# Patient Record
Sex: Female | Born: 1975 | Race: White | Hispanic: No | Marital: Married | Smoking: Current every day smoker
Health system: Southern US, Academic
[De-identification: ages and names within clinical notes are randomized; demographics above are authoritative.]

## PROBLEM LIST (undated history)

## (undated) DIAGNOSIS — K602 Anal fissure, unspecified: Secondary | ICD-10-CM

## (undated) DIAGNOSIS — R001 Bradycardia, unspecified: Secondary | ICD-10-CM

## (undated) DIAGNOSIS — Z973 Presence of spectacles and contact lenses: Secondary | ICD-10-CM

## (undated) DIAGNOSIS — F32A Depression, unspecified: Secondary | ICD-10-CM

## (undated) DIAGNOSIS — E039 Hypothyroidism, unspecified: Secondary | ICD-10-CM

## (undated) DIAGNOSIS — Z853 Personal history of malignant neoplasm of breast: Secondary | ICD-10-CM

## (undated) DIAGNOSIS — J45909 Unspecified asthma, uncomplicated: Secondary | ICD-10-CM

## (undated) DIAGNOSIS — R000 Tachycardia, unspecified: Secondary | ICD-10-CM

## (undated) DIAGNOSIS — J309 Allergic rhinitis, unspecified: Secondary | ICD-10-CM

## (undated) DIAGNOSIS — R0602 Shortness of breath: Secondary | ICD-10-CM

## (undated) DIAGNOSIS — G4730 Sleep apnea, unspecified: Secondary | ICD-10-CM

## (undated) DIAGNOSIS — I499 Cardiac arrhythmia, unspecified: Secondary | ICD-10-CM

## (undated) DIAGNOSIS — E079 Disorder of thyroid, unspecified: Secondary | ICD-10-CM

## (undated) DIAGNOSIS — K219 Gastro-esophageal reflux disease without esophagitis: Secondary | ICD-10-CM

## (undated) DIAGNOSIS — T148XXA Other injury of unspecified body region, initial encounter: Secondary | ICD-10-CM

## (undated) DIAGNOSIS — Z87898 Personal history of other specified conditions: Secondary | ICD-10-CM

## (undated) DIAGNOSIS — G4710 Hypersomnia, unspecified: Secondary | ICD-10-CM

## (undated) DIAGNOSIS — I10 Essential (primary) hypertension: Secondary | ICD-10-CM

## (undated) DIAGNOSIS — G4700 Insomnia, unspecified: Secondary | ICD-10-CM

## (undated) DIAGNOSIS — E669 Obesity, unspecified: Secondary | ICD-10-CM

## (undated) DIAGNOSIS — Z9989 Dependence on other enabling machines and devices: Secondary | ICD-10-CM

## (undated) HISTORY — DX: Gastro-esophageal reflux disease without esophagitis: K21.9

## (undated) HISTORY — PX: HX TONSILLECTOMY: SHX27

## (undated) HISTORY — DX: Personal history of malignant neoplasm of breast: Z85.3

## (undated) HISTORY — DX: Other injury of unspecified body region, initial encounter: T14.8XXA

## (undated) HISTORY — DX: Presence of spectacles and contact lenses: Z97.3

## (undated) HISTORY — DX: Hypersomnia, unspecified: G47.10

## (undated) HISTORY — PX: HX LITHOTRIPSY: SHX66

## (undated) HISTORY — DX: Sleep apnea, unspecified: G47.30

## (undated) HISTORY — DX: Unspecified asthma, uncomplicated: J45.909

## (undated) HISTORY — PX: PORTACATH PLACEMENT: SHX2246

## (undated) HISTORY — DX: Dependence on other enabling machines and devices: Z99.89

## (undated) HISTORY — DX: Personal history of other specified conditions: Z87.898

## (undated) HISTORY — PX: HX TUBAL LIGATION: SHX77

## (undated) HISTORY — DX: Depression, unspecified: F32.A

## (undated) HISTORY — DX: Obesity, unspecified: E66.9

## (undated) HISTORY — DX: Anal fissure, unspecified: K60.2

## (undated) HISTORY — DX: Bradycardia, unspecified: R00.1

## (undated) HISTORY — DX: Shortness of breath: R06.02

## (undated) HISTORY — DX: Insomnia, unspecified: G47.00

## (undated) HISTORY — PX: HX HYSTERECTOMY: SHX81

## (undated) HISTORY — DX: Tachycardia, unspecified: R00.0

## (undated) HISTORY — DX: Essential (primary) hypertension: I10

## (undated) HISTORY — DX: Allergic rhinitis, unspecified: J30.9

## (undated) HISTORY — DX: Cardiac arrhythmia, unspecified: I49.9

## (undated) HISTORY — DX: Hypothyroidism, unspecified: E03.9

## (undated) HISTORY — PX: HX BREAST BIOPSY: SHX20

## (undated) HISTORY — DX: Disorder of thyroid, unspecified: E07.9

## (undated) HISTORY — PX: HX HAND SURGERY: 2100001299

## (undated) HISTORY — PX: HX BACK SURGERY: SHX140

## (undated) NOTE — Anesthesia Postprocedure Evaluation (Signed)
Formatting of this note is different from the original.  Anesthesia Post Evaluation    Patient location during evaluation: PACU  Patient participation: complete - patient participated  Level of consciousness: awake  Pain management: adequate  Airway patency: patent    Anesthetic complications: no  Cardiovascular status: hemodynamically stable  Respiratory status: acceptable and nasal cannula (2L NC)  Hydration status: euvolemic  Post op PONV: No PONV    Vitals Value Taken Time   BP 123/91 08/09/20 1341   Temp 97.8 F (36.6 C) 08/09/20 1230   Pulse     Resp 13 08/09/20 1341   SpO2 100 % 08/09/20 1341   Heart Rate 87 bpm 08/09/20 1341   Vitals shown include unvalidated device data.      Electronically signed by Sindy Messing, MD at 08/09/2020  1:43 PM EDT

## (undated) NOTE — Anesthesia Preprocedure Evaluation (Signed)
Formatting of this note is different from the original.  Anesthesia Pre-procedure Evaluation  RIGHT L4 TRANFORAMINAL LUMBAR INTERBODY FUSION WITH EXTENSION OF PROCEDURE AS INDICATED BY OPERATIVE FINDINGS (Right Back)  X (Right Back)  X (Right )  X (Right Back)  X (Right )  X (Right Back)  X (Right Back)    NPO Criteria Met: yes    ROS/MED HX    Hx of  Anesthesia Complications - negative      Family HX of Anesthesia  Complications - negative    Infectious Disease - Negative     Airway / ENT - Negative     Social History    + tobacco use      Cigarettes: current    OTHER ROS/MED HISTORY: Denies cp or sob-pt does not exercise    Cardiovascular       Exercise Tolerance: >4 METS   + hypertension - well controlled    Pulmonary:    + asthma -controlled    Endocrine   + hypothyroidism    Neuro/Psych: - negative    Musculoskeletal    + back pain   + obesity -class III obesity    GI/GU/Hepatic/Renal   + GERD - well controlled   + renal disease - stone disease    Hematology / Oncology  - negative     OB/GYN    + hysterectomy          Physical Exam     Mental Status / LOC: alert & oriented    Airway:  Mallampati: II  TM distance: >3 FB  Neck ROM: full  Mouth Opening: adequate    Dental Exam: no notable dental hx    Cardiovascular:   Rhythm: regular  Rate: normal    Pulmonary:  Breath Sounds: clear to auscultation    Anesthesia Plan      ASA: 3    Plan    General    Airway Management  Endotracheal tube    Other Available Equipment  - GlideScope    Informed Consent   - Anesthetic plan and risks discussed with:  Patient  - Use of blood products discussed with: patient     - Agrees to anesthetic plan and all questions answered        - Discussed plan with : CRNA    - Patient did not smoke on day of surgery    Other comments:    Electronically signed by Tania Ade, CRNA at 08/09/2020  6:30 AM EDT

---
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
## 2015-03-15 ENCOUNTER — Ambulatory Visit (HOSPITAL_COMMUNITY)
Admission: RE | Admit: 2015-03-15 | Discharge: 2015-03-15 | Disposition: A | Discharge status: Home / Self Care | Payer: Self-pay | Source: Ambulatory Visit

## 2019-09-22 IMAGING — MR MRI LUMBAR SPINE WITHOUT CONTRAST
6 series · 43 of 48 positions shown · IV contrast (gadolinium)
Comparison: None available.

EXAM:  MRI LUMBAR SPINE WITHOUT CONTRAST
INDICATION: Lower back pain with bilateral lower extremity radiculopathy.
TECHNIQUE: Multiplanar multisequential MRI of the lumbar spine was performed without gadolinium contrast.

[Series 11: T2 · sagittal · 4.0mm · 0.94mm/px · 5 of 13 slices shown (1 of 3)]
[im 1/13]
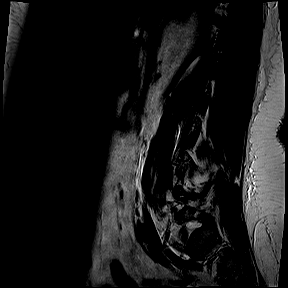
[im 4/13]
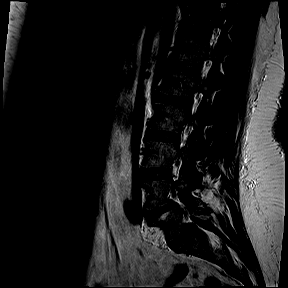
[im 7/13]
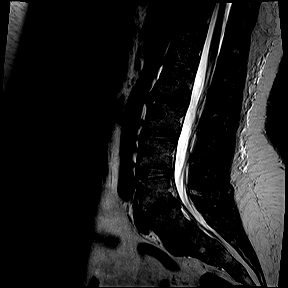
[im 10/13]
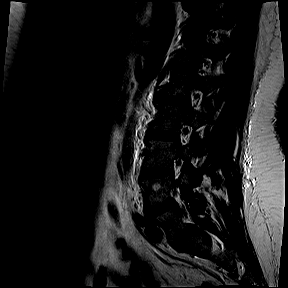
[im 13/13]
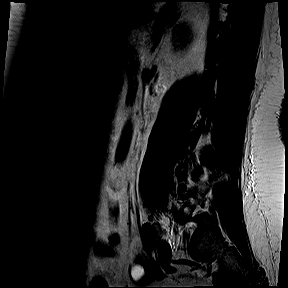

[Series 13: T1 · sagittal · 4.0mm · 0.94mm/px · 6 of 13 slices shown (1 of 2)]
[im 1/13]
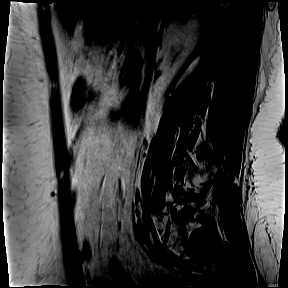
[im 3/13]
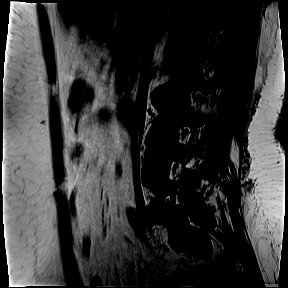
[im 5/13]
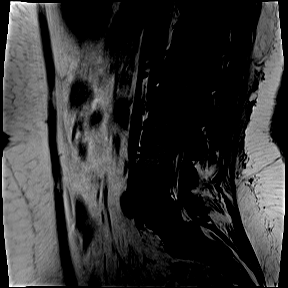
[im 8/13]
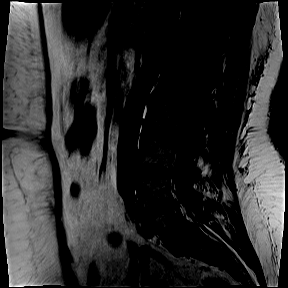
[im 10/13]
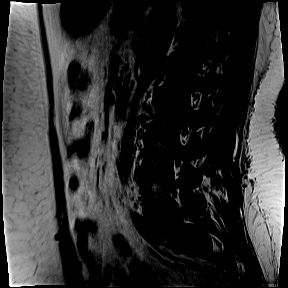
[im 13/13]
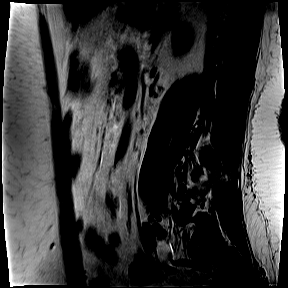

[Series 14: T2 · oblique · 4.0mm · 0.47mm/px · 11 of 23 slices shown (2 of 3)]
[im 1/23]
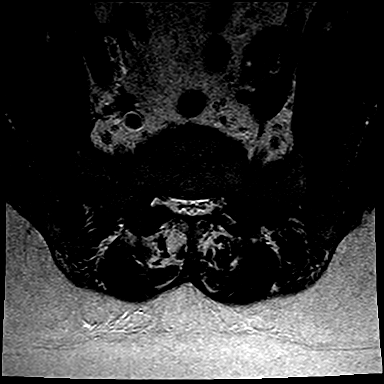
[im 3/23]
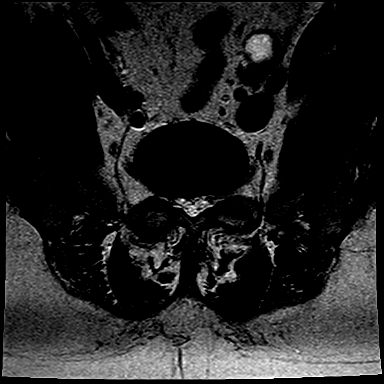
[im 5/23]
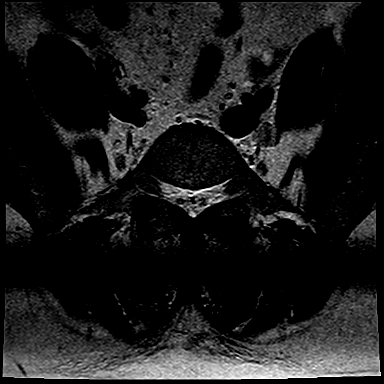
[im 7/23]
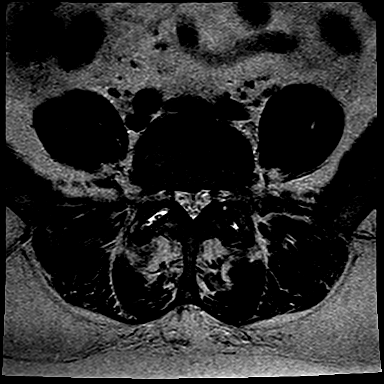
[im 9/23]
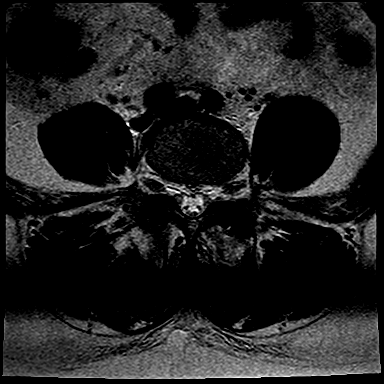
[im 12/23]
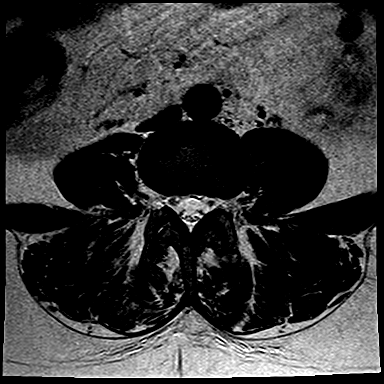
[im 14/23]
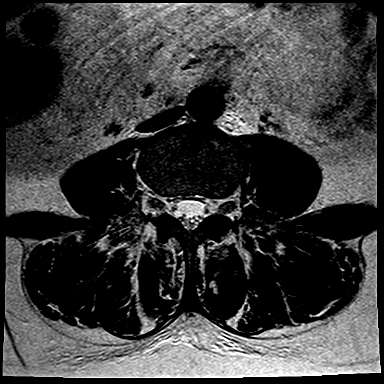
[im 16/23]
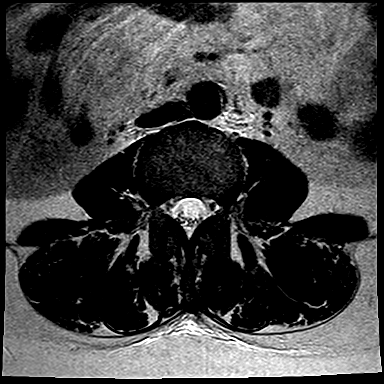
[im 18/23]
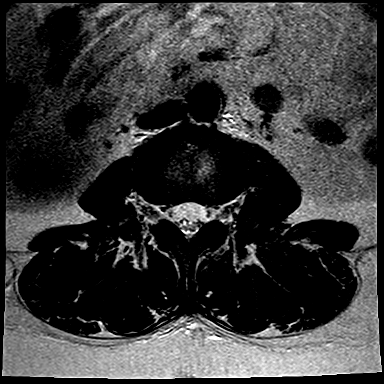
[im 20/23]
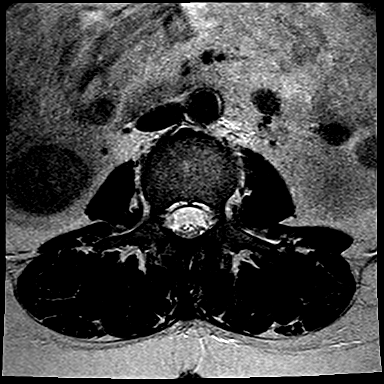
[im 23/23]
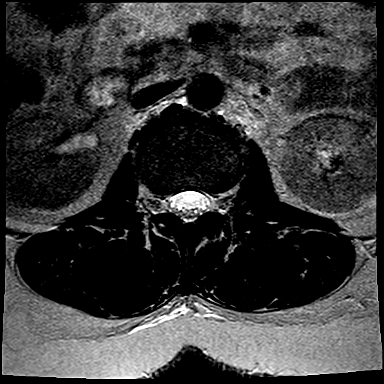

[Series 15: T1 · oblique · 4.0mm · 0.47mm/px · 8 of 23 slices shown (2 of 2)]
[im 1/23]
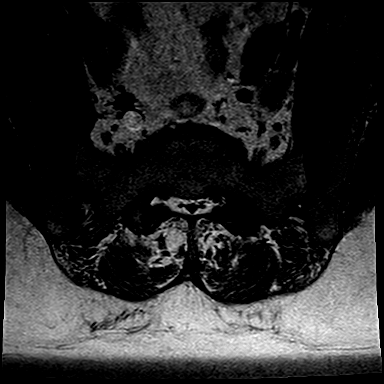
[im 5/23]
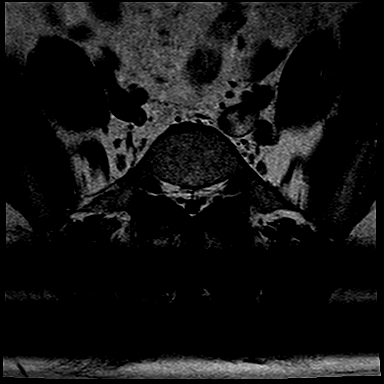
[im 7/23]
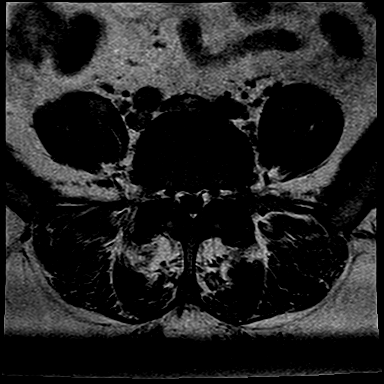
[im 9/23]
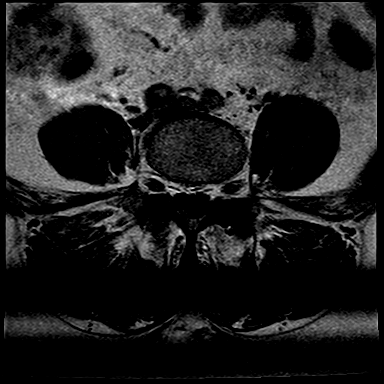
[im 14/23]
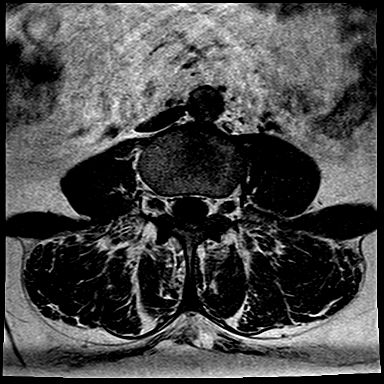
[im 16/23]
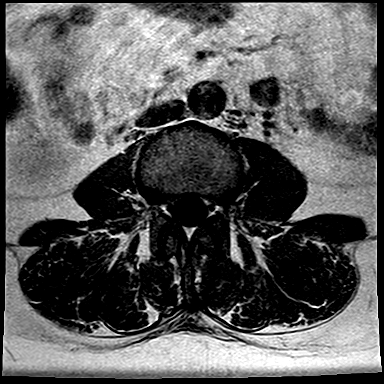
[im 18/23]
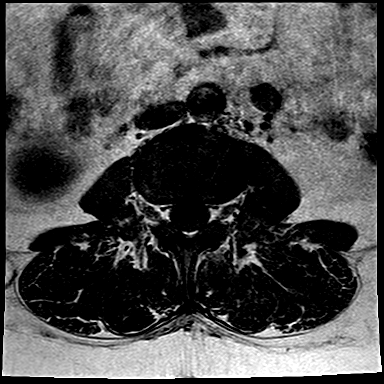
[im 23/23]
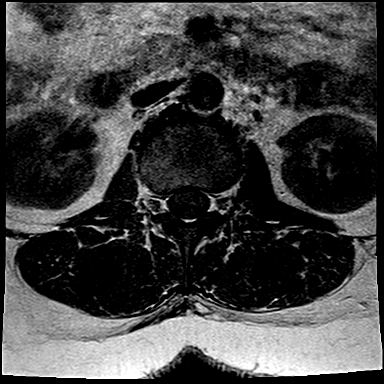

[Series 16: STIR · sagittal · 4.0mm · 1.05mm/px · 4 of 13 slices shown]
[im 1/13]
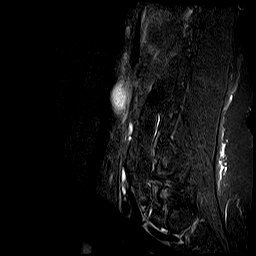
[im 3/13]
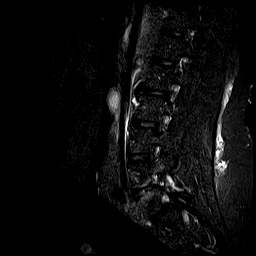
[im 5/13]
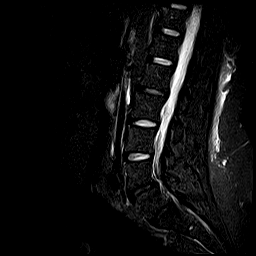
[im 8/13]
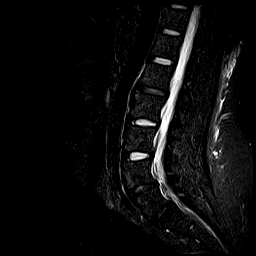

[Series 17: T2 · coronal · 4.0mm · 1.15mm/px · 9 of 20 slices shown (3 of 3)]
[im 1/20]
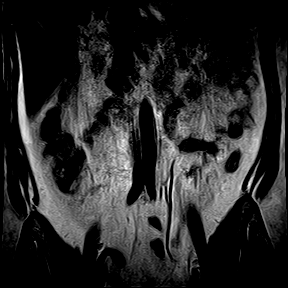
[im 3/20]
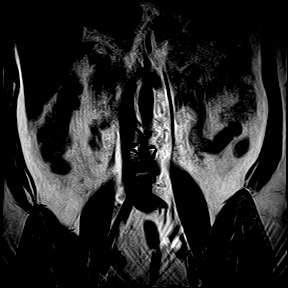
[im 5/20]
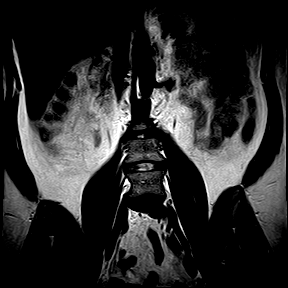
[im 8/20]
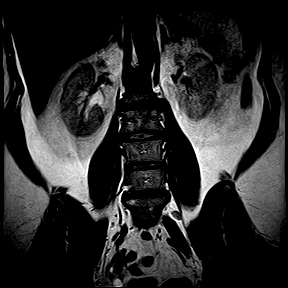
[im 10/20]
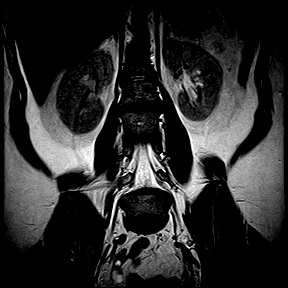
[im 12/20]
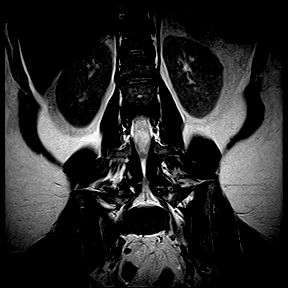
[im 15/20]
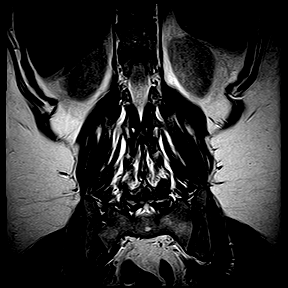
[im 17/20]
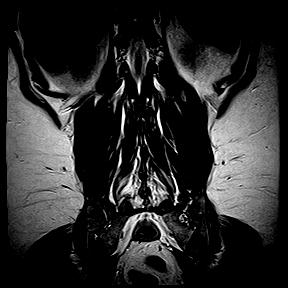
[im 20/20]
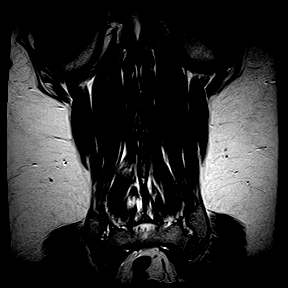

[43 of 48 positions shown; findings below may reference images not displayed]

FINDINGS: Bone marrow signal intensity is normal. There is no acute fracture or subluxation. Distal spinal cord is normal in signal intensity and terminates normally at T12 vertebral body level. Spinal canal is congenitally narrow.

L1-2, L2-3 and L3-4 levels are unremarkable.

At L4-5 level, there is minimal anterolisthesis of L4 on L5 vertebral body. There is a minimal bulging annulus without mass effect on the thecal sac. There is mild bilateral neural foraminal stenosis from facet arthropathy and bulging annulus without nerve root impingement.

At L5-S1 level, there is a minimal bulging annulus without mass effect on the thecal sac. There is moderate to severe spinal stenosis from epidural lipomatosis. There is also moderate to severe left and mild right neural foraminal stenosis from facet arthropathy.

Paraspinal soft tissues are unremarkable.
IMPRESSION: Minimal anterolisthesis of L4 on L5 vertebral body. 

No significant disc herniation at any level. 

Moderate to severe spinal stenosis at L5-S1 level from epidural lipomatosis. 

Multilevel neural foraminal stenosis as detailed above.

## 2020-08-11 DIAGNOSIS — J4540 Moderate persistent asthma, uncomplicated: Secondary | ICD-10-CM | POA: Insufficient documentation

## 2020-08-11 DIAGNOSIS — R000 Tachycardia, unspecified: Secondary | ICD-10-CM | POA: Insufficient documentation

## 2020-09-19 DIAGNOSIS — E039 Hypothyroidism, unspecified: Secondary | ICD-10-CM | POA: Insufficient documentation

## 2020-09-19 DIAGNOSIS — R001 Bradycardia, unspecified: Secondary | ICD-10-CM | POA: Insufficient documentation

## 2021-03-12 IMAGING — MR MRI LUMBAR SPINE WITHOUT CONTRAST
6 series · 48 of 48 positions shown · non-contrast
Comparison: MRI lumbosacral spine dated 09/12/2019.

﻿EXAM:  [id number]   MRI LUMBAR SPINE WITHOUT CONTRAST
INDICATION: Persistent back pain.  History of back surgery 7 months ago.
TECHNIQUE: Axial, coronal and sagittal images were obtained in T1, T2 and STIR sequences.  Contrast series was not ordered.

[Series 7: T2 · sagittal · 5.0mm · 1.00mm/px · 5 of 13 slices shown (1 of 3)]
[im 1/13]
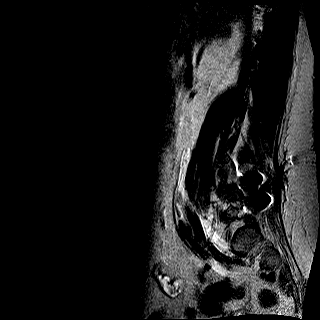
[im 4/13]
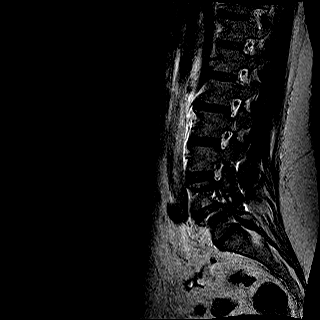
[im 7/13]
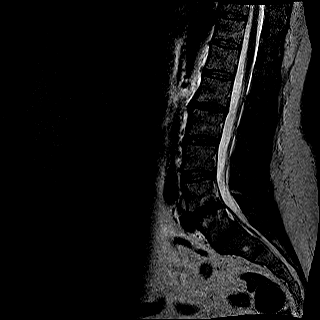
[im 10/13]
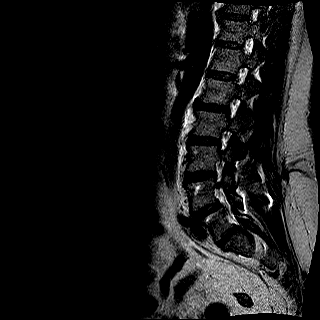
[im 13/13]
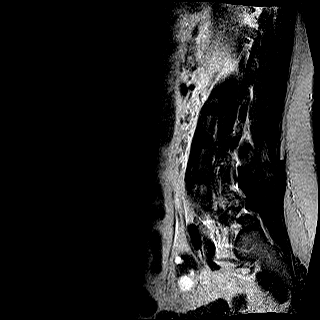

[Series 8: T1 · sagittal · 5.0mm · 1.00mm/px · 6 of 13 slices shown (1 of 2)]
[im 1/13]
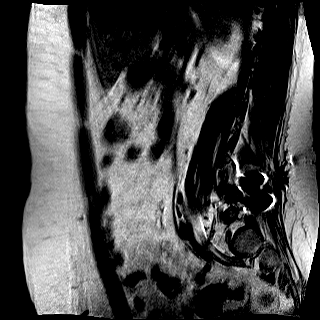
[im 3/13]
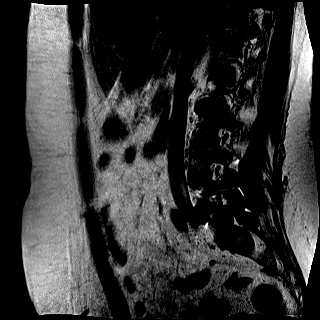
[im 5/13]
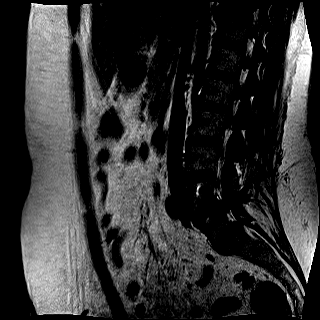
[im 8/13]
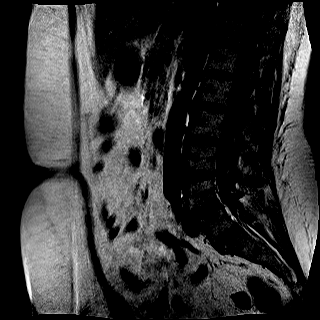
[im 10/13]
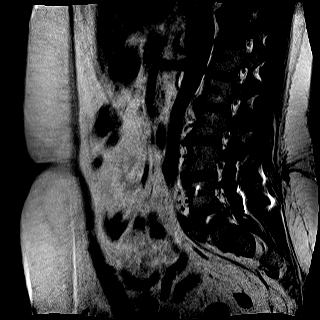
[im 13/13]
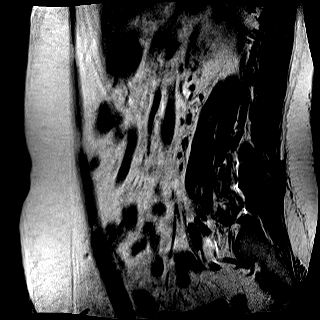

[Series 9: STIR · sagittal · 5.0mm · 1.25mm/px · 6 of 13 slices shown]
[im 1/13]
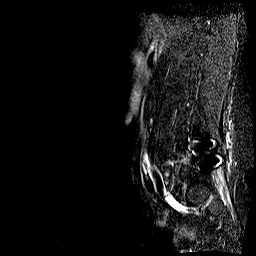
[im 3/13]
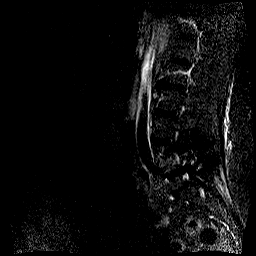
[im 5/13]
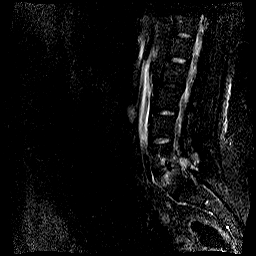
[im 8/13]
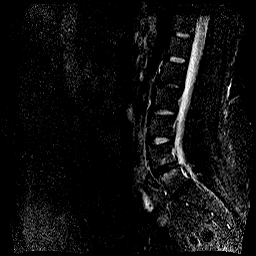
[im 10/13]
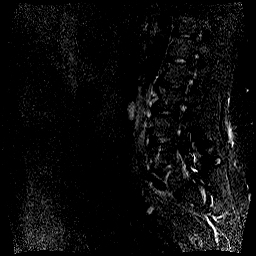
[im 13/13]
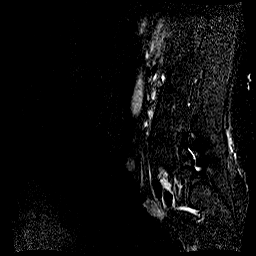

[Series 10: T2 · oblique · 5.0mm · 0.89mm/px · 11 of 25 slices shown (2 of 3)]
[im 1/25]
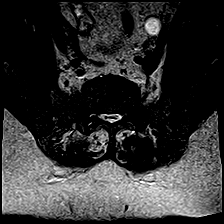
[im 3/25]
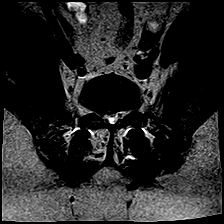
[im 5/25]
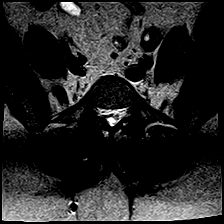
[im 8/25]
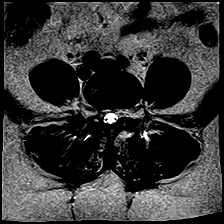
[im 10/25]
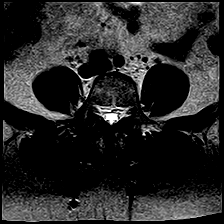
[im 13/25]
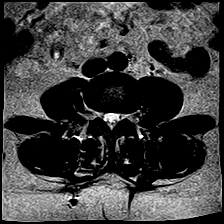
[im 15/25]
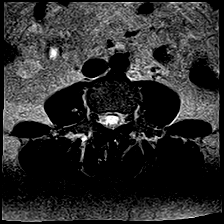
[im 17/25]
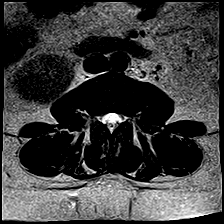
[im 20/25]
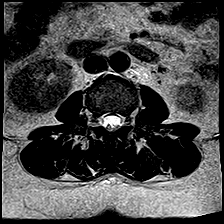
[im 22/25]
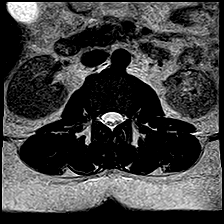
[im 25/25]
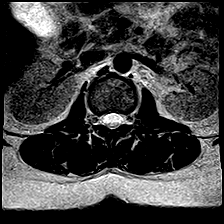

[Series 11: T1 · oblique · 5.0mm · 0.89mm/px · 11 of 25 slices shown (2 of 2)]
[im 1/25]
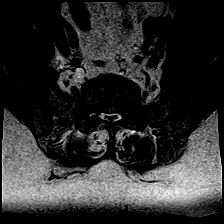
[im 3/25]
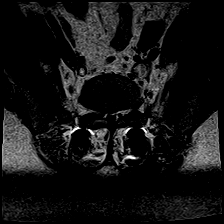
[im 5/25]
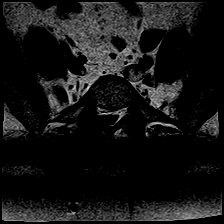
[im 8/25]
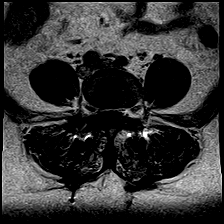
[im 10/25]
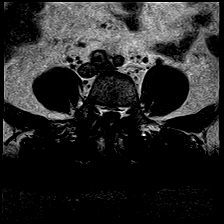
[im 13/25]
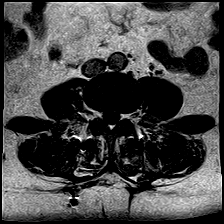
[im 15/25]
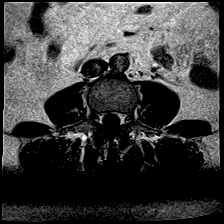
[im 17/25]
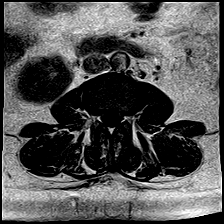
[im 20/25]
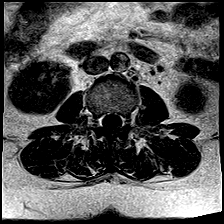
[im 22/25]
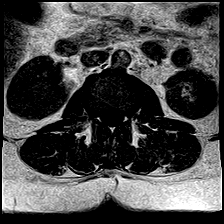
[im 25/25]
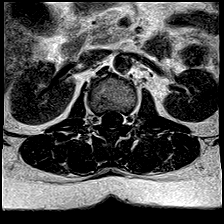

[Series 12: T2 · coronal · 4.0mm · 1.34mm/px · 9 of 20 slices shown (3 of 3)]
[im 1/20]
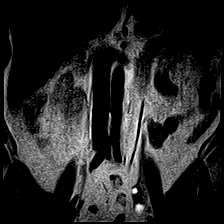
[im 3/20]
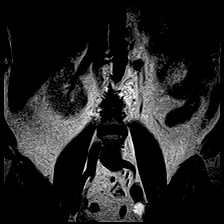
[im 5/20]
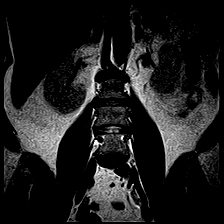
[im 8/20]
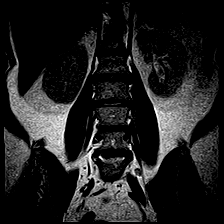
[im 10/20]
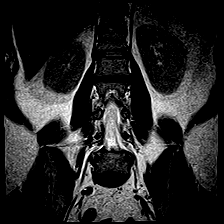
[im 12/20]
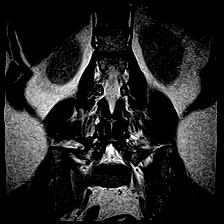
[im 15/20]
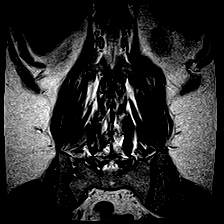
[im 17/20]
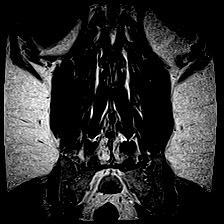
[im 20/20]
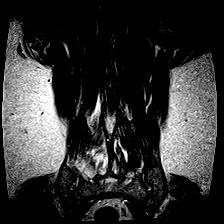

[48 of 48 positions shown; findings below may reference images not displayed]

FINDINGS: No acute bone changes of lumbar vertebrae are seen.  Lower spinal cord and cauda equina are normal.  

At L1-L2 level, no focal disc pathology.  

At L2-L3 and L3-L4 levels, no focal disc lesions are seen.  Mild compromise of neural foramina due to bulging annulus at L3-L4 level. 

At L4-L5 level, interval postsurgical changes compared with prior MRI of 09/22/2019 with posterior fixation device.  No evidence of epidural abscess is noted.  No evidence of central spinal stenosis is noted.  To the extent visualized, no foraminal stenosis is seen. 

Degenerative changes at L5-S1 level as before.  Paravertebral soft tissues are unremarkable.
IMPRESSION: 1. No acute bone changes. 

2. Interval postsurgical changes at L4-L5 level with fixation device.  Normal alignment.  No abnormal epidural collections, spinal stenosis or foraminal stenosis is visible.  

3. Paravertebral soft tissues are unremarkable.

## 2021-05-01 ENCOUNTER — Encounter (RURAL_HEALTH_CENTER): Payer: Self-pay | Admitting: Family

## 2021-05-01 DIAGNOSIS — G4710 Hypersomnia, unspecified: Secondary | ICD-10-CM | POA: Insufficient documentation

## 2021-05-01 DIAGNOSIS — I10 Essential (primary) hypertension: Secondary | ICD-10-CM | POA: Insufficient documentation

## 2021-05-01 DIAGNOSIS — R5383 Other fatigue: Secondary | ICD-10-CM | POA: Insufficient documentation

## 2021-05-01 DIAGNOSIS — K219 Gastro-esophageal reflux disease without esophagitis: Secondary | ICD-10-CM | POA: Insufficient documentation

## 2021-05-01 DIAGNOSIS — E669 Obesity, unspecified: Secondary | ICD-10-CM

## 2021-06-06 ENCOUNTER — Other Ambulatory Visit: Payer: Self-pay

## 2021-06-06 ENCOUNTER — Other Ambulatory Visit: Payer: Self-pay | Attending: Family | Admitting: Family

## 2021-06-06 ENCOUNTER — Ambulatory Visit (RURAL_HEALTH_CENTER): Payer: Commercial Managed Care - PPO | Attending: Family | Admitting: Family

## 2021-06-06 ENCOUNTER — Other Ambulatory Visit (RURAL_HEALTH_CENTER): Payer: Self-pay

## 2021-06-06 ENCOUNTER — Encounter (RURAL_HEALTH_CENTER): Payer: Self-pay | Admitting: Family

## 2021-06-06 VITALS — BP 116/77 | HR 92 | Resp 18 | Ht 65.0 in | Wt 255.5 lb

## 2021-06-06 DIAGNOSIS — E039 Hypothyroidism, unspecified: Secondary | ICD-10-CM

## 2021-06-06 DIAGNOSIS — K649 Unspecified hemorrhoids: Secondary | ICD-10-CM | POA: Insufficient documentation

## 2021-06-06 DIAGNOSIS — M5441 Lumbago with sciatica, right side: Secondary | ICD-10-CM | POA: Insufficient documentation

## 2021-06-06 DIAGNOSIS — R001 Bradycardia, unspecified: Secondary | ICD-10-CM

## 2021-06-06 DIAGNOSIS — E669 Obesity, unspecified: Secondary | ICD-10-CM | POA: Insufficient documentation

## 2021-06-06 DIAGNOSIS — F32A Depression, unspecified: Secondary | ICD-10-CM | POA: Insufficient documentation

## 2021-06-06 DIAGNOSIS — J4540 Moderate persistent asthma, uncomplicated: Secondary | ICD-10-CM

## 2021-06-06 DIAGNOSIS — Z136 Encounter for screening for cardiovascular disorders: Secondary | ICD-10-CM

## 2021-06-06 DIAGNOSIS — Z87442 Personal history of urinary calculi: Secondary | ICD-10-CM | POA: Insufficient documentation

## 2021-06-06 DIAGNOSIS — N200 Calculus of kidney: Secondary | ICD-10-CM | POA: Insufficient documentation

## 2021-06-06 DIAGNOSIS — Z87891 Personal history of nicotine dependence: Secondary | ICD-10-CM | POA: Insufficient documentation

## 2021-06-06 DIAGNOSIS — Z6841 Body Mass Index (BMI) 40.0 and over, adult: Secondary | ICD-10-CM | POA: Insufficient documentation

## 2021-06-06 DIAGNOSIS — I10 Essential (primary) hypertension: Secondary | ICD-10-CM

## 2021-06-06 DIAGNOSIS — R000 Tachycardia, unspecified: Secondary | ICD-10-CM | POA: Insufficient documentation

## 2021-06-06 DIAGNOSIS — F419 Anxiety disorder, unspecified: Secondary | ICD-10-CM | POA: Insufficient documentation

## 2021-06-06 DIAGNOSIS — K219 Gastro-esophageal reflux disease without esophagitis: Secondary | ICD-10-CM | POA: Insufficient documentation

## 2021-06-06 DIAGNOSIS — J45909 Unspecified asthma, uncomplicated: Secondary | ICD-10-CM | POA: Insufficient documentation

## 2021-06-06 DIAGNOSIS — G8929 Other chronic pain: Secondary | ICD-10-CM

## 2021-06-06 DIAGNOSIS — G4700 Insomnia, unspecified: Secondary | ICD-10-CM | POA: Insufficient documentation

## 2021-06-06 DIAGNOSIS — G4733 Obstructive sleep apnea (adult) (pediatric): Secondary | ICD-10-CM | POA: Insufficient documentation

## 2021-06-06 DIAGNOSIS — M545 Low back pain, unspecified: Secondary | ICD-10-CM | POA: Insufficient documentation

## 2021-06-06 DIAGNOSIS — Z1231 Encounter for screening mammogram for malignant neoplasm of breast: Secondary | ICD-10-CM

## 2021-06-06 DIAGNOSIS — K602 Anal fissure, unspecified: Secondary | ICD-10-CM | POA: Insufficient documentation

## 2021-06-06 HISTORY — DX: Calculus of kidney: N20.0

## 2021-06-06 MED ORDER — AZITHROMYCIN 250 MG TABLET
ORAL_TABLET | ORAL | 0 refills | Status: DC
Start: 2021-06-06 — End: 2021-09-10

## 2021-06-06 MED ORDER — LORATADINE 10 MG TABLET
10.0000 mg | ORAL_TABLET | Freq: Every day | ORAL | 1 refills | Status: DC
Start: 2021-06-06 — End: 2021-09-10

## 2021-06-06 MED ORDER — HYDROCHLOROTHIAZIDE 25 MG TABLET
25.0000 mg | ORAL_TABLET | Freq: Every day | ORAL | 1 refills | Status: DC
Start: 2021-06-06 — End: 2021-09-10

## 2021-06-06 MED ORDER — OMEPRAZOLE 20 MG CAPSULE,DELAYED RELEASE
20.0000 mg | DELAYED_RELEASE_CAPSULE | Freq: Every day | ORAL | 1 refills | Status: DC
Start: 2021-06-06 — End: 2021-09-10

## 2021-06-06 MED ORDER — PREDNISONE 10 MG TABLET
10.0000 mg | ORAL_TABLET | Freq: Two times a day (BID) | ORAL | 0 refills | Status: DC
Start: 2021-06-06 — End: 2021-09-10

## 2021-06-06 MED ORDER — LOSARTAN 50 MG TABLET
50.0000 mg | ORAL_TABLET | Freq: Every day | ORAL | 1 refills | Status: DC
Start: 2021-06-06 — End: 2021-09-10

## 2021-06-06 MED ORDER — MONTELUKAST 10 MG TABLET
10.0000 mg | ORAL_TABLET | Freq: Every day | ORAL | 1 refills | Status: DC
Start: 2021-06-06 — End: 2021-09-10

## 2021-06-06 MED ORDER — LEVOTHYROXINE 125 MCG TABLET
125.0000 ug | ORAL_TABLET | Freq: Every day | ORAL | 1 refills | Status: DC
Start: 2021-06-06 — End: 2021-09-10

## 2021-06-06 MED ORDER — ALBUTEROL SULFATE HFA 90 MCG/ACTUATION AEROSOL INHALER
1.0000 | INHALATION_SPRAY | Freq: Four times a day (QID) | RESPIRATORY_TRACT | 4 refills | Status: DC | PRN
Start: 2021-06-06 — End: 2021-09-10

## 2021-06-06 MED ORDER — DILTIAZEM CD 120 MG CAPSULE,EXTENDED RELEASE 24 HR
120.0000 mg | ORAL_CAPSULE | Freq: Every day | ORAL | 1 refills | Status: DC
Start: 2021-06-06 — End: 2021-09-10

## 2021-06-06 NOTE — Progress Notes (Signed)
[facility]  [address]  [place], [place]  [address]  Phone: [phone] Fax: [phone]    Name: DERRIANA OSER                       Date of Birth: [birth year]   MRN:  [id number]                         Date of visit: 06/06/2021     Problem List Items Addressed This Visit    None      Chief Complaint: Follow Up 3 Months    Past Medical History  Current Outpatient Medications   Medication Sig   . albuterol sulfate (PROVENTIL OR VENTOLIN OR PROAIR) 90 mcg/actuation Inhalation oral inhaler Take 1-2 Puffs by inhalation Every 6 hours as needed   . dilTIAZem (CARDIZEM CD) 120 mg Oral Capsule, Sust. Release 24 hr Take 1 Capsule (120 mg total) by mouth Once a day   . gabapentin (NEURONTIN) 300 mg Oral Capsule Take 1 Capsule (300 mg total) by mouth Three times a day   . hydroCHLOROthiazide (HYDRODIURIL) 25 mg Oral Tablet Take 1 Tablet (25 mg total) by mouth Once a day   . levothyroxine (SYNTHROID) 125 mcg Oral Tablet Take 1 Tablet (125 mcg total) by mouth Once a day   . loratadine (CLARITIN) 10 mg Oral Tablet Take 1 Tablet (10 mg total) by mouth Once a day   . losartan (COZAAR) 50 mg Oral Tablet Take 1 Tablet (50 mg total) by mouth Once a day   . montelukast (SINGULAIR) 10 mg Oral Tablet Take 1 Tablet (10 mg total) by mouth Once a day   . nystatin (MYCOSTATIN) 100,000 unit/mL Oral Suspension Swish and Swallow 5 mL Four times a day   . omeprazole (PRILOSEC) 20 mg Oral Capsule, Delayed Release(E.C.) Take 1 Capsule (20 mg total) by mouth Once a day   . sertraline (ZOLOFT) 100 mg Oral Tablet Take 1 Tablet (100 mg total) by mouth Once a day (Patient not taking: Reported on 06/06/2021)     Allergies   Allergen Reactions   . Tamsulosin Nausea/ Vomiting     Past Medical History:   Diagnosis Date   . Allergic rhinitis    . Anal fissure    . Asthma    . Bradycardia    . Depression    . Esophageal reflux    . Hypersomnia    . Hypertension    . Hypothyroidism    . Insomnia, unspecified    . Obesity, unspecified    . Sleep  apnea    . Tachycardia, unspecified          Past Surgical History:   Procedure Laterality Date   . HX BACK SURGERY     . HX HYSTERECTOMY     . HX TONSILLECTOMY     . HX TUBAL LIGATION Bilateral          Family Medical History:     Problem Relation (Age of Onset)    Asthma Brother, Maternal Grandmother    Diabetes type II Father, Paternal Grandmother    Elevated Lipids Father    Heart Disease Father    Hypertension (High Blood Pressure) Mother, Father          Social History     Socioeconomic History   . Marital status: Married   Tobacco Use   . Smoking status: Former     Types: Cigarettes  Start date: 04/08/1986     Quit date: 04/08/2018     Years since quitting: 3.1     Passive exposure: Never   . Smokeless tobacco: Never   Vaping Use   . Vaping Use: Never used   Substance and Sexual Activity   . Alcohol use: Yes     Comment: occasionally   . Drug use: Never          Patient Active Problem List    Diagnosis Date Noted   . Essential hypertension, malignant 05/01/2021   . Hypersomnia, unspecified 05/01/2021   . Fatigue 05/01/2021   . Obesity, unspecified 05/01/2021   . Gastroesophageal reflux disease 05/01/2021   . Hypothyroidism 09/19/2020   . Bradycardia 09/19/2020   . Tachycardia 08/11/2020   . Moderate persistent asthma without complication 27/51/7001         Subjective:   Rachel Mendoza is a 46 y.o.  female that presents today for routine f/u and CDM.      ROS:  10 systems reviewed and were negative except as noted.     HPI:    Pain right SI joint, lower back, and right sciatica: Patient reports longstanding history of chronic pain in the right SI joint.  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  Presented initially with increased pain over the last several months with numbness and burning sensation down the right leg.  Positive straight leg raise.  Positive for tenderness at L4-L5 and extreme tenderness in the right SI joint.  07/06/2019: X-ray right hip and pelvis: Increased  sclerosis symphysis pubis otherwise unremarkable pelvis and right hip x-ray.  X-ray LS spine:6 non rib-bearing lumbar vertebra the lower most is labeled L5 in this report. Mild retrolisthesis of L5 relative to L4 with narrowing in the L4-S1 discs and facet arthropathy. No definite acute bone abnormality.    Physical therapy initiated with some improvement.  Unable to walk or stand for sustained periods of time.  Amitriptyline initiated for nerve pain, depression, and insomnia.  Ultimately this was discontinued d/t increased agitation.    09/22/19: MRI LS spine: Minimal anteriolisthesis L4 and L5 vertebrae.  Negative for disc herniation at any level.  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  Multilevel neural foraminal stenosis.  Patient is now being followed by neurosurgery in [place].   Epidural steroid injections attempted without success.    Admission to [facility] from 08/09/2020 through 08/15/20.  08/09/2020: Right lumbar 4 transforaminal interbody fusion, pedicle screw instrumentation completed by Dr. Gomez Cleverly at [facility].   She did have episode of tachycardia during hospitalization with rate up to 160.  Started on Cardizem.    08/29/2020:  Presents today postoperatively.  Reports she has already had staples removed and incision line is healing well.   Currently wearing brace support.     01/09/2021:  Patient called in August requesting order for PT due to increased pain in lower back and pain in right leg.   Dr. Jacklynn Ganong has retired and recommended that all of his patients find providers in the [place] area but he did not give recommendations or referrals.  She reports she is supposed to f/u at 6 months, which will be in November for repeat imaging and neurosurgical f/u.  Needs referral.   Referral to [facility] submitted and completed.       03/07/2021:  Referral submitted to [facility].  They requested repeat  MRI prior to referral  which has been requested and remains pending.    03/12/2021:  [facility]  Interval postsurgical changes at L4-5 level with fixation device.  Normal alignment  No abnormal epidural collections, spinal stenosis or foraminal stenosis is visible.  Paravertebral soft tissues unremarkable.      06/06/2021:  Reports she is following at [facility].  No surgical concerns at this time.    Currently prescribed gabapentin.      Tachycardia/Bradycardia:   Patient previously reported continued concerns regarding bradycardia.  Reported that she was having episodes daily that would last from 30 seconds to 2 minutes.  Heart rate would drop to the 30s accompanied by chest pain, shortness of breath, and excessive yawning.  O2 saturations remained in the upper 90s during these episodes.  Spontaneous resolution.  She is unable to correlate these episodes with any other activities, medications, or activity.  EKGs in office have demonstrated normal sinus rhythm.  Attributed to alterations in TSH.  These bradycardic episodes tend to improve and then recur - no recent concerns.   More recently, she has also reported episodes of tachycardia, palpitations.   48 hour holter monitor ordered but patient did not complete.  During recent hospitalization, heart rate was noted to go up to 160s to 170s range, documented as sinus tachycardia.   Diltiazem was initiated with ultimate improvement.  Dose was titrated and she is currently on diltiazem 120 daily.   Heart rate today in the 60-70 range.   Currently following with Dr. Marcille Blanco regularly.      Obstructive sleep apnea:   Chronic concerns regarding fatigue and insomnia.  Prior to back surgery, routinely slept on her abdomen and of course is unable to do so now.  While sleeping on her back, she is noted increased snoring, increased insomnia, and increased daytime sleepiness.  Stop bang screening questionnaire completed: Positive responses to snoring, tired,  observation of apnea, and hypertension.  Also BMI greater than 35 and neck size of 17 inches in a female.  Epworth screening also completed demonstrating moderate risk of excessive sleepiness.  10/31/2020: Home sleep study completed demonstrating severe OSA.  APAP prescribed.  She is currently using her APAP but has had some difficulty with adjustment and toleration.  03/07/2021:  Reports overall tolerating well although she has not used for the last couple of weeks due to dental issue/back molar falling out.     COVID-19: Reports recent illness with COVID-19 in January 2022.  States that she had difficulty for several weeks and [facility]  Ultimately, symptoms resolved.  No lingering concerns.  Back surgery had to be postponed due to COVID-19 diagnosis.    Kidney stones:  No current symptoms or concerns.  11/25/2019:  Patient presented to ED on 11/19/19 with left flank pain.  She has a history of kidney stones and states she has passed multiple stones in the past that were larger than the one she currently has identified by CT scan.  She is unable to take tamsulosin.  She has been trying to force fluids at home.  Reports pain has moved to the lower pelvic region and is no longer located in the flank area.  She has had intermittent periods of nausea since initiation of flank pain.  She has had lithotripsy in the past.  She is not currently followed by urology and does not have an appointment.  11/19/19:  CT abdomen/pelvis:  Left obstructive uropathy  with left hydronephrosis secondary to a 5 mm urethral calculus at the L2-3 level.  KUB ordered but patient did not complete.    12/28/2019:   Patient presents today with complaint of urinary urgency, pelvic pain, frequency, and bladder spasms.  Reports that she finally was able to pass her kidney stone but over the last week or so has developed these symptoms.  Reports that she was taking frequent tub baths for relief of pain related to the kidney  stone and believes that she has developed a urinary tract infection.  Denies fever, flank pain, CVA tenderness, body aches or chills.  She has been taking Azo over-the-counter.   She did not have KUB completed after her last visit and reports that she feels the spasms are in the lower pelvis/bladder area and not related to stones.   Requests prescription for Diflucan if antibiotics are prescribed due to past history of yeast infections with antibiotic usage.  CLIA UA completed although patient has been taking AZO and results may not be accurate.  Urine was golden, trace protein, small amount of blood , positive nitrites.  We will submit for microscopic UA for confirmation.  Macrobid prescribed.   Microscopic UA: Negative.  Reflex culture not performed due to negative UA.    01/03/2020: Patient called with continued urinary symptoms. Antibiotic had been completed at that time. Advised to repeat UA and culture and have KUB - these recommendations were not completed.    01/27/2020:  Continues to have episodes of increased flank pain with dysuria and nausea.  The spasms spontaneously resolve and then recur.  She has not passed this stone.  KUB has not been completed.  Denies any dysuria, frequency or concerns today.   Appointment is scheduled with urology next week.     03/22/2020: Kidney stone passed the night before lithotripsy on 02/02/2020. Tolerated procedure well. No urinary symptoms at this time.    Hypothyroidism: Has a history of hypothyroidism since age 76.  Reports variability in TSH.  07/06/19:  TSH 16.03 Synthroid increased to 150 mcg daily from 125 mcg daily.  08/31/2019: TSH 1.05  09/29/2019: TSH 0.19, T4 5.6.  Dose decreased to 137 mcg daily.  11/25/2019:  TSH 1.63  01/27/2020:  TSH 0.26  Dose decreased to 125 mcg daily.   03/22/2020: TSH 0.73.  07/12/2020: Reports she accidentally was taking Synthroid 137 mcg daily.  Corrected this approximately 1 month ago and is back on 125 mcg daily.  07/12/2020: TSH  0.94  01/09/2021: TSH 1.13    Hypertension: Well-controlled on current regimen.  Patient on losartan daily and hydrochlorothiazide.  Chlorthalidone previously initiated due to continued hypertension.  Patient reported it made her drowsy and she was not taking regularly, this was changed to hydrochlorothiazide.     Asthma: Longstanding history of asthma.  Has albuterol inhaler that she uses as needed as well as nebulizer.  Reports that she has had much less asthma flares since Covid pandemic due to environmental controls in addition to quitting smoking.   Currently resumed smoking and is smoking 1/2 ppd.   She had issues with increased utilization of albuterol after her lithotripsy in October.  She has incentive spirometry at home.  These concerns have resolved.   She intermittently uses Monteleukast and budesonide.    Today, reports she has not been taking.     Allergic rhinitis: Currently on Claritin over-the-counter and Flonase once daily.     GERD: Currently on omeprazole 20 over-the-counter.  Reports this controls  symptoms but if she misses a dose that she has recurrent heartburn.  Denies any difficulty swallowing.    Obesity:  07/06/2019: Weight 251, BMI 41.8  12/28/2019: Weight 257, BMI 42.8  01/27/2020: Weight 253, BMI 42.1.  Congratulated on recent weight loss.  She is currently using itrack bites to track her oral intake.   03/22/2020:  Weight 252, BMI 42  07/12/2020: Weight 253, BMI 42  08/29/2020:  Weight 259, BMI 43.1  01/09/2021: Weight 256, BMI 42.5  03/07/2021: Weight 256, BMI 42.7  06/06/2021:  Weight 255.8, BMI 42.6    Anal fissure/hemorrhoids: No current concerns.   Recent development of anal fissure.  Surgical referral submitted and patient saw Dr. Eunice Blase in January.   Reports past medical history of hemorrhoids.   Currently using Colace 1-2 times daily and Dulcolax without stimulant as needed for constipation.  Tried fiber supplement which worsen constipation.  Trying to ensure adequate fluid intake.   Takes sitz bath's as needed for significant pain and irritation.  Lidocaine cream prescribed but patient has not found much benefit with this until recently as anal fissure has begun to heal.  States that anal fissure and hemorrhoidal inflammation continue to improve.    Depression and anxiety:  She has struggled with symptoms of depression and anxiety over the last couple of years with multiple health issues.  Reports strong family history of bipolar disorder which she has suspected that she has as well.  Reports previous manic episodes but these have resolved as she has gotten older.  Now suffers with depression symptoms.  Denies SI or HI.  Reports lack of enjoyment in daily life.  Reports preferring to stay in the bed instead of participating in activities with her family.  States she has been tried on different medications in the past and what has worked the best has been Engineer, civil (consulting).  Effexor made symptoms worse.  Reports using various other agents which also were unsuccessful but she is unable to remember the names.  Discussed counseling services but she wishes to defer for now.  01/09/2021: Zoloft initiated at 50 mg with increase to 75 mg.  03/07/2021:  Reports improvement but incomplete resolution.  She is interested in further increasing dosage. Zoloft increased to 100 mg.    06/06/2021:  Zoloft weaned and she has been placed on gabapentin.  She reports this has helped with her depression as well as her pain.  Denies SI or HI.      Insomnia:  Reports recurrent, daily issues with insomnia.  She uses melatonin Gummies with some improvement.  Not currently interested in any additional medication management.    Influenza vaccination:  Reports she took the vaccine 4 years in a row previously and that each of the 4 years she became sick with flu-like symptoms following the vaccine and was very ill.  She no longer wishes to take this.       Physical Exam:    General: cooperative, healthy appearing and no acute  distress  Orientation/Consciousness: patient oriented x3  HENMT  Ears: hearing grossly normal bilaterally  Mouth: oral mucosae normal  Eyes  General: appearance normal, both eyes and all related structures  Neck  Neck: normal visual inspection and no lymphadenopathy  Resp  Effort & Inspection: normal respiratory effort  Auscultation: clear to auscultation bilaterally, no crackles and no wheezes  Cardio  Jugular venous pressure: no JVD  Palpation: normal PMI  Rate: regular rate  Rhythm: regular rhythm  Heart Sounds: S1 normal,  S2 normal, no click, no murmurs and no rubs  Pulses: normal peripheral pulses  GI  Inspection: Yes normal to inspection  Palpation: soft, no hepatosplenomegaly, no guarding, no masses and nontender  Auscultation: normal bowel sounds  Neuro  General: patient oriented x3  Psych  Mental Status: mental status grossly normal  Mood: congruent mood  Affect: normal affect  Insight: insight good  Judgment: judgment good    Data reviewed:    Assessment/Plan:    Right sacroiliac pain/lower back pain and right sciatica: MRI reviewed.   F/u with [facility] as scheduled.    Tachycardia/previous episodes of bradycardia: Continue diltiazem.  Report to the emergency room for any palpitations, chest pain, shortness of breath, dizziness or other concerns.  Continue to f/u with cardiology.     Obstructive sleep apnea: Continue APAP nightly as prescribed.     Hypothyroidism: Continue levothyroxine 125 mcg.  TSH today.     Hypertension: Continue losartan and hydrochlorothiazide.  Discussed blood pressure goal of 130-140/80-90.  Notify provider of sustained elevations above this target goal.  Verbalized understanding.    Kidney stones: Notify provider of any return of symptoms including dysuria or pain. Monitor.    Asthma: Continue albuterol as needed for wheezing or shortness of breath.      Allergic rhinitis: Continue Claritin.      GERD: Continue omeprazole over-the-counter.      Obesity: Reviewed  current BMI.  Monitor.    Anal fissure/Hemorrhoidal inflammation: Continue current regimen.    Depression/anxiety:  Continue Zoloft.  Increase dose to 100 mg daily.  Previously offered referral for psychiatric eval and counseling - wishes to defer.  Notify provider of worsening or failure to improve.  Report to ED for SI or HI.     Insomnia: Continue melatonin Gummies.  Discussed sleep hygiene measures.    Selena Lesser, FNP-BC

## 2021-06-06 NOTE — Nursing Note (Signed)
Patient here for follow up with medication refills.  Patient states that she thinks she has a sinus infection that has drained into her chest.  She states that it has been going on for about 2 weeks and is better except for the chest congestion.  Patient states that she is occassionally coughing up yellow sputum. Chip Boer, MA  06/06/2021, 13:26

## 2021-06-07 ENCOUNTER — Other Ambulatory Visit: Payer: 59 | Attending: Family | Admitting: Family

## 2021-06-07 LAB — COMPREHENSIVE METABOLIC PNL, FASTING
ALBUMIN/GLOBULIN RATIO: 1.4 (ref 0.8–1.4)
ALBUMIN: 4.1 g/dL (ref 3.5–5.7)
ALKALINE PHOSPHATASE: 87 U/L (ref 34–104)
ALT (SGPT): 19 U/L (ref 7–52)
ANION GAP: 5 mmol/L — ABNORMAL LOW (ref 10–20)
AST (SGOT): 19 U/L (ref 13–39)
BILIRUBIN TOTAL: 0.4 mg/dL (ref 0.3–1.2)
BUN/CREA RATIO: 21 (ref 6–22)
BUN: 17 mg/dL (ref 7–25)
CALCIUM, CORRECTED: 8.9 mg/dL (ref 8.9–10.8)
CALCIUM: 9 mg/dL (ref 8.6–10.3)
CHLORIDE: 104 mmol/L (ref 98–107)
CO2 TOTAL: 28 mmol/L (ref 21–31)
CREATININE: 0.82 mg/dL (ref 0.60–1.30)
ESTIMATED GFR: 90 mL/min/{1.73_m2} (ref >59)
GLOBULIN: 3 (ref 2.9–5.4)
GLUCOSE: 95 mg/dL (ref 74–109)
OSMOLALITY, CALCULATED: 275 mOsm/kg (ref 270–290)
POTASSIUM: 3.8 mmol/L (ref 3.5–5.1)
PROTEIN TOTAL: 7.1 g/dL (ref 6.4–8.9)
SODIUM: 137 mmol/L (ref 136–145)

## 2021-06-07 LAB — CBC
HCT: 38.6 % (ref 37.0–47.0)
HGB: 13.3 g/dL (ref 12.5–16.0)
MCH: 29.4 pg (ref 27.0–32.0)
MCHC: 34.4 g/dL (ref 32.0–36.0)
MCV: 85.6 fL (ref 78.0–99.0)
MPV: 8.8 fL (ref 7.4–10.4)
PLATELETS: 329 10*3/uL (ref 140–440)
RBC: 4.51 10*6/uL (ref 4.20–5.40)
RDW: 13.8 % (ref 11.6–14.8)
WBC: 11.4 10*3/uL — ABNORMAL HIGH (ref 4.0–10.5)
WBCS UNCORRECTED: 11.4 10*3/uL

## 2021-06-07 LAB — GOLD TOP TUBE

## 2021-06-07 LAB — LIPID PANEL
CHOL/HDL RATIO: 4.2
CHOLESTEROL: 164 mg/dL (ref 136–290)
HDL CHOL: 39 mg/dL (ref 23–92)
LDL CALC: 99 mg/dL (ref 0–100)
TRIGLYCERIDES: 129 mg/dL (ref <=150)
VLDL CALC: 26 mg/dL (ref 0–50)

## 2021-06-07 LAB — THYROID STIMULATING HORMONE (SENSITIVE TSH): TSH: 2.734 u[IU]/mL (ref 0.450–5.330)

## 2021-06-08 LAB — LDL CHOLESTEROL, DIRECT: LDL DIRECT: 108 mg/dL — ABNORMAL HIGH (ref <100)

## 2021-09-10 ENCOUNTER — Ambulatory Visit: Payer: Commercial Managed Care - PPO | Attending: Family | Admitting: Family

## 2021-09-10 ENCOUNTER — Other Ambulatory Visit: Payer: Self-pay

## 2021-09-10 ENCOUNTER — Ambulatory Visit (RURAL_HEALTH_CENTER): Payer: Commercial Managed Care - PPO | Attending: Family | Admitting: Family

## 2021-09-10 ENCOUNTER — Encounter (RURAL_HEALTH_CENTER): Payer: Self-pay | Admitting: Family

## 2021-09-10 DIAGNOSIS — E669 Obesity, unspecified: Secondary | ICD-10-CM | POA: Insufficient documentation

## 2021-09-10 DIAGNOSIS — F17210 Nicotine dependence, cigarettes, uncomplicated: Secondary | ICD-10-CM | POA: Insufficient documentation

## 2021-09-10 DIAGNOSIS — R001 Bradycardia, unspecified: Secondary | ICD-10-CM | POA: Insufficient documentation

## 2021-09-10 DIAGNOSIS — R000 Tachycardia, unspecified: Secondary | ICD-10-CM | POA: Insufficient documentation

## 2021-09-10 DIAGNOSIS — Z8659 Personal history of other mental and behavioral disorders: Secondary | ICD-10-CM | POA: Insufficient documentation

## 2021-09-10 DIAGNOSIS — G4733 Obstructive sleep apnea (adult) (pediatric): Secondary | ICD-10-CM | POA: Insufficient documentation

## 2021-09-10 DIAGNOSIS — J45909 Unspecified asthma, uncomplicated: Secondary | ICD-10-CM

## 2021-09-10 DIAGNOSIS — K602 Anal fissure, unspecified: Secondary | ICD-10-CM | POA: Insufficient documentation

## 2021-09-10 DIAGNOSIS — M545 Low back pain, unspecified: Secondary | ICD-10-CM

## 2021-09-10 DIAGNOSIS — Z6841 Body Mass Index (BMI) 40.0 and over, adult: Secondary | ICD-10-CM | POA: Insufficient documentation

## 2021-09-10 DIAGNOSIS — Z1331 Encounter for screening for depression: Secondary | ICD-10-CM | POA: Insufficient documentation

## 2021-09-10 DIAGNOSIS — F419 Anxiety disorder, unspecified: Secondary | ICD-10-CM | POA: Insufficient documentation

## 2021-09-10 DIAGNOSIS — E039 Hypothyroidism, unspecified: Secondary | ICD-10-CM | POA: Insufficient documentation

## 2021-09-10 DIAGNOSIS — F32A Depression, unspecified: Secondary | ICD-10-CM | POA: Insufficient documentation

## 2021-09-10 DIAGNOSIS — N132 Hydronephrosis with renal and ureteral calculous obstruction: Secondary | ICD-10-CM | POA: Insufficient documentation

## 2021-09-10 DIAGNOSIS — G4710 Hypersomnia, unspecified: Secondary | ICD-10-CM | POA: Insufficient documentation

## 2021-09-10 DIAGNOSIS — I10 Essential (primary) hypertension: Secondary | ICD-10-CM | POA: Insufficient documentation

## 2021-09-10 DIAGNOSIS — J4540 Moderate persistent asthma, uncomplicated: Secondary | ICD-10-CM | POA: Insufficient documentation

## 2021-09-10 DIAGNOSIS — M533 Sacrococcygeal disorders, not elsewhere classified: Secondary | ICD-10-CM | POA: Insufficient documentation

## 2021-09-10 DIAGNOSIS — M5441 Lumbago with sciatica, right side: Secondary | ICD-10-CM | POA: Insufficient documentation

## 2021-09-10 DIAGNOSIS — K649 Unspecified hemorrhoids: Secondary | ICD-10-CM | POA: Insufficient documentation

## 2021-09-10 DIAGNOSIS — G8929 Other chronic pain: Secondary | ICD-10-CM

## 2021-09-10 DIAGNOSIS — Z79890 Hormone replacement therapy (postmenopausal): Secondary | ICD-10-CM | POA: Insufficient documentation

## 2021-09-10 DIAGNOSIS — N200 Calculus of kidney: Secondary | ICD-10-CM

## 2021-09-10 DIAGNOSIS — R5383 Other fatigue: Secondary | ICD-10-CM

## 2021-09-10 DIAGNOSIS — B370 Candidal stomatitis: Secondary | ICD-10-CM | POA: Insufficient documentation

## 2021-09-10 DIAGNOSIS — M79645 Pain in left finger(s): Secondary | ICD-10-CM | POA: Insufficient documentation

## 2021-09-10 DIAGNOSIS — K219 Gastro-esophageal reflux disease without esophagitis: Secondary | ICD-10-CM | POA: Insufficient documentation

## 2021-09-10 LAB — CBC WITH DIFF
BASOPHIL #: 0.1 10*3/uL (ref 0.00–0.30)
BASOPHIL %: 1 % (ref 0–3)
EOSINOPHIL #: 0.3 10*3/uL (ref 0.00–0.80)
EOSINOPHIL %: 3 % (ref 0–7)
HCT: 41.9 % (ref 37.0–47.0)
HGB: 14.1 g/dL (ref 12.5–16.0)
LYMPHOCYTE #: 3.3 10*3/uL (ref 1.10–5.00)
LYMPHOCYTE %: 29 % (ref 25–45)
MCH: 28.4 pg (ref 27.0–32.0)
MCHC: 33.6 g/dL (ref 32.0–36.0)
MCV: 84.7 fL (ref 78.0–99.0)
MONOCYTE #: 0.6 10*3/uL (ref 0.00–1.30)
MONOCYTE %: 5 % (ref 0–12)
MPV: 8.3 fL (ref 7.4–10.4)
NEUTROPHIL #: 7.2 10*3/uL (ref 1.80–8.40)
NEUTROPHIL %: 63 % (ref 40–76)
PLATELETS: 369 10*3/uL (ref 140–440)
RBC: 4.95 10*6/uL (ref 4.20–5.40)
RDW: 13.7 % (ref 11.6–14.8)
WBC: 11.6 10*3/uL — ABNORMAL HIGH (ref 4.0–10.5)
WBCS UNCORRECTED: 11.6 10*3/uL

## 2021-09-10 LAB — COMPREHENSIVE METABOLIC PNL, FASTING
ALBUMIN/GLOBULIN RATIO: 1.4 (ref 0.8–1.4)
ALBUMIN: 4.2 g/dL (ref 3.5–5.7)
ALKALINE PHOSPHATASE: 84 U/L (ref 34–104)
ALT (SGPT): 26 U/L (ref 7–52)
ANION GAP: 5 mmol/L — ABNORMAL LOW (ref 10–20)
AST (SGOT): 22 U/L (ref 13–39)
BILIRUBIN TOTAL: 0.5 mg/dL (ref 0.3–1.2)
BUN/CREA RATIO: 17 (ref 6–22)
BUN: 16 mg/dL (ref 7–25)
CALCIUM, CORRECTED: 9.4 mg/dL (ref 8.9–10.8)
CALCIUM: 9.6 mg/dL (ref 8.6–10.3)
CHLORIDE: 102 mmol/L (ref 98–107)
CO2 TOTAL: 31 mmol/L (ref 21–31)
CREATININE: 0.94 mg/dL (ref 0.60–1.30)
ESTIMATED GFR: 76 mL/min/{1.73_m2} (ref >59)
GLOBULIN: 3 (ref 2.9–5.4)
GLUCOSE: 98 mg/dL (ref 74–109)
OSMOLALITY, CALCULATED: 277 mOsm/kg (ref 270–290)
POTASSIUM: 3.7 mmol/L (ref 3.5–5.1)
PROTEIN TOTAL: 7.2 g/dL (ref 6.4–8.9)
SODIUM: 138 mmol/L (ref 136–145)

## 2021-09-10 LAB — THYROID STIMULATING HORMONE (SENSITIVE TSH): TSH: 0.626 u[IU]/mL (ref 0.450–5.330)

## 2021-09-10 MED ORDER — HYDROCHLOROTHIAZIDE 25 MG TABLET
25.0000 mg | ORAL_TABLET | Freq: Every day | ORAL | 1 refills | Status: DC
Start: 2021-09-10 — End: 2022-05-28

## 2021-09-10 MED ORDER — CLOTRIMAZOLE 10 MG TROCHE
10.0000 mg | Freq: Every day | 0 refills | Status: DC
Start: 2021-09-10 — End: 2021-10-03

## 2021-09-10 MED ORDER — DILTIAZEM CD 120 MG CAPSULE,EXTENDED RELEASE 24 HR
120.0000 mg | ORAL_CAPSULE | Freq: Every day | ORAL | 1 refills | Status: DC
Start: 2021-09-10 — End: 2022-05-28

## 2021-09-10 MED ORDER — MONTELUKAST 10 MG TABLET
10.0000 mg | ORAL_TABLET | Freq: Every day | ORAL | 1 refills | Status: DC
Start: 2021-09-10 — End: 2022-05-28

## 2021-09-10 MED ORDER — ALBUTEROL SULFATE HFA 90 MCG/ACTUATION AEROSOL INHALER
1.0000 | INHALATION_SPRAY | Freq: Four times a day (QID) | RESPIRATORY_TRACT | 4 refills | Status: AC | PRN
Start: 2021-09-10 — End: 2021-12-09

## 2021-09-10 MED ORDER — LOSARTAN 50 MG TABLET
50.0000 mg | ORAL_TABLET | Freq: Every day | ORAL | 1 refills | Status: DC
Start: 2021-09-10 — End: 2022-05-28

## 2021-09-10 MED ORDER — LORATADINE 10 MG TABLET
10.0000 mg | ORAL_TABLET | Freq: Every day | ORAL | 1 refills | Status: DC
Start: 2021-09-10 — End: 2022-05-28

## 2021-09-10 MED ORDER — LEVOTHYROXINE 125 MCG TABLET
125.0000 ug | ORAL_TABLET | Freq: Every day | ORAL | 1 refills | Status: DC
Start: 2021-09-10 — End: 2022-05-28

## 2021-09-10 MED ORDER — OMEPRAZOLE 20 MG CAPSULE,DELAYED RELEASE
20.0000 mg | DELAYED_RELEASE_CAPSULE | Freq: Every day | ORAL | 1 refills | Status: DC
Start: 2021-09-10 — End: 2022-05-28

## 2021-09-10 NOTE — Progress Notes (Signed)
[facility]  [address]  [place], [place]  [address]  Phone: [phone] Fax: [phone]    Name: Rachel Mendoza                       Date of Birth: 03-20-1976   MRN:  [id number]                         Date of visit: 09/10/2021     Problem List Items Addressed This Visit        Cardiovascular System    Bradycardia    Tachycardia    Primary hypertension    Relevant Orders    CBC/DIFF (Completed)    COMPREHENSIVE METABOLIC PNL, FASTING (Completed)       Respiratory    Moderate persistent asthma without complication    Obstructive sleep apnea       Neurologic    Hypersomnia, unspecified       Nephrology    Kidney stones       Digestive    Gastroesophageal reflux disease    Oral candida       Endocrine    Hypothyroidism    Relevant Orders    THYROID STIMULATING HORMONE (SENSITIVE TSH) (Completed)       Musculoskeletal    Chronic lower back pain    Thumb pain, left    Relevant Orders    Referral to External Provider       Other    Fatigue    Obesity, unspecified    History of depression    Relevant Orders    Referral to [place]   Other Visit Diagnoses     Asthma, unspecified asthma severity, unspecified whether complicated, unspecified whether persistent        Relevant Medications    albuterol sulfate (PROVENTIL OR VENTOLIN OR PROAIR) 90 mcg/actuation Inhalation oral inhaler          Chief Complaint: Follow Up 3 Months (3 month follow up with medication refills. )    Past Medical History  Current Outpatient Medications   Medication Sig   . albuterol sulfate (PROVENTIL OR VENTOLIN OR PROAIR) 90 mcg/actuation Inhalation oral inhaler Take 1-2 Puffs by inhalation Every 6 hours as needed for up to 90 days   . clotrimazole (MYCELEX) 10 mg Mucous Membrane Troche Take 1 Troche (10 mg total) by mouth Five times a day Slowly dissolve in mouth; do not chew or swallow whole   . dilTIAZem (CARDIZEM CD) 120 mg Oral Capsule, Sust. Release 24 hr Take 1 Capsule (120 mg total) by mouth Once a day    . gabapentin (NEURONTIN) 300 mg Oral Capsule Take 1 Capsule (300 mg total) by mouth Three times a day   . hydroCHLOROthiazide (HYDRODIURIL) 25 mg Oral Tablet Take 1 Tablet (25 mg total) by mouth Once a day   . levothyroxine (SYNTHROID) 125 mcg Oral Tablet Take 1 Tablet (125 mcg total) by mouth Once a day   . loratadine (CLARITIN) 10 mg Oral Tablet Take 1 Tablet (10 mg total) by mouth Once a day   . losartan (COZAAR) 50 mg Oral Tablet Take 1 Tablet (50 mg total) by mouth Once a day   . montelukast (SINGULAIR) 10 mg Oral Tablet Take 1 Tablet (10 mg total) by mouth Once a day   . omeprazole (PRILOSEC) 20 mg Oral Capsule, Delayed Release(E.C.) Take 1 Capsule (20 mg total) by mouth Once a day  Allergies   Allergen Reactions   . Tamsulosin Nausea/ Vomiting     Past Medical History:   Diagnosis Date   . Allergic rhinitis    . Anal fissure    . Asthma    . Bradycardia    . Depression    . Esophageal reflux    . Hypersomnia    . Hypertension    . Hypothyroidism    . Insomnia, unspecified    . Kidney stones 06/06/2021   . Obesity, unspecified    . Sleep apnea    . Tachycardia, unspecified          Past Surgical History:   Procedure Laterality Date   . HX BACK SURGERY     . HX HYSTERECTOMY     . HX TONSILLECTOMY     . HX TUBAL LIGATION Bilateral          Family Medical History:     Problem Relation (Age of Onset)    Asthma Brother, Maternal Grandmother    Diabetes type II Father, Paternal Grandmother    Elevated Lipids Father    Heart Disease Father    Hypertension (High Blood Pressure) Mother, Father          Social History     Socioeconomic History   . Marital status: Married   Tobacco Use   . Smoking status: Every Day     Packs/day: 1.00     Types: Cigarettes     Start date: 04/08/1986     Last attempt to quit: 04/08/2018     Years since quitting: 3.4     Passive exposure: Never   . Smokeless tobacco: Never   Vaping Use   . Vaping Use: Never used   Substance and Sexual Activity   . Alcohol use: Yes     Comment: occasionally    . Drug use: Never          Patient Active Problem List    Diagnosis Date Noted   . Primary hypertension 09/10/2021   . Thumb pain, left 09/10/2021   . Oral candida 09/10/2021   . History of depression 09/10/2021   . Chronic lower back pain 06/06/2021   . Obstructive sleep apnea 06/06/2021   . Kidney stones 06/06/2021   . Hypersomnia, unspecified 05/01/2021   . Fatigue 05/01/2021   . Obesity, unspecified 05/01/2021   . Gastroesophageal reflux disease 05/01/2021   . Hypothyroidism 09/19/2020   . Bradycardia 09/19/2020   . Tachycardia 08/11/2020   . Moderate persistent asthma without complication 54/27/0623         Subjective:   Rachel Mendoza is a 46 y.o.  female that presents today for routine f/u and CDM.      ROS:  10 systems reviewed and were negative except as noted.     HPI:    Left thumb pain:  Reports development of pain in the left thumb since last visit.   [facility] and was [facility] and advised regarding a brace to apply.  She completed the steroids without resolution.  She has been using a brace to the left hand/wrist without resolution.    Pain has improved since initial injury but continues to have pain in the distal joint.   At times the distal joint will lock or become displaced and she has to manually pop back into place resulting in severe pain.    No range of motion in distal thumb joint.    Pain  right SI joint, lower back, and right sciatica: Patient reports longstanding history of chronic pain in the right SI joint.  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  Presented initially with increased pain over the last several months with numbness and burning sensation down the right leg.  Positive straight leg raise.  Positive for tenderness at L4-L5 and extreme tenderness in the right SI joint.  07/06/2019: X-ray right hip and pelvis: Increased sclerosis symphysis pubis otherwise unremarkable pelvis and right hip x-ray.  X-ray LS spine:6 non  rib-bearing lumbar vertebra the lower most is labeled L5 in this report. Mild retrolisthesis of L5 relative to L4 with narrowing in the L4-S1 discs and facet arthropathy. No definite acute bone abnormality.    Physical therapy initiated with some improvement.  Unable to walk or stand for sustained periods of time.  Amitriptyline initiated for nerve pain, depression, and insomnia.  Ultimately this was discontinued d/t increased agitation.    09/22/19: MRI LS spine: Minimal anteriolisthesis L4 and L5 vertebrae.  Negative for disc herniation at any level.  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  Multilevel neural foraminal stenosis.  Patient is now being followed by neurosurgery in [place].   Epidural steroid injections attempted without success.    Admission to [facility] from 08/09/2020 through 08/15/20.  08/09/2020: Right lumbar 4 transforaminal interbody fusion, pedicle screw instrumentation completed by Dr. Gomez Cleverly at [facility].   She did have episode of tachycardia during hospitalization with rate up to 160.  Started on Cardizem.    08/29/2020:  Presents today postoperatively.  Reports she has already had staples removed and incision line is healing well.   Currently wearing brace support.     01/09/2021:  Patient called in August requesting order for PT due to increased pain in lower back and pain in right leg.   Dr. Jacklynn Ganong has retired and recommended that all of his patients find providers in the [place] area but he did not give recommendations or referrals.  She reports she is supposed to f/u at 6 months, which will be in November for repeat imaging and neurosurgical f/u.  Needs referral.   Referral to [facility] submitted and completed.       03/07/2021:  Referral submitted to [facility].  They requested repeat MRI prior to referral which has been requested and remains pending.    03/12/2021:  [facility]   Interval postsurgical changes at L4-5 level with fixation device.  Normal alignment  No abnormal epidural collections, spinal stenosis or foraminal stenosis is visible.  Paravertebral soft tissues unremarkable.      06/06/2021:  Reports she is following at [facility].  No surgical concerns at this time.    Currently prescribed gabapentin.      Tachycardia/Bradycardia:   Patient previously reported continued concerns regarding bradycardia.  Reported that she was having episodes daily that would last from 30 seconds to 2 minutes.  Heart rate would drop to the 30s accompanied by chest pain, shortness of breath, and excessive yawning.  O2 saturations remained in the upper 90s during these episodes.  Spontaneous resolution.  She is unable to correlate these episodes with any other activities, medications, or activity.  EKGs in office have demonstrated normal sinus rhythm.  Attributed to alterations in TSH.  These bradycardic episodes tend to improve and then recur - no recent concerns.   More recently, she  has also reported episodes of tachycardia, palpitations.   48 hour holter monitor ordered but patient did not complete.  During recent hospitalization, heart rate was noted to go up to 160s to 170s range, documented as sinus tachycardia.   Diltiazem was initiated with ultimate improvement.  Dose was titrated and she is currently on diltiazem 120 daily.   Heart rate today in the 60-70 range.   Currently following with Dr. Marcille Blanco regularly.      Obstructive sleep apnea:   Chronic concerns regarding fatigue and insomnia.  Prior to back surgery, routinely slept on her abdomen and of course is unable to do so now.  While sleeping on her back, she is noted increased snoring, increased insomnia, and increased daytime sleepiness.  Stop bang screening questionnaire completed: Positive responses to snoring, tired, observation of apnea, and hypertension.  Also BMI greater than 35 and neck size of 17 inches in a  female.  Epworth screening also completed demonstrating moderate risk of excessive sleepiness.  10/31/2020: Home sleep study completed demonstrating severe OSA.  APAP prescribed.  She is currently using her APAP but has had some difficulty with adjustment and toleration.  Overall, she is tolerating her APAP well.      COVID-19: Reports recent illness with COVID-19 in January 2022.  States that she had difficulty for several weeks and [facility].  Ultimately, symptoms resolved.  No lingering concerns.  Back surgery had to be postponed due to COVID-19 diagnosis.    Kidney stones:  No current symptoms or concerns.  11/25/2019:  Patient presented to ED on 11/19/19 with left flank pain.  She has a history of kidney stones and states she has passed multiple stones in the past that were larger than the one she currently has identified by CT scan.  She is unable to take tamsulosin.  She has been trying to force fluids at home.  Reports pain has moved to the lower pelvic region and is no longer located in the flank area.  She has had intermittent periods of nausea since initiation of flank pain.  She has had lithotripsy in the past.  She is not currently followed by urology and does not have an appointment.  11/19/19:  CT abdomen/pelvis:  Left obstructive uropathy with left hydronephrosis secondary to a 5 mm urethral calculus at the L2-3 level.  KUB ordered but patient did not complete.    12/28/2019:   Patient presents today with complaint of urinary urgency, pelvic pain, frequency, and bladder spasms.  Reports that she finally was able to pass her kidney stone but over the last week or so has developed these symptoms.  Reports that she was taking frequent tub baths for relief of pain related to the kidney stone and believes that she has developed a urinary tract infection.  Denies fever, flank pain, CVA tenderness, body aches or chills.  She has been taking Azo over-the-counter.   She did not have KUB  completed after her last visit and reports that she feels the spasms are in the lower pelvis/bladder area and not related to stones.   Requests prescription for Diflucan if antibiotics are prescribed due to past history of yeast infections with antibiotic usage.  CLIA UA completed although patient has been taking AZO and results may not be accurate.  Urine was golden, trace protein, small amount of blood , positive nitrites.  We will submit for microscopic UA for confirmation.  Macrobid prescribed.   Microscopic UA: Negative.  Reflex culture not performed due to negative UA.    01/03/2020: Patient called with continued urinary symptoms. Antibiotic had been completed at that time. Advised to repeat UA and culture and have KUB - these recommendations were not completed.    01/27/2020:  Continues to have episodes of increased flank pain with dysuria and nausea.  The spasms spontaneously resolve and then recur.  She has not passed this stone.  KUB has not been completed.  Denies any dysuria, frequency or concerns today.   Appointment is scheduled with urology next week.     03/22/2020: Kidney stone passed the night before lithotripsy on 02/02/2020. Tolerated procedure well. No urinary symptoms at this time.    Hypothyroidism: Has a history of hypothyroidism since age 48.  Reports variability in TSH.  07/06/19:  TSH 16.03 Synthroid increased to 150 mcg daily from 125 mcg daily.  08/31/2019: TSH 1.05  09/29/2019: TSH 0.19, T4 5.6.  Dose decreased to 137 mcg daily.  11/25/2019:  TSH 1.63  01/27/2020:  TSH 0.26  Dose decreased to 125 mcg daily.   03/22/2020: TSH 0.73.  07/12/2020: Reports she accidentally was taking Synthroid 137 mcg daily.  Corrected this approximately 1 month ago and is back on 125 mcg daily.  07/12/2020: TSH 0.94  01/09/2021: TSH 1.13  06/06/2021: TSH 2.734    Hypertension: Well-controlled on current regimen.  Patient on losartan daily and hydrochlorothiazide.  Chlorthalidone previously initiated due to continued  hypertension.  Patient reported it made her drowsy and she was not taking regularly, this was changed to hydrochlorothiazide.     06/06/2021:  Cholesterol 164, triglycerides 129, direct LDL 108    Asthma: Longstanding history of asthma.  Has albuterol inhaler that she uses as needed as well as nebulizer.  Reports that she has had much less asthma flares since Covid pandemic due to environmental controls in addition to quitting smoking.   Currently resumed smoking and is smoking 1/2 ppd.   She had issues with increased utilization of albuterol after her lithotripsy in October.  She has incentive spirometry at home.  These concerns have resolved.   She intermittently uses Monteleukast and budesonide.    Today, reports she has not been taking.     Allergic rhinitis: Currently on Claritin over-the-counter and Flonase once daily.     GERD: Currently on omeprazole 20 over-the-counter.  Reports this controls symptoms but if she misses a dose that she has recurrent heartburn.  Denies any difficulty swallowing.    Obesity:  07/06/2019: Weight 251, BMI 41.8  12/28/2019: Weight 257, BMI 42.8  01/27/2020: Weight 253, BMI 42.1.  Congratulated on recent weight loss.  She is currently using itrack bites to track her oral intake.   03/22/2020:  Weight 252, BMI 42  07/12/2020: Weight 253, BMI 42  08/29/2020:  Weight 259, BMI 43.1  01/09/2021: Weight 256, BMI 42.5  03/07/2021: Weight 256, BMI 42.7  06/06/2021:  Weight 255.8, BMI 42.6  09/10/2021:  Weight 258, BMI 42.98    Anal fissure/hemorrhoids: No current concerns.   Recent development of anal fissure.  Surgical referral submitted and patient saw Dr. Eunice Blase in January.   Reports past medical history of hemorrhoids.   Currently using Colace 1-2 times daily and Dulcolax without stimulant as needed for constipation.  Tried fiber supplement which worsen constipation.  Trying to ensure adequate fluid intake.  Takes sitz bath's as needed for significant pain and irritation.  Lidocaine cream  prescribed but patient has not found much benefit with  this until recently as anal fissure has begun to heal.  States that anal fissure and hemorrhoidal inflammation continue to improve.    Depression and anxiety:  She has struggled with symptoms of depression and anxiety over the last couple of years with multiple health issues.  Reports strong family history of bipolar disorder which she has suspected that she has as well.  Reports previous manic episodes but these have resolved as she has gotten older.    Now suffers with depression symptoms.  Denies SI or HI.  Reports lack of enjoyment in daily life.  Reports preferring to stay in the bed instead of participating in activities with her family.  States she has been tried on different medications in the past and what has worked the best has been Engineer, civil (consulting).  Effexor made symptoms worse.  Reports using various other agents which also were unsuccessful but she is unable to remember the names.  Discussed counseling services but she wishes to defer.  01/09/2021: Zoloft initiated at 50 mg with increase to 75 mg.  03/07/2021:  Reports improvement but incomplete resolution.  She is interested in further increasing dosage. Zoloft increased to 100 mg.    06/06/2021:  Reported she weaned her Zoloft and she has been placed on gabapentin.  She reports this has helped with her depression as well as her pain.  Denies SI or HI.    09/10/2021:  Reports recurrence of symptoms.  Tearful at times during visit.  PHQ 9 score 24 without SI or HI.  She has tried and failed several first line treatments.  Discussed referral to a specialist for med management.  She is agreeable today.  Encouraged to follow through.      Oral candida:  She has used nystatin swish and swallow with improvement but incomplete resolution.      Insomnia:  Reports recurrent, daily issues with insomnia.  She uses melatonin Gummies with some improvement.  Not currently interested in any additional medication  management.    Influenza vaccination:  Reports she took the vaccine 4 years in a row previously and that each of the 4 years she became sick with flu-like symptoms following the vaccine and was very ill.  She no longer wishes to take this.       Physical Exam:    General: cooperative, healthy appearing and no acute distress  Orientation/Consciousness: patient oriented x3  HENMT  Ears: hearing grossly normal bilaterally  Mouth: oral mucosae normal  Eyes  General: appearance normal, both eyes and all related structures  Neck  Neck: normal visual inspection and no lymphadenopathy  Resp  Effort & Inspection: normal respiratory effort  Auscultation: clear to auscultation bilaterally, no crackles and no wheezes  Cardio  Jugular venous pressure: no JVD  Palpation: normal PMI  Rate: regular rate  Rhythm: regular rhythm  Heart Sounds: S1 normal, S2 normal, no click, no murmurs and no rubs  Pulses: normal peripheral pulses  GI  Inspection: Yes normal to inspection  Palpation: soft, no hepatosplenomegaly, no guarding, no masses and nontender  Auscultation: normal bowel sounds  Neuro  General: patient oriented x3  Psych  Mental Status: mental status grossly normal  Mood: congruent mood  Affect: normal affect  Insight: insight good  Judgment: judgment good      Assessment/Plan:    Left thumb pain: Continue brace to left thumb.  Referral to Ortho for additional evaluation and treatment.      Right sacroiliac pain/lower back pain and right sciatica: MRI  reviewed.   F/u with [facility] as scheduled.    Tachycardia/previous episodes of bradycardia: Continue diltiazem.  Report to the emergency room for any palpitations, chest pain, shortness of breath, dizziness or other concerns.  Continue to f/u with cardiology.     Obstructive sleep apnea: Continue APAP nightly as prescribed.     Hypothyroidism: Continue levothyroxine 125 mcg.  TSH today.     Hypertension: Continue losartan and hydrochlorothiazide.  Discussed blood  pressure goal of 130-140/80-90.  Notify provider of sustained elevations above this target goal.  Verbalized understanding.    Kidney stones: Notify provider of any return of symptoms including dysuria or pain. Monitor.    Asthma: Continue albuterol as needed for wheezing or shortness of breath.      Allergic rhinitis: Continue Claritin.      GERD: Continue omeprazole over-the-counter.      Obesity: Reviewed current BMI.  Monitor.    Anal fissure/Hemorrhoidal inflammation: Continue current regimen.    Depression/anxiety:  Report to ED for SI or HI.  Referral to be [facility] for additional evaluation and treatment.  She is agreeable.    Oral candida:  Clotrimazole trouche 5 times daily.  Notify worsening or failure to improve.    Insomnia: Continue melatonin Gummies.  Discussed sleep hygiene measures.    Selena Lesser, FNP-BC

## 2021-09-10 NOTE — Nursing Note (Signed)
Patient here for follow up with medication refills.  Patient said that she went to [place] for her left thumb and has a tendon problem and asking for referral to ortho. She said that she has a brace that she wears on it all the time.

## 2021-09-11 ENCOUNTER — Telehealth (RURAL_HEALTH_CENTER): Payer: Self-pay | Admitting: Family

## 2021-09-11 NOTE — Telephone Encounter (Signed)
Patient was notified.

## 2021-09-11 NOTE — Telephone Encounter (Signed)
-----   Message from Selena Lesser, FNP-BC sent at 09/10/2021 10:40 PM EDT -----  Let her know that her labs including her thyroid are all good

## 2021-09-24 ENCOUNTER — Other Ambulatory Visit: Payer: Self-pay

## 2021-09-24 ENCOUNTER — Ambulatory Visit: Payer: Commercial Managed Care - PPO | Attending: PHYSICIAN ASSISTANT | Admitting: PHYSICIAN ASSISTANT

## 2021-09-24 ENCOUNTER — Encounter (HOSPITAL_PSYCHIATRIC): Payer: Self-pay | Admitting: PHYSICIAN ASSISTANT

## 2021-09-24 DIAGNOSIS — Z8659 Personal history of other mental and behavioral disorders: Secondary | ICD-10-CM | POA: Insufficient documentation

## 2021-09-24 DIAGNOSIS — F17210 Nicotine dependence, cigarettes, uncomplicated: Secondary | ICD-10-CM

## 2021-09-24 DIAGNOSIS — F4312 Post-traumatic stress disorder, chronic: Secondary | ICD-10-CM

## 2021-09-24 DIAGNOSIS — F314 Bipolar disorder, current episode depressed, severe, without psychotic features: Secondary | ICD-10-CM

## 2021-09-24 MED ORDER — MIRTAZAPINE 7.5 MG TABLET
7.5000 mg | ORAL_TABLET | Freq: Every evening | ORAL | 0 refills | Status: DC | PRN
Start: 2021-09-24 — End: 2021-10-03

## 2021-09-24 MED ORDER — OXCARBAZEPINE 150 MG TABLET
150.0000 mg | ORAL_TABLET | Freq: Two times a day (BID) | ORAL | 0 refills | Status: DC
Start: 2021-09-24 — End: 2021-10-19

## 2021-09-24 MED ORDER — PRAZOSIN 1 MG CAPSULE
1.0000 mg | ORAL_CAPSULE | Freq: Every evening | ORAL | 0 refills | Status: DC | PRN
Start: 2021-09-24 — End: 2021-10-19
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
## 2021-09-24 NOTE — Patient Instructions (Signed)
Take medications as prescribed, avoid drugs and alcohol, call office if symptoms worsen or problems arise.  [phone].     Start Trileptal tonight. Tomorrow, you can add remeron. Wednesday night you can add Prazosin.

## 2021-10-01 ENCOUNTER — Telehealth (HOSPITAL_PSYCHIATRIC): Payer: Self-pay | Admitting: PHYSICIAN ASSISTANT

## 2021-10-02 ENCOUNTER — Telehealth (RURAL_HEALTH_CENTER): Payer: Self-pay | Admitting: Family

## 2021-10-02 NOTE — Telephone Encounter (Signed)
Rachel Mendoza, with [employer] said that Dr. Reuben Likes is needing a call back from you on patient's disability claim.  There were some papers that I gave you.  The number to call back is [phone].

## 2021-10-03 ENCOUNTER — Encounter (HOSPITAL_PSYCHIATRIC): Payer: Self-pay | Admitting: PHYSICIAN ASSISTANT

## 2021-10-03 ENCOUNTER — Other Ambulatory Visit: Payer: Self-pay

## 2021-10-03 ENCOUNTER — Ambulatory Visit: Payer: Commercial Managed Care - PPO | Attending: PHYSICIAN ASSISTANT | Admitting: PHYSICIAN ASSISTANT

## 2021-10-03 VITALS — BP 148/96 | HR 87 | Resp 18 | Ht 65.0 in | Wt 258.0 lb

## 2021-10-03 DIAGNOSIS — F314 Bipolar disorder, current episode depressed, severe, without psychotic features: Secondary | ICD-10-CM | POA: Insufficient documentation

## 2021-10-03 DIAGNOSIS — F4310 Post-traumatic stress disorder, unspecified: Secondary | ICD-10-CM | POA: Insufficient documentation

## 2021-10-05 NOTE — Telephone Encounter (Signed)
Called x 3.  Message left for return call.   She is being followed by [facility] neurosurgery and they are managing her chronic lower back issues.  The neurosurgeon will be the one to determine work restrictions.

## 2021-10-09 NOTE — Telephone Encounter (Signed)
Did not hear back from messages.  Faxed paperwork back to them with notation that she is being followed by neurosurgery.

## 2021-10-19 ENCOUNTER — Encounter (HOSPITAL_PSYCHIATRIC): Payer: Self-pay | Admitting: PHYSICIAN ASSISTANT

## 2021-10-19 ENCOUNTER — Other Ambulatory Visit: Payer: Self-pay

## 2021-10-19 ENCOUNTER — Ambulatory Visit: Payer: Commercial Managed Care - PPO | Attending: PHYSICIAN ASSISTANT | Admitting: PHYSICIAN ASSISTANT

## 2021-10-19 VITALS — BP 124/74 | HR 77 | Resp 16 | Ht 65.0 in | Wt 255.0 lb

## 2021-10-19 DIAGNOSIS — F319 Bipolar disorder, unspecified: Secondary | ICD-10-CM | POA: Insufficient documentation

## 2021-10-19 DIAGNOSIS — F4310 Post-traumatic stress disorder, unspecified: Secondary | ICD-10-CM | POA: Insufficient documentation

## 2021-10-19 MED ORDER — SERTRALINE 100 MG TABLET
50.0000 mg | ORAL_TABLET | Freq: Every day | ORAL | 2 refills | Status: DC
Start: 2021-10-19 — End: 2022-06-23

## 2021-10-19 MED ORDER — PRAZOSIN 1 MG CAPSULE
1.0000 mg | ORAL_CAPSULE | Freq: Every evening | ORAL | 2 refills | Status: DC | PRN
Start: 2021-10-19 — End: 2022-04-26

## 2021-10-19 NOTE — Patient Instructions (Signed)
Take medications as prescribed, avoid drugs and alcohol, call office if symptoms worsen or problems arise.  [phone].
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
## 2021-10-22 ENCOUNTER — Ambulatory Visit (HOSPITAL_PSYCHIATRIC): Payer: Self-pay | Admitting: PHYSICIAN ASSISTANT

## 2021-12-12 ENCOUNTER — Ambulatory Visit (RURAL_HEALTH_CENTER): Payer: Self-pay | Admitting: Family

## 2021-12-12 NOTE — Progress Notes (Deleted)
[name]  [address]  [place], [place]  [address]  Phone: [phone] Fax: [phone]    Name: Rachel Mendoza                       Date of Birth: May 20, 1975   MRN:  [id number]                         Date of visit: 12/12/2021     Problem List Items Addressed This Visit    None        Chief Complaint: No chief complaint on file.    Past Medical History  Current Outpatient Medications   Medication Sig    dilTIAZem (CARDIZEM CD) 120 mg Oral Capsule, Sust. Release 24 hr Take 1 Capsule (120 mg total) by mouth Once a day    gabapentin (NEURONTIN) 300 mg Oral Capsule Take 1 Capsule (300 mg total) by mouth Three times a day    hydroCHLOROthiazide (HYDRODIURIL) 25 mg Oral Tablet Take 1 Tablet (25 mg total) by mouth Once a day    levothyroxine (SYNTHROID) 125 mcg Oral Tablet Take 1 Tablet (125 mcg total) by mouth Once a day    loratadine (CLARITIN) 10 mg Oral Tablet Take 1 Tablet (10 mg total) by mouth Once a day    losartan (COZAAR) 50 mg Oral Tablet Take 1 Tablet (50 mg total) by mouth Once a day    melatonin 5 mg Oral Tablet, Chewable Chew 2 Tablets (10 mg total) Every night    montelukast (SINGULAIR) 10 mg Oral Tablet Take 1 Tablet (10 mg total) by mouth Once a day    omeprazole (PRILOSEC) 20 mg Oral Capsule, Delayed Release(E.C.) Take 1 Capsule (20 mg total) by mouth Once a day    prazosin (MINIPRESS) 1 mg Oral Capsule Take 1 Capsule (1 mg total) by mouth Every night as needed    sertraline (ZOLOFT) 100 mg Oral Tablet Take 0.5 Tablets (50 mg total) by mouth Once a day     Allergies   Allergen Reactions    Tamsulosin Nausea/ Vomiting     Past Medical History:   Diagnosis Date    Allergic rhinitis     Anal fissure     Asthma     Bradycardia     Depression     Esophageal reflux     Hypersomnia     Hypertension     Hypothyroidism     Insomnia, unspecified     Kidney stones 06/06/2021    Obesity, unspecified     Sleep apnea     Tachycardia, unspecified          Past Surgical History:   Procedure Laterality  Date    HX BACK SURGERY      HX HYSTERECTOMY      HX TONSILLECTOMY      HX TUBAL LIGATION Bilateral          Family Medical History:       Problem Relation (Age of Onset)    Asthma Brother, Maternal Grandmother    Diabetes type II Father, Paternal Grandmother    Elevated Lipids Father    Heart Disease Father    Hypertension (High Blood Pressure) Mother, Father            Social History     Socioeconomic History    Marital status: Married   Tobacco Use    Smoking status: Every Day  Packs/day: 1.00     Types: Cigarettes     Start date: 04/08/1986     Last attempt to quit: 04/08/2018     Years since quitting: 3.6     Passive exposure: Never    Smokeless tobacco: Never   Vaping Use    Vaping Use: Never used   Substance and Sexual Activity    Alcohol use: Yes     Comment: occasionally    Drug use: Yes     Comment: CBD Gummies          Patient Active Problem List    Diagnosis Date Noted    Primary hypertension 09/10/2021    Thumb pain, left 09/10/2021    Oral candida 09/10/2021    History of depression 09/10/2021    Chronic lower back pain 06/06/2021    Obstructive sleep apnea 06/06/2021    Kidney stones 06/06/2021    Hypersomnia, unspecified 05/01/2021    Fatigue 05/01/2021    Obesity, unspecified 05/01/2021    Gastroesophageal reflux disease 05/01/2021    Hypothyroidism 09/19/2020    Bradycardia 09/19/2020    Tachycardia 08/11/2020    Moderate persistent asthma without complication 10/93/2355         Subjective:   Rachel Mendoza is a 46 y.o.  female that presents today for routine f/u and CDM.      ROS:  10 systems reviewed and were negative except as noted.     HPI:    Left thumb pain:  Reports development of pain in the left thumb since last visit.   [facility] and was [facility] and advised regarding a brace to apply.  She completed the steroids without resolution.  She has been using a brace to the left hand/wrist without resolution.    Pain has improved since initial injury but continues to  have pain in the distal joint.   At times the distal joint will lock or become displaced and she has to manually pop back into place resulting in severe pain.    No range of motion in distal thumb joint.    Pain right SI joint, lower back, and right sciatica: Patient reports longstanding history of chronic pain in the right SI joint.  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  Presented initially with increased pain over the last several months with numbness and burning sensation down the right leg.  Positive straight leg raise.  Positive for tenderness at L4-L5 and extreme tenderness in the right SI joint.  07/06/2019: X-ray right hip and pelvis: Increased sclerosis symphysis pubis otherwise unremarkable pelvis and right hip x-ray.  X-ray LS spine:6 non rib-bearing lumbar vertebra the lower most is labeled L5 in this report. Mild retrolisthesis of L5 relative to L4 with narrowing in the L4-S1 discs and facet arthropathy. No definite acute bone abnormality.    Physical therapy initiated with some improvement.  Unable to walk or stand for sustained periods of time.  Amitriptyline initiated for nerve pain, depression, and insomnia.  Ultimately this was discontinued d/t increased agitation.    09/22/19: MRI LS spine: Minimal anteriolisthesis L4 and L5 vertebrae.  Negative for disc herniation at any level.  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  Multilevel neural foraminal stenosis.  Patient is now being followed by neurosurgery in [place].   Epidural steroid injections attempted without success.    Admission to [facility] from 08/09/2020  through 08/15/20.  08/09/2020: Right lumbar 4 transforaminal interbody fusion, pedicle screw instrumentation completed by Dr. Gomez Cleverly at [facility].   She did have episode of tachycardia during hospitalization with rate up to 160.  Started on Cardizem.    08/29/2020:  Presents today  postoperatively.  Reports she has already had staples removed and incision line is healing well.   Currently wearing brace support.     01/09/2021:  Patient called in August requesting order for PT due to increased pain in lower back and pain in right leg.   Dr. Jacklynn Ganong has retired and recommended that all of his patients find providers in the [place] area but he did not give recommendations or referrals.  She reports she is supposed to f/u at 6 months, which will be in November for repeat imaging and neurosurgical f/u.  Needs referral.   Referral to [facility] submitted and completed.       03/07/2021:  Referral submitted to [facility].  They requested repeat MRI prior to referral which has been requested and remains pending.    03/12/2021:  [facility]  Interval postsurgical changes at L4-5 level with fixation device.  Normal alignment  No abnormal epidural collections, spinal stenosis or foraminal stenosis is visible.  Paravertebral soft tissues unremarkable.      06/06/2021:  Reports she is following at [facility].  No surgical concerns at this time.    Currently prescribed gabapentin.      Tachycardia/Bradycardia:   Patient previously reported continued concerns regarding bradycardia.  Reported that she was having episodes daily that would last from 30 seconds to 2 minutes.  Heart rate would drop to the 30s accompanied by chest pain, shortness of breath, and excessive yawning.  O2 saturations remained in the upper 90s during these episodes.  Spontaneous resolution.  She is unable to correlate these episodes with any other activities, medications, or activity.  EKGs in office have demonstrated normal sinus rhythm.  Attributed to alterations in TSH.  These bradycardic episodes tend to improve and then recur - no recent concerns.   More recently, she has also reported episodes of tachycardia, palpitations.   48 hour holter monitor ordered but patient did not  complete.  During recent hospitalization, heart rate was noted to go up to 160s to 170s range, documented as sinus tachycardia.   Diltiazem was initiated with ultimate improvement.  Dose was titrated and she is currently on diltiazem 120 daily.   Heart rate today in the 60-70 range.   Currently following with Dr. Marcille Blanco regularly.      Obstructive sleep apnea:   Chronic concerns regarding fatigue and insomnia.  Prior to back surgery, routinely slept on her abdomen and of course is unable to do so now.  While sleeping on her back, she is noted increased snoring, increased insomnia, and increased daytime sleepiness.  Stop bang screening questionnaire completed: Positive responses to snoring, tired, observation of apnea, and hypertension.  Also BMI greater than 35 and neck size of 17 inches in a female.  Epworth screening also completed demonstrating moderate risk of excessive sleepiness.  10/31/2020: Home sleep study completed demonstrating severe OSA.    APAP prescribed and overall, she is tolerating her APAP well.      COVID-19: Reports recent illness with COVID-19 in January 2022.  States that she had difficulty for several weeks and [facility].  Ultimately, symptoms resolved.  No lingering concerns.  Back surgery had  to be postponed due to COVID-19 diagnosis.    Kidney stones:  No current symptoms or concerns.  11/25/2019:  Patient presented to ED on 11/19/19 with left flank pain.  She has a history of kidney stones and states she has passed multiple stones in the past that were larger than the one she currently has identified by CT scan.  She is unable to take tamsulosin.  She has been trying to force fluids at home.  Reports pain has moved to the lower pelvic region and is no longer located in the flank area.  She has had intermittent periods of nausea since initiation of flank pain.  She has had lithotripsy in the past.  She is not currently followed by urology and does not have an  appointment.  11/19/19:  CT abdomen/pelvis:  Left obstructive uropathy with left hydronephrosis secondary to a 5 mm urethral calculus at the L2-3 level.  KUB ordered but patient did not complete.    12/28/2019:   Patient presents today with complaint of urinary urgency, pelvic pain, frequency, and bladder spasms.  Reports that she finally was able to pass her kidney stone but over the last week or so has developed these symptoms.  Reports that she was taking frequent tub baths for relief of pain related to the kidney stone and believes that she has developed a urinary tract infection.  Denies fever, flank pain, CVA tenderness, body aches or chills.  She has been taking Azo over-the-counter.   She did not have KUB completed after her last visit and reports that she feels the spasms are in the lower pelvis/bladder area and not related to stones.   Requests prescription for Diflucan if antibiotics are prescribed due to past history of yeast infections with antibiotic usage.  CLIA UA completed although patient has been taking AZO and results may not be accurate.  Urine was golden, trace protein, small amount of blood , positive nitrites.  We will submit for microscopic UA for confirmation.  Macrobid prescribed.   Microscopic UA: Negative.  Reflex culture not performed due to negative UA.    01/03/2020: Patient called with continued urinary symptoms. Antibiotic had been completed at that time. Advised to repeat UA and culture and have KUB - these recommendations were not completed.    01/27/2020:  Continues to have episodes of increased flank pain with dysuria and nausea.  The spasms spontaneously resolve and then recur.  She has not passed this stone.  KUB has not been completed.  Denies any dysuria, frequency or concerns today.   Appointment is scheduled with urology next week.     03/22/2020: Kidney stone passed the night before lithotripsy on 02/02/2020. Tolerated procedure well. No urinary symptoms at this  time.    Hypothyroidism: Has a history of hypothyroidism since age 2.  Reports variability in TSH.  07/06/19:  TSH 16.03 Synthroid increased to 150 mcg daily from 125 mcg daily.  08/31/2019: TSH 1.05  09/29/2019: TSH 0.19, T4 5.6.  Dose decreased to 137 mcg daily.  11/25/2019:  TSH 1.63  01/27/2020:  TSH 0.26  Dose decreased to 125 mcg daily.   03/22/2020: TSH 0.73.  07/12/2020: Reports she accidentally was taking Synthroid 137 mcg daily.  Corrected this approximately 1 month ago and is back on 125 mcg daily.  07/12/2020: TSH 0.94  01/09/2021: TSH 1.13  06/06/2021: TSH 2.734    Hypertension: Well-controlled on current regimen.  Patient on losartan daily and hydrochlorothiazide.  Chlorthalidone previously initiated due to continued  hypertension.  Patient reported it made her drowsy and she was not taking regularly, this was changed to hydrochlorothiazide.     06/06/2021:  Cholesterol 164, triglycerides 129, direct LDL 108    Asthma: Longstanding history of asthma.  Has albuterol inhaler that she uses as needed as well as nebulizer.  Reports that she has had much less asthma flares since Covid pandemic due to environmental controls in addition to quitting smoking.   Currently resumed smoking and is smoking 1/2 ppd.   She had issues with increased utilization of albuterol after her lithotripsy in October.  She has incentive spirometry at home.  These concerns have resolved.   She intermittently uses Monteleukast and budesonide.    Today, reports she has not been taking.     Allergic rhinitis: Currently on Claritin over-the-counter and Flonase once daily.     GERD: Currently on omeprazole 20 over-the-counter.  Reports this controls symptoms but if she misses a dose that she has recurrent heartburn.  Denies any difficulty swallowing.    Obesity:  07/06/2019: Weight 251, BMI 41.8  12/28/2019: Weight 257, BMI 42.8  01/27/2020: Weight 253, BMI 42.1.  Congratulated on recent weight loss.  She is currently using itrack bites to track  her oral intake.   03/22/2020:  Weight 252, BMI 42  07/12/2020: Weight 253, BMI 42  08/29/2020:  Weight 259, BMI 43.1  01/09/2021: Weight 256, BMI 42.5  03/07/2021: Weight 256, BMI 42.7  06/06/2021:  Weight 255.8, BMI 42.6  09/10/2021:  Weight 258, BMI 42.98    Anal fissure/hemorrhoids: No current concerns.   Recent development of anal fissure.  Surgical referral submitted and patient saw Dr. Eunice Blase in January.   Reports past medical history of hemorrhoids.   Currently using Colace 1-2 times daily and Dulcolax without stimulant as needed for constipation.  Tried fiber supplement which worsen constipation.  Trying to ensure adequate fluid intake.  Takes sitz bath's as needed for significant pain and irritation.  Lidocaine cream prescribed but patient has not found much benefit with this until recently as anal fissure has begun to heal.  States that anal fissure and hemorrhoidal inflammation continue to improve.    Depression and anxiety:  She has struggled with symptoms of depression and anxiety over the last couple of years with multiple health issues.  Reports strong family history of bipolar disorder which she has suspected that she has as well.  Reports previous manic episodes but these have resolved as she has gotten older.    Now suffers with depression symptoms.  Denies SI or HI.  Reports lack of enjoyment in daily life.  Reports preferring to stay in the bed instead of participating in activities with her family.  States she has been tried on different medications in the past and what has worked the best has been Engineer, civil (consulting).  Effexor made symptoms worse.  Reports using various other agents which also were unsuccessful but she is unable to remember the names.  Discussed counseling services but she wishes to defer.  01/09/2021: Zoloft initiated at 50 mg with increase to 75 mg.  03/07/2021:  Reports improvement but incomplete resolution.  She is interested in further increasing dosage. Zoloft increased to 100 mg.     06/06/2021:  Reported she weaned her Zoloft and she has been placed on gabapentin.  She reports this has helped with her depression as well as her pain.  Denies SI or HI.    09/10/2021:  Reports recurrence of symptoms.  Tearful  at times during visit.  PHQ 9 score 24 without SI or HI.  She has tried and failed several first line treatments.  Discussed referral to a specialist for med management.  She is agreeable today.  Encouraged to follow through.      Oral candida:  She has used nystatin swish and swallow with improvement but incomplete resolution.      Insomnia:  Reports recurrent, daily issues with insomnia.  She uses melatonin Gummies with some improvement.  Not currently interested in any additional medication management.    Influenza vaccination:  Reports she took the vaccine 4 years in a row previously and that each of the 4 years she became sick with flu-like symptoms following the vaccine and was very ill.  She no longer wishes to take this.       Physical Exam:    General: cooperative, healthy appearing and no acute distress  Orientation/Consciousness: patient oriented x3  HENMT  Ears: hearing grossly normal bilaterally  Mouth: oral mucosae normal  Eyes  General: appearance normal, both eyes and all related structures  Neck  Neck: normal visual inspection and no lymphadenopathy  Resp  Effort & Inspection: normal respiratory effort  Auscultation: clear to auscultation bilaterally, no crackles and no wheezes  Cardio  Jugular venous pressure: no JVD  Palpation: normal PMI  Rate: regular rate  Rhythm: regular rhythm  Heart Sounds: S1 normal, S2 normal, no click, no murmurs and no rubs  Pulses: normal peripheral pulses  GI  Inspection: Yes normal to inspection  Palpation: soft, no hepatosplenomegaly, no guarding, no masses and nontender  Auscultation: normal bowel sounds  Neuro  General: patient oriented x3  Psych  Mental Status: mental status grossly normal  Mood: congruent mood  Affect: normal  affect  Insight: insight good  Judgment: judgment good      Assessment/Plan:    Left thumb pain: Continue brace to left thumb.  Referral to Ortho for additional evaluation and treatment.      Right sacroiliac pain/lower back pain and right sciatica: MRI reviewed.   F/u with [facility] as scheduled.    Tachycardia/previous episodes of bradycardia: Continue diltiazem.  Report to the emergency room for any palpitations, chest pain, shortness of breath, dizziness or other concerns.  Continue to f/u with cardiology.     Obstructive sleep apnea: Continue APAP nightly as prescribed.     Hypothyroidism: Continue levothyroxine 125 mcg.  TSH today.     Hypertension: Continue losartan and hydrochlorothiazide.  Discussed blood pressure goal of 130-140/80-90.  Notify provider of sustained elevations above this target goal.  Verbalized understanding.    Kidney stones: Notify provider of any return of symptoms including dysuria or pain. Monitor.    Asthma: Continue albuterol as needed for wheezing or shortness of breath.      Allergic rhinitis: Continue Claritin.      GERD: Continue omeprazole over-the-counter.      Obesity: Reviewed current BMI.  Monitor.    Anal fissure/Hemorrhoidal inflammation: Continue current regimen.    Depression/anxiety:  Report to ED for SI or HI.  Referral to be [facility] for additional evaluation and treatment.  She is agreeable.    Oral candida:  Clotrimazole trouche 5 times daily.  Notify worsening or failure to improve.    Insomnia: Continue melatonin Gummies.  Discussed sleep hygiene measures.    Selena Lesser, FNP-BC

## 2022-01-18 ENCOUNTER — Ambulatory Visit (HOSPITAL_PSYCHIATRIC): Payer: Commercial Managed Care - PPO | Admitting: PHYSICIAN ASSISTANT

## 2022-01-20 ENCOUNTER — Other Ambulatory Visit: Payer: Self-pay

## 2022-02-11 ENCOUNTER — Telehealth (RURAL_HEALTH_CENTER): Payer: Self-pay | Admitting: Family

## 2022-02-11 MED ORDER — BENZONATATE 100 MG CAPSULE
100.0000 mg | ORAL_CAPSULE | Freq: Three times a day (TID) | ORAL | 0 refills | Status: DC
Start: 2022-02-11 — End: 2022-03-07

## 2022-02-11 MED ORDER — ALBUTEROL SULFATE HFA 90 MCG/ACTUATION AEROSOL INHALER
1.0000 | INHALATION_SPRAY | Freq: Four times a day (QID) | RESPIRATORY_TRACT | 2 refills | Status: DC | PRN
Start: 2022-02-11 — End: 2022-05-28

## 2022-02-11 MED ORDER — DEXAMETHASONE 6 MG TABLET
6.0000 mg | ORAL_TABLET | Freq: Every day | ORAL | 0 refills | Status: DC
Start: 2022-02-11 — End: 2022-03-07

## 2022-02-11 NOTE — Telephone Encounter (Signed)
Patient wants to know if you can refill her nebulizer medication.  She said that she tested positive for COVID about 2 weeks ago.  She said that she is going on her 3rd week and believes she has pneumonia.  She asked for antibiotics and steroids as well and I told her she would have to come in and be seen just incase it was pneumonia.

## 2022-02-11 NOTE — Telephone Encounter (Signed)
Patient was notified.

## 2022-02-11 NOTE — Telephone Encounter (Signed)
If symptoms have persisted for 2-3 weeks, she needs to be seen.  I did go ahead and send in some Decadron and Tessalon Perles.    I sent an albuterol inhaler but nebulizers are not recommended in COVID patients because it aerosolized as the virus, increasing exposure of others around in the home.    If she is wheezing or short of breath she needs to go and be evaluated sooner rather than later.

## 2022-03-06 ENCOUNTER — Telehealth (RURAL_HEALTH_CENTER): Payer: Self-pay | Admitting: Family

## 2022-03-07 ENCOUNTER — Other Ambulatory Visit: Payer: Self-pay

## 2022-03-07 ENCOUNTER — Ambulatory Visit (RURAL_HEALTH_CENTER): Payer: Commercial Managed Care - PPO | Attending: Family | Admitting: Family

## 2022-03-07 ENCOUNTER — Ambulatory Visit: Payer: Commercial Managed Care - PPO | Attending: Family | Admitting: Family

## 2022-03-07 ENCOUNTER — Encounter (RURAL_HEALTH_CENTER): Payer: Self-pay | Admitting: Family

## 2022-03-07 DIAGNOSIS — E039 Hypothyroidism, unspecified: Secondary | ICD-10-CM | POA: Insufficient documentation

## 2022-03-07 DIAGNOSIS — I10 Essential (primary) hypertension: Secondary | ICD-10-CM | POA: Insufficient documentation

## 2022-03-07 DIAGNOSIS — F17210 Nicotine dependence, cigarettes, uncomplicated: Secondary | ICD-10-CM | POA: Insufficient documentation

## 2022-03-07 DIAGNOSIS — N63 Unspecified lump in unspecified breast: Secondary | ICD-10-CM | POA: Insufficient documentation

## 2022-03-07 NOTE — Progress Notes (Signed)
[facility]  [address]  [place], [place]  [address]  Phone: [phone] Fax: [phone]    Name: Rachel Mendoza                       Date of Birth: Aug 11, 1975   MRN:  [id number]                         Date of visit: 03/07/2022     Problem List Items Addressed This Visit          Cardiovascular System    Primary hypertension    Relevant Orders    CBC/DIFF    COMPREHENSIVE METABOLIC PNL, FASTING       Endocrine    Hypothyroidism    Relevant Orders    THYROID STIMULATING HORMONE (SENSITIVE TSH)       Obstetrics/Gynecology    Breast mass    Relevant Orders    MAMMO BILATERAL DIAGNOSTIC-ADDL VIEWS/BREAST US AS REQ BY RAD    Referral to [place], [place], [place]         Chief Complaint: Breast Lump (Right breast lump x 2 days)    Past Medical History  Current Outpatient Medications   Medication Sig    albuterol sulfate (PROVENTIL OR VENTOLIN OR PROAIR) 90 mcg/actuation Inhalation oral inhaler Take 1-2 Puffs by inhalation Every 6 hours as needed    dilTIAZem (CARDIZEM CD) 120 mg Oral Capsule, Sust. Release 24 hr Take 1 Capsule (120 mg total) by mouth Once a day    gabapentin (NEURONTIN) 300 mg Oral Capsule Take 1 Capsule (300 mg total) by mouth Three times a day    hydroCHLOROthiazide (HYDRODIURIL) 25 mg Oral Tablet Take 1 Tablet (25 mg total) by mouth Once a day    levothyroxine (SYNTHROID) 125 mcg Oral Tablet Take 1 Tablet (125 mcg total) by mouth Once a day    loratadine (CLARITIN) 10 mg Oral Tablet Take 1 Tablet (10 mg total) by mouth Once a day    losartan (COZAAR) 50 mg Oral Tablet Take 1 Tablet (50 mg total) by mouth Once a day    melatonin 5 mg Oral Tablet, Chewable Chew 2 Tablets (10 mg total) Every night    montelukast (SINGULAIR) 10 mg Oral Tablet Take 1 Tablet (10 mg total) by mouth Once a day    omeprazole (PRILOSEC) 20 mg Oral Capsule, Delayed Release(E.C.) Take 1 Capsule (20 mg total) by mouth Once a day    prazosin (MINIPRESS) 1 mg Oral Capsule Take 1  Capsule (1 mg total) by mouth Every night as needed    sertraline (ZOLOFT) 100 mg Oral Tablet Take 0.5 Tablets (50 mg total) by mouth Once a day     Allergies   Allergen Reactions    Dexamethasone (Pf) Swelling    Tamsulosin Nausea/ Vomiting     Past Medical History:   Diagnosis Date    Allergic rhinitis     Anal fissure     Asthma     Bradycardia     Depression     Esophageal reflux     Hypersomnia     Hypertension     Hypothyroidism     Insomnia, unspecified     Kidney stones 06/06/2021    Obesity, unspecified     Sleep apnea     Tachycardia, unspecified          Past Surgical History:   Procedure Laterality  Date    HX BACK SURGERY      HX HYSTERECTOMY      HX TONSILLECTOMY      HX TUBAL LIGATION Bilateral          Family Medical History:       Problem Relation (Age of Onset)    Asthma Brother, Maternal Grandmother    Diabetes type II Father, Paternal Grandmother    Elevated Lipids Father    Heart Disease Father    Hypertension (High Blood Pressure) Mother, Father            Social History     Socioeconomic History    Marital status: Married   Tobacco Use    Smoking status: Every Day     Packs/day: 1     Types: Cigarettes     Start date: 04/08/1986     Last attempt to quit: 04/08/2018     Years since quitting: 3.9     Passive exposure: Never    Smokeless tobacco: Never   Vaping Use    Vaping Use: Never used   Substance and Sexual Activity    Alcohol use: Yes     Comment: occasionally    Drug use: Yes     Comment: CBD Gummies          Patient Active Problem List    Diagnosis Date Noted    Breast mass 03/07/2022    Primary hypertension 09/10/2021    Thumb pain, left 09/10/2021    Oral candida 09/10/2021    History of depression 09/10/2021    Chronic lower back pain 06/06/2021    Obstructive sleep apnea 06/06/2021    Kidney stones 06/06/2021    Hypersomnia, unspecified 05/01/2021    Fatigue 05/01/2021    Obesity, unspecified 05/01/2021    Gastroesophageal reflux disease 05/01/2021    Hypothyroidism 09/19/2020     Bradycardia 09/19/2020    Tachycardia 08/11/2020    Moderate persistent asthma without complication 93/23/5573         Subjective:   Rachel Mendoza is a 46 y.o.  female that presents today for acute care visit.  Chronic conditions not reviewed today - labs obtained and advised to return for CDM.    Last visit to primary care 09/10/2021.    ROS:  10 systems reviewed and were negative except as noted.     HPI:    Right breast mass:   Reports that she recently noted a mass in the right breast.    She has a round, firm nodule approximately the diameter of a quarter at approximately 9:00 p.m. position of the right breast.  Denies tenderness or pain initially but reports that since she felt it she has been manipulating it and it is more tender now.  No redness or warmth noted.    Reports previous mammogram although it has been several years.  She is uncertain where she had this done.  Will attempt to obtain report.  No record of mammogram in [facility] or [facility].  Prior EMR, [facility] reviewed with notation that she had mammogram in 2016 but report is not in there.  Will check at [facility] tomorrow.    Mammogram ordered and scheduled in March of this year but she declined at that time.    Discussed options currently-referral discussed to general surgery for additional evaluation and treatment and she is agreeable to this approach.  Diagnostic mammogram order also submitted.      Physical Exam:    General:  cooperative, healthy appearing and no acute distress  Orientation/Consciousness: patient oriented x3  HENMT  Ears: hearing grossly normal bilaterally  Mouth: oral mucosae normal  Eyes  General: appearance normal, both eyes and all related structures  Neck  Neck: normal visual inspection and no lymphadenopathy  Resp  Effort & Inspection: normal respiratory effort  Auscultation: clear to auscultation bilaterally, no crackles and no wheezes  Cardio  Jugular venous pressure: no JVD  Palpation: normal PMI  Rate: regular  rate  Rhythm: regular rhythm  Heart Sounds: S1 normal, S2 normal, no click, no murmurs and no rubs  Pulses: normal peripheral pulses  GI  Inspection: Yes normal to inspection  Palpation: soft, no hepatosplenomegaly, no guarding, no masses and nontender  Auscultation: normal bowel sounds  Neuro  General: patient oriented x3  Psych  Mental Status: mental status grossly normal  Mood: congruent mood  Affect: normal affect  Insight: insight good  Judgment: judgment good      Assessment/Plan:    Right breast mass:  Referral submitted to Dr. Venia Minks for additional evaluation and treatment.  Diagnostic mammogram order submitted.  Will contact [facility] to see if she has prior mammogram recorded there.      Lab order submitted; further treatment pending results.    Advised to schedule f/u visit for CDM.      Selena Lesser, FNP-BC

## 2022-03-07 NOTE — Nursing Note (Signed)
Patient here for right breast lump x 2 days.  Patient states that the lump is sore.

## 2022-03-08 LAB — CBC WITH DIFF
BASOPHIL #: 0.1 10*3/uL (ref 0.00–0.10)
BASOPHIL %: 1 % (ref 0–1)
EOSINOPHIL #: 0.3 10*3/uL (ref 0.00–0.50)
EOSINOPHIL %: 3 %
HCT: 39.9 % (ref 31.2–41.9)
HGB: 13.4 g/dL (ref 10.9–14.3)
LYMPHOCYTE #: 2.9 10*3/uL (ref 1.00–3.00)
LYMPHOCYTE %: 27 % (ref 16–44)
MCH: 28.9 pg (ref 24.7–32.8)
MCHC: 33.5 g/dL (ref 32.3–35.6)
MCV: 86.2 fL (ref 75.5–95.3)
MONOCYTE #: 0.4 10*3/uL (ref 0.30–1.00)
MONOCYTE %: 4 % — ABNORMAL LOW (ref 5–13)
MPV: 8.5 fL (ref 7.9–10.8)
NEUTROPHIL #: 7 10*3/uL (ref 1.85–7.80)
NEUTROPHIL %: 65 % (ref 43–77)
PLATELETS: 378 10*3/uL (ref 140–440)
RBC: 4.62 10*6/uL (ref 3.63–4.92)
RDW: 14.1 % (ref 12.3–17.7)
WBC: 10.7 10*3/uL (ref 3.8–11.8)

## 2022-03-08 LAB — COMPREHENSIVE METABOLIC PNL, FASTING
ALBUMIN/GLOBULIN RATIO: 1.3 (ref 0.8–1.4)
ALBUMIN: 4.1 g/dL (ref 3.5–5.7)
ALKALINE PHOSPHATASE: 88 U/L (ref 34–104)
ALT (SGPT): 28 U/L (ref 7–52)
ANION GAP: 5 mmol/L (ref 4–13)
AST (SGOT): 22 U/L (ref 13–39)
BILIRUBIN TOTAL: 0.5 mg/dL (ref 0.3–1.2)
BUN/CREA RATIO: 19 (ref 6–22)
BUN: 16 mg/dL (ref 7–25)
CALCIUM, CORRECTED: 9.2 mg/dL (ref 8.9–10.8)
CALCIUM: 9.3 mg/dL (ref 8.6–10.3)
CHLORIDE: 103 mmol/L (ref 98–107)
CO2 TOTAL: 29 mmol/L (ref 21–31)
CREATININE: 0.86 mg/dL (ref 0.60–1.30)
ESTIMATED GFR: 84 mL/min/{1.73_m2} (ref >59)
GLOBULIN: 3.1 (ref 2.9–5.4)
GLUCOSE: 91 mg/dL (ref 74–109)
OSMOLALITY, CALCULATED: 275 mOsm/kg (ref 270–290)
POTASSIUM: 3.4 mmol/L — ABNORMAL LOW (ref 3.5–5.1)
PROTEIN TOTAL: 7.2 g/dL (ref 6.4–8.9)
SODIUM: 137 mmol/L (ref 136–145)

## 2022-03-08 LAB — THYROID STIMULATING HORMONE (SENSITIVE TSH): TSH: 0.965 u[IU]/mL (ref 0.450–5.330)

## 2022-03-11 ENCOUNTER — Telehealth (RURAL_HEALTH_CENTER): Payer: Self-pay | Admitting: Family

## 2022-03-11 NOTE — Telephone Encounter (Signed)
Left message for patient to return call to the office.

## 2022-03-11 NOTE — Telephone Encounter (Signed)
-----   Message from Selena Lesser, FNP-BC sent at 03/08/2022  9:51 PM EST -----  Let her know that her potassium level is slightly decreased. We will monitor for now.  Other labs including TSH are normal.

## 2022-03-12 NOTE — Telephone Encounter (Signed)
Patient was notified.

## 2022-03-13 ENCOUNTER — Encounter (HOSPITAL_COMMUNITY): Payer: Self-pay

## 2022-03-13 ENCOUNTER — Ambulatory Visit
Admission: RE | Admit: 2022-03-13 | Discharge: 2022-03-13 | Disposition: A | Discharge status: Home / Self Care | Payer: Commercial Managed Care - PPO | Source: Ambulatory Visit | Attending: Family | Admitting: Family

## 2022-03-13 ENCOUNTER — Other Ambulatory Visit: Payer: Self-pay

## 2022-03-13 ENCOUNTER — Telehealth (RURAL_HEALTH_CENTER): Payer: Self-pay | Admitting: Family

## 2022-03-13 DIAGNOSIS — N6315 Unspecified lump in the right breast, overlapping quadrants: Secondary | ICD-10-CM

## 2022-03-13 DIAGNOSIS — N63 Unspecified lump in unspecified breast: Secondary | ICD-10-CM | POA: Insufficient documentation

## 2022-03-13 NOTE — Telephone Encounter (Signed)
Patient was notified.

## 2022-03-13 NOTE — Telephone Encounter (Signed)
-----   Message from Selena Lesser, FNP-BC sent at 03/13/2022 12:42 PM EST -----  Let her know that her mammogram demonstrates the mass that we are able to feel in her breast but it is inconclusive and the radiologist recommends an ultrasound for additional eval.  This will most likely be completed during her visit with General surgery on 12/13.  Tell her to follow-up with [place] after that appointment and we will proceed from there.

## 2022-03-14 ENCOUNTER — Other Ambulatory Visit (RURAL_HEALTH_CENTER): Payer: Self-pay | Admitting: Family

## 2022-03-14 DIAGNOSIS — R928 Other abnormal and inconclusive findings on diagnostic imaging of breast: Secondary | ICD-10-CM

## 2022-03-15 ENCOUNTER — Other Ambulatory Visit (HOSPITAL_COMMUNITY): Payer: Self-pay

## 2022-03-20 ENCOUNTER — Other Ambulatory Visit (INDEPENDENT_AMBULATORY_CARE_PROVIDER_SITE_OTHER): Payer: Self-pay | Admitting: Surgery

## 2022-03-20 ENCOUNTER — Inpatient Hospital Stay (INDEPENDENT_AMBULATORY_CARE_PROVIDER_SITE_OTHER)
Admission: RE | Admit: 2022-03-20 | Discharge: 2022-03-20 | Disposition: A | Discharge status: Home / Self Care | Payer: Commercial Managed Care - PPO | Source: Ambulatory Visit

## 2022-03-20 ENCOUNTER — Encounter (INDEPENDENT_AMBULATORY_CARE_PROVIDER_SITE_OTHER): Payer: Self-pay | Admitting: Surgery

## 2022-03-20 ENCOUNTER — Other Ambulatory Visit: Payer: Commercial Managed Care - PPO | Attending: Surgery | Admitting: Surgery

## 2022-03-20 ENCOUNTER — Ambulatory Visit (INDEPENDENT_AMBULATORY_CARE_PROVIDER_SITE_OTHER): Payer: Commercial Managed Care - PPO | Admitting: Surgery

## 2022-03-20 ENCOUNTER — Other Ambulatory Visit: Payer: Self-pay

## 2022-03-20 DIAGNOSIS — N63 Unspecified lump in unspecified breast: Secondary | ICD-10-CM

## 2022-03-20 DIAGNOSIS — C50911 Malignant neoplasm of unspecified site of right female breast: Secondary | ICD-10-CM | POA: Insufficient documentation

## 2022-03-20 NOTE — H&P (Signed)
GENERAL SURGERY, [facility]  [address]  [place] [address]    History and Physical     Name: Rachel Mendoza MRN:  [id number]   Date: 03/20/2022 Age: 46 y.o.                Reason for Visit: Breast Mass (Right )    History of Present Illness  Rachel Mendoza presents today with a self discovered right breast mass.  Discovered a proximally 1 month ago.  Mild tenderness but otherwise has been unchanged in size since it was initially discovered.  No nipple discharge.  No skin changes.      No previous breast biopsy.  No family history of breast cancer    Underwent a mammogram and an ultrasound with the final determination of a BI-RADS category 4 with a suspicious appearing right breast mass corresponding to the palpable lump.        MEDICAL DECISION:  Review of the result(s) of each unique test:  Patient underwent diagnostic testing ( breast imaging studies) prior to this dates visit.  I have personally reviewed the results and that serves as a component of the medical decision making for this encounter       Review of prior external note(s) from each unique source:  Patients referral to this office including a recent assessment by the referring provider.  This was reviewed by me for this unique office visit for the indication and intent of the referral as well as any pertinent medical or surgical history relevant to the patients independent evaluation by me today.        Patient Data  Patient History  Past Medical History:   Diagnosis Date    Allergic rhinitis     Anal fissure     Asthma     Bradycardia     Depression     Esophageal reflux     Hypersomnia     Hypertension     Hypothyroidism     Insomnia, unspecified     Kidney stones 06/06/2021    Obesity, unspecified     Sleep apnea     Tachycardia, unspecified          Past Surgical History:   Procedure Laterality Date    HX BACK SURGERY      HX HYSTERECTOMY      HX LITHOTRIPSY      HX TONSILLECTOMY      HX TUBAL LIGATION  Bilateral          Current Outpatient Medications   Medication Sig    albuterol sulfate (PROVENTIL OR VENTOLIN OR PROAIR) 90 mcg/actuation Inhalation oral inhaler Take 1-2 Puffs by inhalation Every 6 hours as needed    dilTIAZem (CARDIZEM CD) 120 mg Oral Capsule, Sust. Release 24 hr Take 1 Capsule (120 mg total) by mouth Once a day    gabapentin (NEURONTIN) 300 mg Oral Capsule Take 1 Capsule (300 mg total) by mouth Three times a day    hydroCHLOROthiazide (HYDRODIURIL) 25 mg Oral Tablet Take 1 Tablet (25 mg total) by mouth Once a day    levothyroxine (SYNTHROID) 125 mcg Oral Tablet Take 1 Tablet (125 mcg total) by mouth Once a day    loratadine (CLARITIN) 10 mg Oral Tablet Take 1 Tablet (10 mg total) by mouth Once a day    losartan (COZAAR) 50 mg Oral Tablet Take 1 Tablet (50 mg total) by mouth Once a day    melatonin 5 mg  Oral Tablet, Chewable Chew 2 Tablets (10 mg total) Every night    montelukast (SINGULAIR) 10 mg Oral Tablet Take 1 Tablet (10 mg total) by mouth Once a day    omeprazole (PRILOSEC) 20 mg Oral Capsule, Delayed Release(E.C.) Take 1 Capsule (20 mg total) by mouth Once a day    prazosin (MINIPRESS) 1 mg Oral Capsule Take 1 Capsule (1 mg total) by mouth Every night as needed    sertraline (ZOLOFT) 100 mg Oral Tablet Take 0.5 Tablets (50 mg total) by mouth Once a day     Allergies   Allergen Reactions    Dexamethasone (Pf) Swelling    Tamsulosin Nausea/ Vomiting     Family Medical History:       Problem Relation (Age of Onset)    Asthma Brother, Maternal Grandmother    Diabetes type II Father, Paternal Grandmother    Elevated Lipids Father    Heart Disease Father    Hypertension (High Blood Pressure) Mother, Father    No Known Problems Sister, Maternal Grandfather, Paternal Grandfather, Daughter, Son, Maternal Aunt, Maternal Uncle, Paternal 33, Paternal Uncle, Other            Social History     Tobacco Use    Smoking status: Every Day     Packs/day: 1     Types: Cigarettes     Start date: 04/08/1986      Last attempt to quit: 04/08/2018     Years since quitting: 3.9     Passive exposure: Never    Smokeless tobacco: Never   Vaping Use    Vaping Use: Never used   Substance Use Topics    Alcohol use: Yes     Comment: occasionally    Drug use: Yes     Comment: CBD Gummies            Physical Examination:  Vitals:    03/20/22 1346   BP: (!) 125/91   Pulse: 95   Temp: 37 C (98.6 F)   SpO2: 95%   Weight: 119 kg (263 lb 6.4 oz)   Height: 1.651 m ('5\' 5"'$ )   BMI: 43.92      General: appropriate for age. in no acute distress.    Vital signs are present above and have been reviewed by me     HEENT: Atraumatic, Normocephalic.    Lungs: Nonlabored breathing with symmetric expansion    Breast:  Palpable mass right upper outer quadrant a proximally 1.5 cm in overall size.  Freely movable.  No palpable axillary adenopathy.    Heart:Regular wth respect to rate and rythmn.    Abdomen:Soft. Nontender. Nondistended     Psychiatric: Alert and oriented to person, place, and time. affect appropriate      Assessment and Plan    ICD-10-CM    1. Breast mass  N63.0 SURGICAL PATHOLOGY SPECIMEN            Tolerated ultrasound-guided biopsy with no difficulty.  Follow-up depends on the results of the biopsy.          I appreciate the opportunity to be involved in the care of your patients.  If you have any questions or concerns regarding this encounter, please do not hesitate to contact me at your convenience.      Bridgett Larsson MD MBA CPE FACS     This note may have been partially generated using MModal Fluency Direct system, and there may be some incorrect words, spellings, and punctuation that  were not noted in checking the note before saving, though effort was made to avoid such errors.

## 2022-03-20 NOTE — Nursing Note (Signed)
Informed consent obtained. Area prepped with betadine. Local anesthesia was used to make a small skin wheel to anesthetize the area. A small skin incision was made.    The Celero probe was advanced under direct vision within the lesion with ultrasound guidance.  Multiple specimen were taken using the Celero probe.  Clip placed at site of mass    Patient tolerated the procedure without difficulty.  Minimal bleeding noted. Steri strip and compression dressing over incision site.  Domingo Mend, LPN  81/59/4707 61:51

## 2022-03-22 DIAGNOSIS — C50911 Malignant neoplasm of unspecified site of right female breast: Secondary | ICD-10-CM

## 2022-03-23 ENCOUNTER — Encounter (INDEPENDENT_AMBULATORY_CARE_PROVIDER_SITE_OTHER): Payer: Self-pay | Admitting: Surgery

## 2022-03-23 NOTE — Procedures (Signed)
GENERAL SURGERY, [address]  [place] [address]    Procedure Note    Name: Rachel Mendoza MRN:  [id number]   Date: 03/20/2022 Age: 46 y.o.  DOB:   Jun 17, 1975       BX BREAST W PLACE OF BREAST LOCALIZATION DEVICES W IMAGING, PERCUTANEOUS; UP TO 10 LESIONS (AMB ONLY)    Performed by: Shirlee More, MD  Authorized by: Shirlee More, MD    Time Out:      Immediately before the procedure, a time out was called:  Yes      Patient verified:  Yes      Procedure verified:  Yes      Site verified:  Yes  Procedure Details:      Number of lesions:  1       Documentation:  The lesion was identified in the right  breast using ultrasound transducer.  Local anesthesia was used to make a small skin wheel to anesthetize the area.  Subsequently the local anesthesia was infiltrated beneath the lesion under ultrasound guidance.  A small skin incision was made using an #11 blade.     The Celero probe was advanced under direct vision within the lesion.  Multiple core sections were taken using the Celero probe.  Clip was placed at the site of the biopsy.     She tolerated the procedure without difficulty.  A steri-strip was placed over the incision site.       Bridgett Larsson MD MBA CPE FACS

## 2022-03-24 DIAGNOSIS — C50411 Malignant neoplasm of upper-outer quadrant of right female breast: Secondary | ICD-10-CM | POA: Insufficient documentation

## 2022-03-24 NOTE — H&P (Unsigned)
GENERAL SURGERY, [facility]  [address]  [place] [address]    History and Physical     Name: Rachel Mendoza MRN:  [id number]   Date: 03/25/2022 Age: 45 y.o.                Reason for Visit: No chief complaint on file.    History of Present Illness  Rachel Mendoza presents today following ultrasound-guided biopsy performed by me last week.  This showed infiltrating ductal carcinoma grade 3.  Hormonal receptors still pending.  Tolerated procedure without difficulty            Patient Data  Patient History  Past Medical History:   Diagnosis Date    Allergic rhinitis     Anal fissure     Asthma     Bradycardia     Depression     Esophageal reflux     Hypersomnia     Hypertension     Hypothyroidism     Insomnia, unspecified     Kidney stones 06/06/2021    Obesity, unspecified     Sleep apnea     Tachycardia, unspecified          Past Surgical History:   Procedure Laterality Date    HX BACK SURGERY      HX HYSTERECTOMY      HX LITHOTRIPSY      HX TONSILLECTOMY      HX TUBAL LIGATION Bilateral          Current Outpatient Medications   Medication Sig    albuterol sulfate (PROVENTIL OR VENTOLIN OR PROAIR) 90 mcg/actuation Inhalation oral inhaler Take 1-2 Puffs by inhalation Every 6 hours as needed    dilTIAZem (CARDIZEM CD) 120 mg Oral Capsule, Sust. Release 24 hr Take 1 Capsule (120 mg total) by mouth Once a day    gabapentin (NEURONTIN) 300 mg Oral Capsule Take 1 Capsule (300 mg total) by mouth Three times a day    hydroCHLOROthiazide (HYDRODIURIL) 25 mg Oral Tablet Take 1 Tablet (25 mg total) by mouth Once a day    levothyroxine (SYNTHROID) 125 mcg Oral Tablet Take 1 Tablet (125 mcg total) by mouth Once a day    loratadine (CLARITIN) 10 mg Oral Tablet Take 1 Tablet (10 mg total) by mouth Once a day    losartan (COZAAR) 50 mg Oral Tablet Take 1 Tablet (50 mg total) by mouth Once a day    melatonin 5 mg Oral Tablet, Chewable Chew 2 Tablets (10 mg total) Every night    montelukast  (SINGULAIR) 10 mg Oral Tablet Take 1 Tablet (10 mg total) by mouth Once a day    omeprazole (PRILOSEC) 20 mg Oral Capsule, Delayed Release(E.C.) Take 1 Capsule (20 mg total) by mouth Once a day    prazosin (MINIPRESS) 1 mg Oral Capsule Take 1 Capsule (1 mg total) by mouth Every night as needed    sertraline (ZOLOFT) 100 mg Oral Tablet Take 0.5 Tablets (50 mg total) by mouth Once a day     Allergies   Allergen Reactions    Dexamethasone (Pf) Swelling    Tamsulosin Nausea/ Vomiting     Family Medical History:       Problem Relation (Age of Onset)    Asthma Brother, Maternal Grandmother    Diabetes type II Father, Paternal Grandmother    Elevated Lipids Father    Heart Disease Father    Hypertension (High Blood Pressure) Mother, Father  No Known Problems Sister, Maternal Grandfather, Paternal Grandfather, Daughter, Son, Maternal Aunt, Maternal Uncle, Paternal Aunt, Paternal Uncle, Other            Social History     Tobacco Use    Smoking status: Every Day     Packs/day: 1     Types: Cigarettes     Start date: 04/08/1986     Last attempt to quit: 04/08/2018     Years since quitting: 3.9     Passive exposure: Never    Smokeless tobacco: Never   Vaping Use    Vaping Use: Never used   Substance Use Topics    Alcohol use: Yes     Comment: occasionally    Drug use: Yes     Comment: CBD Gummies            Physical Examination:  There were no vitals filed for this visit.   General: appropriate for age. in no acute distress.    Vital signs are present above and have been reviewed by me     HEENT: Atraumatic, Normocephalic.    Lungs: Nonlabored breathing with symmetric expansion    Heart:Regular wth respect to rate and rythmn.    Abdomen:Soft. Nontender. Nondistended     Psychiatric: Alert and oriented to person, place, and time. affect appropriate      Assessment and Plan    ICD-10-CM    1. Malignant neoplasm of upper-outer quadrant of right female breast (CMS [place])  C50.411             ***          I appreciate the opportunity  to be involved in the care of your patients.  If you have any questions or concerns regarding this encounter, please do not hesitate to contact me at your convenience.      Bridgett Larsson MD MBA CPE FACS     This note may have been partially generated using MModal Fluency Direct system, and there may be some incorrect words, spellings, and punctuation that were not noted in checking the note before saving, though effort was made to avoid such errors.

## 2022-03-25 ENCOUNTER — Ambulatory Visit (INDEPENDENT_AMBULATORY_CARE_PROVIDER_SITE_OTHER): Payer: Commercial Managed Care - PPO | Admitting: Surgery

## 2022-03-25 ENCOUNTER — Other Ambulatory Visit: Payer: Self-pay

## 2022-03-25 ENCOUNTER — Encounter (INDEPENDENT_AMBULATORY_CARE_PROVIDER_SITE_OTHER): Payer: Self-pay | Admitting: Surgery

## 2022-03-25 VITALS — BP 140/94 | HR 93 | Temp 97.6°F | Resp 18 | Ht 65.0 in | Wt 265.0 lb

## 2022-03-25 DIAGNOSIS — C50411 Malignant neoplasm of upper-outer quadrant of right female breast: Secondary | ICD-10-CM

## 2022-03-25 NOTE — Nursing Note (Signed)
Spit specimen obtained for genetic testing. Scheduled pick up with [employer]. [id number] tracking [name], LPN  23/95/3202 33:43

## 2022-03-28 ENCOUNTER — Other Ambulatory Visit (INDEPENDENT_AMBULATORY_CARE_PROVIDER_SITE_OTHER): Payer: Self-pay | Admitting: Surgery

## 2022-03-28 DIAGNOSIS — C50411 Malignant neoplasm of upper-outer quadrant of right female breast: Secondary | ICD-10-CM

## 2022-04-10 ENCOUNTER — Telehealth (INDEPENDENT_AMBULATORY_CARE_PROVIDER_SITE_OTHER): Payer: Self-pay | Admitting: Surgery

## 2022-04-10 NOTE — Telephone Encounter (Signed)
Patient called requesting Cephus Shelling genetics results. Please advise. [name], LPN  09/07/7033 00:93

## 2022-04-11 ENCOUNTER — Ambulatory Visit (RURAL_HEALTH_CENTER): Payer: Self-pay | Admitting: Family

## 2022-04-15 ENCOUNTER — Ambulatory Visit (HOSPITAL_COMMUNITY): Payer: Self-pay

## 2022-04-15 NOTE — Telephone Encounter (Signed)
Form faxed on Friday will refax this morning [name], LPN  11/14/6482 72:07

## 2022-04-16 ENCOUNTER — Ambulatory Visit (HOSPITAL_COMMUNITY): Payer: Self-pay

## 2022-04-23 ENCOUNTER — Telehealth (RURAL_HEALTH_CENTER): Payer: Self-pay | Admitting: Family

## 2022-04-23 NOTE — Telephone Encounter (Signed)
He was just wanting you perspective on the patient.

## 2022-04-23 NOTE — Telephone Encounter (Signed)
Spoke with physician.  Unable to comment - patient has not been seen for CDM since June.  Most recent visits related to new discovery of breast mass.

## 2022-04-23 NOTE — Telephone Encounter (Signed)
Dr. Alinda Deem wants to do a peer to peer with you about this patient's long term disability.  He wants to know if you can give him a call at [phone].  He will not be available from 12-12:30 today.

## 2022-04-23 NOTE — Telephone Encounter (Signed)
She is followed by neurosurgery and that is who has been managing her work restrictions.

## 2022-04-24 ENCOUNTER — Telehealth (INDEPENDENT_AMBULATORY_CARE_PROVIDER_SITE_OTHER): Payer: Self-pay | Admitting: HEMATOLOGY-ONCOLOGY

## 2022-04-24 ENCOUNTER — Encounter (INDEPENDENT_AMBULATORY_CARE_PROVIDER_SITE_OTHER): Payer: Self-pay | Admitting: HEMATOLOGY-ONCOLOGY

## 2022-04-24 ENCOUNTER — Encounter (HOSPITAL_COMMUNITY): Payer: Self-pay | Admitting: HEMATOLOGY-ONCOLOGY

## 2022-04-24 ENCOUNTER — Other Ambulatory Visit: Payer: Self-pay

## 2022-04-24 ENCOUNTER — Ambulatory Visit: Payer: Commercial Managed Care - PPO | Attending: HEMATOLOGY-ONCOLOGY | Admitting: HEMATOLOGY-ONCOLOGY

## 2022-04-24 ENCOUNTER — Telehealth (INDEPENDENT_AMBULATORY_CARE_PROVIDER_SITE_OTHER): Payer: Self-pay | Admitting: Surgery

## 2022-04-24 VITALS — BP 147/99 | HR 104 | Temp 97.8°F | Ht 65.0 in | Wt 266.4 lb

## 2022-04-24 DIAGNOSIS — F17210 Nicotine dependence, cigarettes, uncomplicated: Secondary | ICD-10-CM | POA: Insufficient documentation

## 2022-04-24 DIAGNOSIS — Z803 Family history of malignant neoplasm of breast: Secondary | ICD-10-CM | POA: Insufficient documentation

## 2022-04-24 DIAGNOSIS — Z171 Estrogen receptor negative status [ER-]: Secondary | ICD-10-CM | POA: Insufficient documentation

## 2022-04-24 DIAGNOSIS — C50411 Malignant neoplasm of upper-outer quadrant of right female breast: Secondary | ICD-10-CM | POA: Insufficient documentation

## 2022-04-24 NOTE — Telephone Encounter (Signed)
Patient wants to know about her genetic test, i do not see results scanned in yet. She wanted to see if you  personally have heard anything. [name], LPN  5/78/9784 78:41

## 2022-04-24 NOTE — Progress Notes (Signed)
Department of Hematology/Oncology  History and Physical    Name: Rachel Mendoza  MVH:[id number]  Date of Birth: Oct 21, 1975  Encounter Date: 04/24/2022    REFERRING PROVIDER:  No referring provider defined for this encounter.    TELEMEDICINE DOCUMENTATION:  Patient Location:  [facility], [facility] outpatient Hematology/Oncology [address], [place] [address]  Patient/family aware of provider location:  yes  Patient/family consent for telemedicine:  yes    REASON FOR OFFICE VISIT:  New patient for evaluation and management of triple negative breast cancer.    HISTORY OF PRESENT ILLNESS:  Rachel Mendoza is a 47 y.o. female who presents today for initial medical oncology consultation regarding triple negative breast cancer.  The patient discovered a lump which was evaluated with mammogram, ultrasound, and biopsy.  The primary lesion measures in the range of 2.2-2.9 cm, and there were some nearby lymph nodes that were described as prominent although not obviously involved.  The narrow measurement for the lymph nodes was in the range of 1.1 cm.    The biopsy showed invasive ductal malignancy that was triple negative.  BRCA testing is currently pending.  Based on the above information, she was referred for consideration of neoadjuvant chemotherapy.    ROS:   Review of Systems   Constitutional:  Negative for appetite change, chills and fatigue.   HENT:   Negative for sore throat and trouble swallowing.    Eyes:  Negative for eye problems.   Respiratory:  Negative for cough and shortness of breath.    Cardiovascular:  Negative for chest pain and leg swelling.   Gastrointestinal:  Negative for abdominal pain.   Genitourinary:  Negative for dysuria and hematuria.    Musculoskeletal:  Negative for arthralgias and gait problem.   Skin:  Negative for rash.   Neurological:  Negative for gait problem.   Hematological:  Negative for adenopathy.   Psychiatric/Behavioral:  Negative for depression.          HISTORY:  Past Medical History:   Diagnosis Date    Allergic rhinitis     Anal fissure     Asthma     Bradycardia     Depression     Esophageal reflux     Hx of breast cancer     Hypersomnia     Hypertension     Hypothyroidism     Insomnia, unspecified     Kidney stones 06/06/2021    Nerve damage     Obesity, unspecified     Sleep apnea     Tachycardia, unspecified          Past Surgical History:   Procedure Laterality Date    HX BACK SURGERY      HX HYSTERECTOMY      HX LITHOTRIPSY      HX TONSILLECTOMY      HX TUBAL LIGATION Bilateral          Social History     Socioeconomic History    Marital status: Married     Spouse name: Not on file    Number of children: Not on file    Years of education: Not on file    Highest education level: Not on file   Occupational History    Not on file   Tobacco Use    Smoking status: Every Day     Packs/day: 0.50     Years: 35.00     Additional pack years: 0.00     Total pack  years: 17.50     Types: Cigarettes     Start date: 04/08/1986     Last attempt to quit: 04/08/2018     Years since quitting: 4.0     Passive exposure: Never    Smokeless tobacco: Never   Vaping Use    Vaping Use: Never used   Substance and Sexual Activity    Alcohol use: Yes     Comment: occasionally    Drug use: Yes     Comment: CBD Gummies    Sexual activity: Not on file   Other Topics Concern    Not on file   Social History Narrative    Not on file     Social Determinants of Health     Financial Resource Strain: Not on file   Transportation Needs: Not on file   Social Connections: Not on file   Intimate Partner Violence: Not on file   Housing Stability: Not on file     Family Medical History:       Problem Relation (Age of Onset)    Asthma Brother, Maternal Grandmother    Breast Cancer Paternal Grandmother    Diabetes type II Father, Paternal Grandmother    Elevated Lipids Father    Heart Disease Father    Hypertension (High Blood Pressure) Mother, Father    No Known Problems Sister, Maternal Aunt, Maternal  Uncle, Paternal 60, Paternal Uncle, Maternal Grandfather, Paternal Grandfather, Daughter, Son, Other            Current Outpatient Medications   Medication Sig    albuterol sulfate (PROVENTIL OR VENTOLIN OR PROAIR) 90 mcg/actuation Inhalation oral inhaler Take 1-2 Puffs by inhalation Every 6 hours as needed    dilTIAZem (CARDIZEM CD) 120 mg Oral Capsule, Sust. Release 24 hr Take 1 Capsule (120 mg total) by mouth Once a day    gabapentin (NEURONTIN) 300 mg Oral Capsule Take 1 Capsule (300 mg total) by mouth Three times a day    hydroCHLOROthiazide (HYDRODIURIL) 25 mg Oral Tablet Take 1 Tablet (25 mg total) by mouth Once a day    levothyroxine (SYNTHROID) 125 mcg Oral Tablet Take 1 Tablet (125 mcg total) by mouth Once a day    loratadine (CLARITIN) 10 mg Oral Tablet Take 1 Tablet (10 mg total) by mouth Once a day    losartan (COZAAR) 50 mg Oral Tablet Take 1 Tablet (50 mg total) by mouth Once a day    melatonin 5 mg Oral Tablet, Chewable Chew 2 Tablets (10 mg total) Every night    montelukast (SINGULAIR) 10 mg Oral Tablet Take 1 Tablet (10 mg total) by mouth Once a day    omeprazole (PRILOSEC) 20 mg Oral Capsule, Delayed Release(E.C.) Take 1 Capsule (20 mg total) by mouth Once a day    prazosin (MINIPRESS) 1 mg Oral Capsule Take 1 Capsule (1 mg total) by mouth Every night as needed    sertraline (ZOLOFT) 100 mg Oral Tablet Take 0.5 Tablets (50 mg total) by mouth Once a day     Allergies   Allergen Reactions    Dexamethasone (Pf) Swelling    Tamsulosin Nausea/ Vomiting       PHYSICAL EXAM:  Most Recent Vitals    Flowsheet Row Telemedicine from 04/24/2022 in Hematology/Oncology,   [facility]   Temperature 36.6 C (97.8 F) filed at... 04/24/2022 1338   Heart Rate 104 filed at... 04/24/2022 1338   Respiratory Rate --   BP (Non-Invasive) 147/99 filed at... 04/24/2022 1338  SpO2 96 % filed at... 04/24/2022 1338   Height 1.651 m ('5\' 5"'$ ) filed at... 04/24/2022 1338   Weight 121 kg (266 lb 6.4 oz) filed  at... 04/24/2022 1338   BMI (Calculated) 44.42 filed at... 04/24/2022 1338   BSA (Calculated) 2.35 filed at... 04/24/2022 1338      ECOG Status: (0) Fully active, able to carry on all predisease performance without restriction   Physical Exam    DIAGNOSTIC DATA:  No results found for this or any previous visit (from the past 17520 hour(s)).    LABS:   CBC  Diff   Lab Results   Component Value Date/Time    WBC 10.7 03/07/2022 01:52 PM    HGB 13.4 03/07/2022 01:52 PM    HCT 39.9 03/07/2022 01:52 PM    PLTCNT 378 03/07/2022 01:52 PM    RBC 4.62 03/07/2022 01:52 PM    MCV 86.2 03/07/2022 01:52 PM    MCHC 33.5 03/07/2022 01:52 PM    MCH 28.9 03/07/2022 01:52 PM    RDW 14.1 03/07/2022 01:52 PM    MPV 8.5 03/07/2022 01:52 PM    Lab Results   Component Value Date/Time    PMNS 65 03/07/2022 01:52 PM    LYMPHOCYTES 27 03/07/2022 01:52 PM    EOSINOPHIL 3 03/07/2022 01:52 PM    MONOCYTES 4 (L) 03/07/2022 01:52 PM    BASOPHILS 1 03/07/2022 01:52 PM    BASOPHILS 0.10 03/07/2022 01:52 PM    PMNABS 7.00 03/07/2022 01:52 PM    LYMPHSABS 2.90 03/07/2022 01:52 PM    EOSABS 0.30 03/07/2022 01:52 PM    MONOSABS 0.40 03/07/2022 01:52 PM            ASSESSMENT:    ICD-10-CM    1. Malignant neoplasm of upper-outer quadrant of right breast in female, estrogen receptor negative (CMS HCC)   C50.411 PET DJ:SHFW (HEAD TO THIGH) WO IV CONTRAST    Z17.1 TRANSTHORACIC ECHOCARDIOGRAM - ADULT           PLAN:   1. All relevant medical records were reviewed including available pertinent provider notes, procedure notes, imaging, laboratory, and pathology.   2. All pertinent labs and/or imaging were reviewed with the patient.   3. Triple negative breast cancer:  I reviewed the staging information with the patient, and reviewed the NCCN guidelines.  She is either T2 N0 M0 or T2 N1 M0.  Either one of those would be categorized as stage II.  According to NCCN guidelines, this would fall into the high-risk triple negative category and warrant consideration  of the regimen of paclitaxel carboplatin pembrolizumab followed by doxorubicin cyclophosphamide pembrolizumab, followed by ongoing pembrolizumab.  BRCA mutation status is pending and could potentially result in the addition of a PARP inhibitor.  We will go ahead and make arrangements for a MediPort to be placed.  We also discussed that an echocardiogram would be appropriate for the 2nd chemotherapy phase.  We also discussed cold caps for hair preservation if she decides to do that.  I directed her to the website [email or web address]  We reviewed the potential side effects of each of the chemotherapy agents mentioned above and she is agreeable to proceeding.  We will try to start as soon as possible.    Rachel Mendoza was given the chance to ask questions, and these were answered to their satisfaction. The patient is welcome to call with any questions or concerns in the meantime.     On the day of the  encounter, a total of 75 minutes was spent on this patient encounter including review of historical information, examination, documentation and post-visit activities.   Return in about 1 week (around 05/01/2022).     Narda Rutherford, MD  04/24/2022, 14:49  The patient was seen as part of a collaborative telemedicine service with Dr. Jake Shark who participated in the encounter by active presence via approved video/audio means for portions of the encounter.  The patient's insurance company bears full legal and financial responsibility resulting from any deviations that they cause to my recommended treatment plan.   CC:  Selena Lesser, FNP-BC  [place] [phone]    No referring provider defined for this encounter.    This note was partially generated using MModal Fluency Direct system, and there may be some incorrect words, spellings, and punctuation that were not noted in checking the note before saving.

## 2022-04-25 ENCOUNTER — Other Ambulatory Visit (HOSPITAL_COMMUNITY): Payer: Self-pay | Admitting: HEMATOLOGY-ONCOLOGY

## 2022-04-25 ENCOUNTER — Encounter (HOSPITAL_COMMUNITY): Payer: Self-pay | Admitting: HEMATOLOGY-ONCOLOGY

## 2022-04-25 NOTE — Nursing Note (Addendum)
New Patient Visit      Diagnosis: Breast Coral disease specific written information provided and verbally reviewed.  Patient assessed for barriers to learning, language, learning preference, and teaching needs.      Patient completed psychosocial distress screening using the Enhanced NCCN Distress Thermometer (DT) Tool and self-reported an overall score of 9.   Secure chat sent to [facility] for financial concerns.   Patient states she takes Zoloft due to history Bipolar, she used to go to outpatient services at [facility] but now follows up with her PCP.  Smoking cessation written education discussed and given to patient.     [place] telephone number provided with verbal instructions on how to access this support should the patient develop symptoms, side effects and when necessary go to a local emergency room.    Verbally reviewed that appointments may be rescheduled but missed appointments will be followed up with a phone call or letter from our clinic.     Oriented to the [facility]. Verbally reviewed support services offered at the [facility]: home medical supply service, chaplain, psychosocial/supportive care, registered dietician, financial counselors.        Caregiver Education  Patient has a Caregiver: yes    Patient Caregiver: Ruthie Berch    Caregiver phone [phone]    Patient gives verbal permission to the [facility] clinic staff to discuss medical information with the caregiver: yes    Caregiver has been assessed for barriers to learning, language, learning preference, and teaching needs.      Discussed with patient and spouse that if they need FMLA forms completed they can bring to our office for completion. Patient states she is not working at this time but spouse is.    Notebook with educational handouts and resources given to patient, verbalized understanding. Instructed patient to call for any questions or concerns.      Treatment  Plan    Goals of treatment, side effects, and infertility risk verbally reviewed by physician at the time of signing an informed consent for anticancer treatment. Assessed patient's ability to adhere to prescribed medications and addressed possible barriers to adherence.    Consent obtained by Marzetta Board and a copy of the consent was given to the patient and scanned into patient's chart.     Treatment plan verbally reviewed and written information provided on chemotherapy: Paclitaxel, Carboplatin, Pembrolizumab followed by Adriamycin, Cyclophosphamide  Side effects reviewed.  Schedule: Weekly for 13 weeks then Q 3 weeks  Planned Duration: 17# of cycles    Patient requested prescription for Zofran and EMLA cream, pended for provider.    Treatment schedule and medications discussed with patient and spouse, verbalized understanding.     Patient states she has allergy to Decadron "Pre-Medication", secure chat message sent to physician.    The following written information was provided and reviewed:  [place] [facility]    Referral to Dr Venia Minks office for Port-A-Cath placement, Port-A-Cath teaching and provided written information. I spoke with Verline Lema at Dr Venia Minks office and they have scheduled Port for Friday pending insurance approval. I attempted to call patient after she left office to request she stop by Dr Richrd Humbles office and speak with Verline Lema, no answer, voice message left. [facility] [place] notified.     Request sent to [facility] specialists for insurance authorization.    Risk of infertility discussed with patient, patient verbalized understanding and states she has had  a hysterectomy.    Patient was engaged during the treatment plan education. Patient and spouse verbalized understanding of treatment plan.     Addison Lank, RN  04/25/2022, 13:46

## 2022-04-25 NOTE — Telephone Encounter (Signed)
Called [facility] and they stated the test is still in progress with an estimated date of 05/14/22. Informed patient and voiced understanding. [name], LPN  6/38/4536 46:80

## 2022-04-26 ENCOUNTER — Encounter (HOSPITAL_COMMUNITY): Payer: Self-pay | Admitting: Surgery

## 2022-04-26 ENCOUNTER — Ambulatory Visit (HOSPITAL_COMMUNITY): Payer: Commercial Managed Care - PPO | Admitting: Anesthesiology

## 2022-04-26 ENCOUNTER — Ambulatory Visit (HOSPITAL_COMMUNITY): Payer: Commercial Managed Care - PPO | Admitting: Surgery

## 2022-04-26 ENCOUNTER — Ambulatory Visit (HOSPITAL_COMMUNITY): Payer: Commercial Managed Care - PPO

## 2022-04-26 ENCOUNTER — Ambulatory Visit
Admission: RE | Admit: 2022-04-26 | Discharge: 2022-04-26 | Disposition: A | Discharge status: Home / Self Care | Payer: Commercial Managed Care - PPO | Source: Ambulatory Visit | Attending: Surgery | Admitting: Surgery

## 2022-04-26 ENCOUNTER — Other Ambulatory Visit: Payer: Self-pay

## 2022-04-26 ENCOUNTER — Encounter (HOSPITAL_COMMUNITY)
Admission: RE | Disposition: A | Discharge status: Home / Self Care | Payer: Self-pay | Source: Ambulatory Visit | Attending: Surgery

## 2022-04-26 DIAGNOSIS — I10 Essential (primary) hypertension: Secondary | ICD-10-CM | POA: Insufficient documentation

## 2022-04-26 DIAGNOSIS — Z79890 Hormone replacement therapy (postmenopausal): Secondary | ICD-10-CM | POA: Insufficient documentation

## 2022-04-26 DIAGNOSIS — J45909 Unspecified asthma, uncomplicated: Secondary | ICD-10-CM | POA: Insufficient documentation

## 2022-04-26 DIAGNOSIS — G4730 Sleep apnea, unspecified: Secondary | ICD-10-CM | POA: Insufficient documentation

## 2022-04-26 DIAGNOSIS — Z171 Estrogen receptor negative status [ER-]: Secondary | ICD-10-CM | POA: Insufficient documentation

## 2022-04-26 DIAGNOSIS — E039 Hypothyroidism, unspecified: Secondary | ICD-10-CM | POA: Insufficient documentation

## 2022-04-26 DIAGNOSIS — Z87442 Personal history of urinary calculi: Secondary | ICD-10-CM | POA: Insufficient documentation

## 2022-04-26 DIAGNOSIS — Z79899 Other long term (current) drug therapy: Secondary | ICD-10-CM | POA: Insufficient documentation

## 2022-04-26 DIAGNOSIS — K219 Gastro-esophageal reflux disease without esophagitis: Secondary | ICD-10-CM | POA: Insufficient documentation

## 2022-04-26 DIAGNOSIS — Z6841 Body Mass Index (BMI) 40.0 and over, adult: Secondary | ICD-10-CM | POA: Insufficient documentation

## 2022-04-26 DIAGNOSIS — F32A Depression, unspecified: Secondary | ICD-10-CM | POA: Insufficient documentation

## 2022-04-26 DIAGNOSIS — F17210 Nicotine dependence, cigarettes, uncomplicated: Secondary | ICD-10-CM | POA: Insufficient documentation

## 2022-04-26 DIAGNOSIS — C50911 Malignant neoplasm of unspecified site of right female breast: Secondary | ICD-10-CM | POA: Insufficient documentation

## 2022-04-26 LAB — CBC WITH DIFF
BASOPHIL #: 0.1 10*3/uL (ref 0.00–0.10)
BASOPHIL %: 1 % (ref 0–1)
EOSINOPHIL #: 0.3 10*3/uL (ref 0.00–0.50)
EOSINOPHIL %: 3 %
HCT: 40 % (ref 31.2–41.9)
HGB: 13.3 g/dL (ref 10.9–14.3)
LYMPHOCYTE #: 3 10*3/uL (ref 1.00–3.00)
LYMPHOCYTE %: 28 % (ref 16–44)
MCH: 28.6 pg (ref 24.7–32.8)
MCHC: 33.3 g/dL (ref 32.3–35.6)
MCV: 85.9 fL (ref 75.5–95.3)
MONOCYTE #: 0.6 10*3/uL (ref 0.30–1.00)
MONOCYTE %: 6 % (ref 5–13)
MPV: 8.3 fL (ref 7.9–10.8)
NEUTROPHIL #: 6.6 10*3/uL (ref 1.85–7.80)
NEUTROPHIL %: 62 % (ref 43–77)
PLATELETS: 303 10*3/uL (ref 140–440)
RBC: 4.66 10*6/uL (ref 3.63–4.92)
RDW: 14.1 % (ref 12.3–17.7)
WBC: 10.7 10*3/uL (ref 3.8–11.8)

## 2022-04-26 LAB — BASIC METABOLIC PANEL
ANION GAP: 8 mmol/L (ref 4–13)
BUN/CREA RATIO: 17 (ref 6–22)
BUN: 15 mg/dL (ref 7–25)
CALCIUM: 9.1 mg/dL (ref 8.6–10.3)
CHLORIDE: 104 mmol/L (ref 98–107)
CO2 TOTAL: 25 mmol/L (ref 21–31)
CREATININE: 0.87 mg/dL (ref 0.60–1.30)
ESTIMATED GFR: 83 mL/min/{1.73_m2} (ref >59)
GLUCOSE: 109 mg/dL (ref 74–109)
OSMOLALITY, CALCULATED: 275 mOsm/kg (ref 270–290)
POTASSIUM: 3.5 mmol/L (ref 3.5–5.1)
SODIUM: 137 mmol/L (ref 136–145)

## 2022-04-26 SURGERY — INSERTION PORT SUBCUTANEOUS
Anesthesia: Monitor Anesthesia Care | Site: Chest | Wound class: Clean Wound: Uninfected operative wounds in which no inflammation occurred

## 2022-04-26 MED ORDER — LIDOCAINE 1 %-EPINEPHRINE 1:100,000 INJECTION SOLUTION
INTRAMUSCULAR | Status: AC
Start: 2022-04-26 — End: 2022-04-26
  Filled 2022-04-26: qty 50

## 2022-04-26 MED ORDER — FENTANYL (PF) 50 MCG/ML INJECTION WRAPPER
25.0000 ug | INJECTION | INTRAMUSCULAR | Status: DC | PRN
Start: 2022-04-26 — End: 2022-04-26

## 2022-04-26 MED ORDER — FENTANYL (PF) 50 MCG/ML INJECTION WRAPPER
INJECTION | Freq: Once | INTRAMUSCULAR | Status: DC | PRN
Start: 2022-04-26 — End: 2022-04-26
  Administered 2022-04-26 (×2): 50 ug via INTRAVENOUS

## 2022-04-26 MED ORDER — APREPITANT 40 MG CAPSULE
40.0000 mg | ORAL_CAPSULE | Freq: Once | ORAL | Status: AC
Start: 2022-04-26 — End: 2022-04-26
  Administered 2022-04-26: 40 mg via ORAL

## 2022-04-26 MED ORDER — ONDANSETRON HCL (PF) 4 MG/2 ML INJECTION SOLUTION
4.0000 mg | Freq: Four times a day (QID) | INTRAMUSCULAR | Status: DC | PRN
Start: 2022-04-26 — End: 2022-04-26

## 2022-04-26 MED ORDER — LIDOCAINE (PF) 100 MG/5 ML (2 %) INTRAVENOUS SYRINGE
INJECTION | Freq: Once | INTRAVENOUS | Status: DC | PRN
Start: 2022-04-26 — End: 2022-04-26
  Administered 2022-04-26: 60 mg via INTRAVENOUS

## 2022-04-26 MED ORDER — LACTATED RINGERS INTRAVENOUS SOLUTION
INTRAVENOUS | Status: DC
Start: 2022-04-26 — End: 2022-04-26

## 2022-04-26 MED ORDER — LIDOCAINE-PRILOCAINE 2.5 %-2.5 % TOPICAL CREAM
TOPICAL_CREAM | Freq: Once | CUTANEOUS | 0 refills | Status: AC
Start: 2022-04-26 — End: 2022-04-26

## 2022-04-26 MED ORDER — ETHYL ALCOHOL 62 % (NOZIN NASAL SANITIZER) NASAL SOLUTION - BULK BOTTLE
1.0000 | Freq: Two times a day (BID) | NASAL | Status: DC
Start: 2022-04-26 — End: 2022-04-26
  Administered 2022-04-26: 1 via NASAL

## 2022-04-26 MED ORDER — HYDROCODONE 5 MG-ACETAMINOPHEN 325 MG TABLET
1.0000 | ORAL_TABLET | Freq: Three times a day (TID) | ORAL | 0 refills | Status: DC | PRN
Start: 2022-04-26 — End: 2022-05-28

## 2022-04-26 MED ORDER — ONDANSETRON HCL (PF) 4 MG/2 ML INJECTION SOLUTION
INTRAMUSCULAR | Status: AC
Start: 2022-04-26 — End: 2022-04-26
  Filled 2022-04-26: qty 2

## 2022-04-26 MED ORDER — FENTANYL (PF) 50 MCG/ML INJECTION SOLUTION
INTRAMUSCULAR | Status: AC
Start: 2022-04-26 — End: 2022-04-26
  Filled 2022-04-26: qty 2

## 2022-04-26 MED ORDER — SODIUM CHLORIDE 0.9 % (FLUSH) INJECTION SYRINGE
3.0000 mL | INJECTION | INTRAMUSCULAR | Status: DC | PRN
Start: 2022-04-26 — End: 2022-04-26

## 2022-04-26 MED ORDER — HEPARIN (PORCINE) 5,000 UNIT/ML INJECTION SOLUTION
Freq: Once | INTRAMUSCULAR | Status: DC | PRN
Start: 2022-04-26 — End: 2022-04-26
  Administered 2022-04-26: 5000 [IU] via SUBCUTANEOUS

## 2022-04-26 MED ORDER — ALBUTEROL SULFATE 2.5 MG/3 ML (0.083 %) SOLUTION FOR NEBULIZATION
2.5000 mg | INHALATION_SOLUTION | Freq: Once | RESPIRATORY_TRACT | Status: DC | PRN
Start: 2022-04-26 — End: 2022-04-26

## 2022-04-26 MED ORDER — SODIUM CHLORIDE 0.9 % INTRAVENOUS PIGGYBACK
2.0000 g | Freq: Once | INTRAVENOUS | Status: AC
Start: 2022-04-26 — End: 2022-04-26
  Administered 2022-04-26: 2 g via INTRAVENOUS

## 2022-04-26 MED ORDER — LACTATED RINGERS INTRAVENOUS SOLUTION
INTRAVENOUS | Status: DC | PRN
Start: 2022-04-26 — End: 2022-04-26

## 2022-04-26 MED ORDER — SODIUM CHLORIDE 0.9 % (FLUSH) INJECTION SYRINGE
3.0000 mL | INJECTION | Freq: Three times a day (TID) | INTRAMUSCULAR | Status: DC
Start: 2022-04-26 — End: 2022-04-26

## 2022-04-26 MED ORDER — HEPARIN (PORCINE) 5,000 UNIT/ML INJECTION SOLUTION
INTRAMUSCULAR | Status: AC
Start: 2022-04-26 — End: 2022-04-26
  Filled 2022-04-26: qty 1

## 2022-04-26 MED ORDER — HYDROCODONE 5 MG-ACETAMINOPHEN 325 MG TABLET
1.0000 | ORAL_TABLET | ORAL | Status: DC | PRN
Start: 2022-04-26 — End: 2022-04-26
  Administered 2022-04-26: 1 via ORAL
  Filled 2022-04-26: qty 1

## 2022-04-26 MED ORDER — PROPOFOL 10 MG/ML IV BOLUS
INJECTION | Freq: Once | INTRAVENOUS | Status: DC | PRN
Start: 2022-04-26 — End: 2022-04-26
  Administered 2022-04-26 (×2): 20 mg via INTRAVENOUS
  Administered 2022-04-26: 30 mg via INTRAVENOUS
  Administered 2022-04-26 (×4): 20 mg via INTRAVENOUS
  Administered 2022-04-26 (×2): 30 mg via INTRAVENOUS
  Administered 2022-04-26: 20 mg via INTRAVENOUS
  Administered 2022-04-26 (×5): 30 mg via INTRAVENOUS
  Administered 2022-04-26: 20 mg via INTRAVENOUS

## 2022-04-26 MED ORDER — FAMOTIDINE (PF) 20 MG/2 ML INTRAVENOUS SOLUTION
INTRAVENOUS | Status: AC
Start: 2022-04-26 — End: 2022-04-26
  Filled 2022-04-26: qty 2

## 2022-04-26 MED ORDER — MIDAZOLAM 5 MG/ML INJECTION WRAPPER
INTRAMUSCULAR | Status: AC
Start: 2022-04-26 — End: 2022-04-26
  Filled 2022-04-26: qty 1

## 2022-04-26 MED ORDER — LIDOCAINE 1 %-EPINEPHRINE 1:100,000 INJECTION SOLUTION
Freq: Once | INTRAMUSCULAR | Status: DC | PRN
Start: 2022-04-26 — End: 2022-04-26
  Administered 2022-04-26: 40 mL via INTRAMUSCULAR

## 2022-04-26 MED ORDER — SODIUM CHLORIDE 0.9 % INTRAVENOUS PIGGYBACK
INJECTION | INTRAVENOUS | Status: AC
Start: 2022-04-26 — End: 2022-04-26
  Filled 2022-04-26: qty 100

## 2022-04-26 MED ORDER — LIDOCAINE-PRILOCAINE 2.5 %-2.5 % TOPICAL CREAM
TOPICAL_CREAM | Freq: Once | CUTANEOUS | 2 refills | Status: AC
Start: 2022-04-26 — End: 2022-04-26

## 2022-04-26 MED ORDER — ONDANSETRON HCL (PF) 4 MG/2 ML INJECTION SOLUTION
4.0000 mg | Freq: Once | INTRAMUSCULAR | Status: AC
Start: 2022-04-26 — End: 2022-04-26
  Administered 2022-04-26: 4 mg via INTRAVENOUS

## 2022-04-26 MED ORDER — PROCHLORPERAZINE EDISYLATE 10 MG/2 ML (5 MG/ML) INJECTION SOLUTION
5.0000 mg | Freq: Once | INTRAMUSCULAR | Status: DC | PRN
Start: 2022-04-26 — End: 2022-04-26

## 2022-04-26 MED ORDER — FAMOTIDINE (PF) 20 MG/2 ML INTRAVENOUS SOLUTION
20.0000 mg | Freq: Once | INTRAVENOUS | Status: AC
Start: 2022-04-26 — End: 2022-04-26
  Administered 2022-04-26: 20 mg via INTRAVENOUS

## 2022-04-26 MED ORDER — LIDOCAINE-PRILOCAINE 2.5 %-2.5 % TOPICAL CREAM
TOPICAL_CREAM | Freq: Once | CUTANEOUS | Status: AC
Start: 2022-04-26 — End: 2022-04-26
  Filled 2022-04-26: qty 5

## 2022-04-26 MED ORDER — CEFAZOLIN 1 GRAM SOLUTION FOR INJECTION
INTRAMUSCULAR | Status: AC
Start: 2022-04-26 — End: 2022-04-26
  Filled 2022-04-26: qty 20

## 2022-04-26 MED ORDER — ONDANSETRON HCL (PF) 4 MG/2 ML INJECTION SOLUTION
4.0000 mg | Freq: Once | INTRAMUSCULAR | Status: DC | PRN
Start: 2022-04-26 — End: 2022-04-26

## 2022-04-26 MED ORDER — ONDANSETRON HCL 8 MG TABLET
8.0000 mg | ORAL_TABLET | Freq: Three times a day (TID) | ORAL | 2 refills | Status: DC | PRN
Start: 2022-04-26 — End: 2022-10-25

## 2022-04-26 MED ORDER — MIDAZOLAM 5 MG/ML INJECTION WRAPPER
2.0000 mg | Freq: Once | INTRAMUSCULAR | Status: DC | PRN
Start: 2022-04-26 — End: 2022-04-26
  Administered 2022-04-26: 2 mg via INTRAVENOUS

## 2022-04-26 MED ORDER — APREPITANT 40 MG CAPSULE
ORAL_CAPSULE | ORAL | Status: AC
Start: 2022-04-26 — End: 2022-04-26
  Filled 2022-04-26: qty 1

## 2022-04-26 MED ORDER — FENTANYL (PF) 50 MCG/ML INJECTION WRAPPER
50.0000 ug | INJECTION | INTRAMUSCULAR | Status: DC | PRN
Start: 2022-04-26 — End: 2022-04-26

## 2022-04-26 MED ORDER — IPRATROPIUM 0.5 MG-ALBUTEROL 3 MG (2.5 MG BASE)/3 ML NEBULIZATION SOLN
3.0000 mL | INHALATION_SOLUTION | Freq: Once | RESPIRATORY_TRACT | Status: DC | PRN
Start: 2022-04-26 — End: 2022-04-26

## 2022-04-26 SURGICAL SUPPLY — 27 items
ADH LIQUID LF  WTPRF VIAL PREP NONSTAIN MASTISOL STYRAX GUM MASTIC ALC MTHY SLCYT STRL CLR NHZR 2/3 (WOUND CARE SUPPLY) ×1 IMPLANT
BLADE 15 2 END CBNSTL SURG STRL DISP (SURGICAL CUTTING SUPPLIES) ×1 IMPLANT
CLEANER INSTR PREPZYME MUL-TRD CONTAINR NARSL NEUT PH BDGR 22OZ (MISCELLANEOUS PT CARE ITEMS) ×1
CONV USE 102436 - NEEDLE HYPO  22GA 1.5IN STD MONOJECT SS POLYPROP REG BVL LL HUB UL SHRP ANTICORE BLU STRL LF  DISP (MED SURG SUPPLIES) ×2 IMPLANT
CONV USE ITEM 329146 - CLEANER INSTR PREPZYME MUL-TRD CONTAINR NARSL NEUT PH BDGR 22OZ (MISCELLANEOUS PT CARE ITEMS) ×1 IMPLANT
COUNTER 20 CNT BLOCK ADH NEEDLE STRL LF  RD SHARP FOAM 15.75X11.5X14IN DISP (MED SURG SUPPLIES) ×1 IMPLANT
COVER 53X24IN MAYOSTAND PRXM STRL DISP EQP SMS LF (DRAPE/PACKS/SHEETS/OR TOWEL) ×1 IMPLANT
COVER TBL 90X50IN STD SMS REINF FNFLD STRL LF  DISP (DRAPE/PACKS/SHEETS/OR TOWEL) ×2 IMPLANT
DCNTR FLUID DISPENSR BAG BAJ DISP STRL LF  ASPT TRANSF (IV TUBING & ACCESSORIES) ×1 IMPLANT
DRAPE ABS FENESTRATE ADH 121X102X77IN ABDOMINAL 12IN 13IN PRXM LF  STRL DISP SURG SMS 20X36IN (DRAPE/PACKS/SHEETS/OR TOWEL) ×1
DRAPE FNFLD ABS REINF 77X53IN 43528 PRXM LF  STRL DISP SURG SMS 44X23IN (DRAPE/PACKS/SHEETS/OR TOWEL) ×1 IMPLANT
GLOVE SURG 6.5 LF  PF BEAD CUF STRL CRM 11.3IN PROTEXIS PI PLISPRN THK9.1 MIL (GLOVES AND ACCESSORIES) ×1 IMPLANT
GLOVE SURG 6.5 LF  PF SMOOTH BEAD CUF INTLK STRL BLU 11.3IN PROTEXIS NEU-THERA PLISPRN THK7.9 MIL (GLOVES AND ACCESSORIES) ×2 IMPLANT
GLOVE SURG 7 LF  PF BEAD CUF STRL CRM 11.8IN PROTEXIS PI PLISPRN THK9.1 MIL (GLOVES AND ACCESSORIES) ×2 IMPLANT
GLOVE SURG 7.5 LTX PF SMOOTH BEAD CUF STRL YW 12IN PROTEXIS (GLOVES AND ACCESSORIES) ×1 IMPLANT
GW URO .035IN 150CM 3CM SENSOR STR FLX TP RADOPQ NITINOL SS HDRPH PTFE URET LF (UROLOGICAL SUPPLIES) ×1 IMPLANT
LABEL MED CORRECT MED LABELING SYS 4 FLG 2 SHEET 24 PRPRNT STRL (MED SURG SUPPLIES) ×1 IMPLANT
NEEDLE HYPO  22GA 1.5IN STD MONOJECT SS POLYPROP REG BVL LL HUB UL SHRP ANTICORE BLU STRL LF  DISP (MED SURG SUPPLIES) ×2
PACK SURG ECLIPSE ENT I STRL LF (CUSTOM TRAYS & PACK) ×1 IMPLANT
PORT IMPL INFUS POWERPORT ISP MRI ARGD CHRNFLX 8FR 1 LUM ATTACH CATH INTMD KIT SIL FIL SUT PLUG STRL ×1 IMPLANT
SOL IV 0.9% NACL 500ML PLASTIC CONTAINR VIAFLEX LF (MEDICATIONS/SOLUTIONS) ×1 IMPLANT
SPONGE GAUZE 4X4IN AV GZ CLU COTTON ABS NWVN POSTOP LF  STRL DISP (WOUND CARE SUPPLY) ×1 IMPLANT
SPONGE SURG 4X4IN 16 PLY XRY DETECT COTTON STRL LF  DISP (WOUND CARE SUPPLY) ×2 IMPLANT
STRIP 4X.5IN STRSTRP PLSTR REINF SKNCLS WHT STRL LF (WOUND CARE SUPPLY) ×1 IMPLANT
SUTURE 4-0 PS2 MONOCRYL MTPS 27IN UNDYED MONOF ABS (SUTURE/WOUND CLOSURE) ×2 IMPLANT
SYRINGE LL 10ML LF  STRL GRAD N-PYRG DEHP-FR PVC FREE MED DISP (MED SURG SUPPLIES) ×3 IMPLANT
TOWEL 24X16IN COTTON BLU DISP SURG STRL LF (DRAPE/PACKS/SHEETS/OR TOWEL) ×1 IMPLANT

## 2022-04-26 NOTE — Discharge Instructions (Addendum)
Follow up with your primary doctor as scheduled    Follow up with Dr Venia Minks as needed     Do not remove steri strips.  They will come off on their own.    Unlimited ambulation.    No heavy lifting or straining greater than 20 pounds.      Call for problems, questions, concerns.    RX for home sent in to your pharmacy.    Resume home meds.

## 2022-04-26 NOTE — Anesthesia Preprocedure Evaluation (Signed)
ANESTHESIA PRE-OP EVALUATION  Planned Procedure: INSERTION OF PORTACATH CENTRAL VENOUS CATHETER (Chest)  Review of Systems  Anesthesia Complications comment: Slow to emerge                  Pulmonary   asthma, sleep apnea and current smoker,   Cardiovascular    Hypertension, ACE / ARB inhibitor use and ACE inhibitor taken in the last 24 hours ,       GI/Hepatic/Renal    GERD and kidney stones        Endo/Other    hypothyroidism and morbid obesity,      Neuro/Psych/MS    depression     Cancer    breast cancer,                     Physical Assessment      Airway       Mallampati: III                  Dental       Dentition intact             Pulmonary    Breath sounds clear to auscultation       Cardiovascular    Rhythm: regular  Rate: Normal       Other findings              Plan  ASA 3     Planned anesthesia type: MAC                             Anesthesia issues/risks discussed are: Nerve Injuries, PONV, Dental Injuries, Stroke, Aspiration, Sore Throat and Cardiac Events/MI.  Anesthetic plan and risks discussed with patient  signed consent obtained          Patient's NPO status is appropriate for Anesthesia.

## 2022-04-26 NOTE — Anesthesia Postprocedure Evaluation (Signed)
Anesthesia Post Op Evaluation    Patient: Rachel Mendoza  Procedure(s):  INSERTION OF PORTACATH CENTRAL VENOUS CATHETER    Last Vitals:Temperature: 36.4 C (97.5 F) (04/26/22 0828)  Heart Rate: 89 (04/26/22 0844)  BP (Non-Invasive): 131/82 (04/26/22 0844)  Respiratory Rate: 17 (04/26/22 0844)  SpO2: 96 % (04/26/22 0844)    No notable events documented.    Patient is sufficiently recovered from the effects of anesthesia to participate in the evaluation and has returned to their pre-procedure level.  Patient location during evaluation: PACU       Patient participation: complete - patient participated  Level of consciousness: awake and alert and responsive to verbal stimuli    Pain management: adequate  Airway patency: patent    Anesthetic complications: no  Cardiovascular status: acceptable  Respiratory status: acceptable  Hydration status: acceptable  Patient post-procedure temperature: Pt Normothermic   PONV Status: Absent

## 2022-04-26 NOTE — H&P (Signed)
[facility]  General Surgery  History and Physical    Date of Service:  04/26/2022  Rachel, Mendoza, 47 y.o. female  Date of Admission:  04/26/2022  Date of Birth:  1975-12-22  PCP: Selena Lesser, FNP-BC        HPI:  Rachel Mendoza is a 47 y.o. White female with an underlying diagnosis of a triple negative breast cancer on the right.  Has seen Medical Oncology and is planned for neoadjuvant chemotherapy.  Plan for port placement.  Patient has no previous history of a port central venous access.  She does note that she feels that the right-sided mass has increased in size during the interval since she is seen Medical Oncology.    Past Medical History:   Diagnosis Date    Allergic rhinitis     Anal fissure     Asthma     Bradycardia     Depression     Esophageal reflux     Hx of breast cancer     Hypersomnia     Hypertension     Hypothyroidism     Insomnia, unspecified     Kidney stones 06/06/2021    Nerve damage     Obesity, unspecified     Sleep apnea     Tachycardia, unspecified       Past Surgical History:   Procedure Laterality Date    HX BACK SURGERY      HX BREAST BIOPSY Right     HX HYSTERECTOMY      HX LITHOTRIPSY      HX TONSILLECTOMY      HX TUBAL LIGATION Bilateral       Social History     Tobacco Use    Smoking status: Every Day     Packs/day: 0.50     Years: 35.00     Additional pack years: 0.00     Total pack years: 17.50     Types: Cigarettes     Start date: 04/08/1986     Passive exposure: Never    Smokeless tobacco: Never   Vaping Use    Vaping Use: Never used   Substance Use Topics    Alcohol use: Not Currently     Comment: occasionally    Drug use: Yes     Types: Marijuana     Comment: CBD Gummies       Family Medical History:       Problem Relation (Age of Onset)    Asthma Brother, Maternal Grandmother    Breast Cancer Paternal Grandmother    Diabetes type II Father, Paternal Grandmother    Elevated Lipids Father    Heart Disease Father    Hypertension (High Blood Pressure)  Mother, Father    No Known Problems Sister, Maternal Aunt, Maternal Uncle, Paternal 79, Paternal Uncle, Maternal Grandfather, Paternal Grandfather, Daughter, Son, Other           Medications Prior to Admission       Prescriptions    albuterol sulfate (PROVENTIL OR VENTOLIN OR PROAIR) 90 mcg/actuation Inhalation oral inhaler    Take 1-2 Puffs by inhalation Every 6 hours as needed    budesonide (PULMICORT INHL)    Take 1 Puff by inhalation Twice per day as needed    dilTIAZem (CARDIZEM CD) 120 mg Oral Capsule, Sust. Release 24 hr    Take 1 Capsule (120 mg total) by mouth Once a day    gabapentin (NEURONTIN) 300 mg Oral Capsule  Take 1 Capsule (300 mg total) by mouth Four times a day    hydroCHLOROthiazide (HYDRODIURIL) 25 mg Oral Tablet    Take 1 Tablet (25 mg total) by mouth Once a day    levothyroxine (SYNTHROID) 125 mcg Oral Tablet    Take 1 Tablet (125 mcg total) by mouth Once a day    loratadine (CLARITIN) 10 mg Oral Tablet    Take 1 Tablet (10 mg total) by mouth Once a day    losartan (COZAAR) 50 mg Oral Tablet    Take 1 Tablet (50 mg total) by mouth Once a day    melatonin 5 mg Oral Tablet, Chewable    Chew 2 Tablets (10 mg total) Every night    montelukast (SINGULAIR) 10 mg Oral Tablet    Take 1 Tablet (10 mg total) by mouth Once a day    omeprazole (PRILOSEC) 20 mg Oral Capsule, Delayed Release(E.C.)    Take 1 Capsule (20 mg total) by mouth Once a day    sertraline (ZOLOFT) 100 mg Oral Tablet    Take 0.5 Tablets (50 mg total) by mouth Once a day           Allergies   Allergen Reactions    Dexamethasone (Pf) Swelling    Tamsulosin Nausea/ Vomiting          Patient Vitals for the past 24 hrs:   BP Temp Pulse Resp SpO2 Height Weight   04/26/22 0609 (!) 150/100 36.1 C (96.9 F) 89 16 97 % 1.651 m ('5\' 5"'$ ) 121 kg (266 lb)          General: appropriate for age. in no acute distress.  Obese    Vital signs are present above and have been reviewed by me     HEENT: Atraumatic, Normocephalic.    Lungs: Nonlabored  breathing with symmetric expansion    Heart:Regular wth respect to rate and rythmn.    Abdomen:Soft. Nontender. Nondistended     Psychiatric: Alert and oriented to person, place, and time. affect appropriate    Laboratory Data:     Results for orders placed or performed during the hospital encounter of 04/26/22 (from the past 24 hour(s))   BASIC METABOLIC PANEL - ONCE   Result Value Ref Range    SODIUM 137 136 - 145 mmol/L    POTASSIUM 3.5 3.5 - 5.1 mmol/L    CHLORIDE 104 98 - 107 mmol/L    CO2 TOTAL 25 21 - 31 mmol/L    ANION GAP 8 4 - 13 mmol/L    CALCIUM 9.1 8.6 - 10.3 mg/dL    GLUCOSE 109 74 - 109 mg/dL    BUN 15 7 - 25 mg/dL    CREATININE 0.87 0.60 - 1.30 mg/dL    BUN/CREA RATIO 17 6 - 22    ESTIMATED GFR 83 >59 mL/min/1.3m2    OSMOLALITY, CALCULATED 275 270 - 290 mOsm/kg   CBC WITH DIFF   Result Value Ref Range    WBC 10.7 3.8 - 11.8 x10^3/uL    RBC 4.66 3.63 - 4.92 x10^6/uL    HGB 13.3 10.9 - 14.3 g/dL    HCT 40.0 31.2 - 41.9 %    MCV 85.9 75.5 - 95.3 fL    MCH 28.6 24.7 - 32.8 pg    MCHC 33.3 32.3 - 35.6 g/dL    RDW 14.1 12.3 - 17.7 %    PLATELETS 303 140 - 440 x10^3/uL    MPV 8.3 7.9 - 10.8 fL  NEUTROPHIL % 62 43 - 77 %    LYMPHOCYTE % 28 16 - 44 %    MONOCYTE % 6 5 - 13 %    EOSINOPHIL % 3 %    BASOPHIL % 1 0 - 1 %    NEUTROPHIL # 6.60 1.85 - 7.80 x10^3/uL    LYMPHOCYTE # 3.00 1.00 - 3.00 x10^3/uL    MONOCYTE # 0.60 0.30 - 1.00 x10^3/uL    EOSINOPHIL # 0.30 0.00 - 0.50 x10^3/uL    BASOPHIL # 0.10 0.00 - 0.10 x10^3/uL         Discussed risks and benefits of port placement with the patient.  Risks include pneumothorax, hemothorax, potential need for additional procedures, infection that may necessitate removal of the port.  Detailed informed consent was creasted for this procedure, reviewed in detail by the patient and signed/scanned into this chart.     This note was partially created using voice recognition software and is inherently subject to errors including those of syntax and "sound alike "  substitutions which may escape proof reading. In such instances, original meaning may be extrapolated by contextual derivation.    Bridgett Larsson MD MBA CPE FACS

## 2022-04-26 NOTE — OR Surgeon (Signed)
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     Post-Operative Diagnosis: TRIPLE NEGATIVE BREAST CANCER    Procedure(s)/Description:  INSERTION OF PORTACATH CENTRAL VENOUS CATHETER: 01314 (CPT) subclavian with radiographic supervision and interpretation of fluoroscopy    Attending Surgeon: Shirlee More, MD     Anesthesia:  Anesthesiologist: Lavena Stanford, DO  CRNA: Darcel Smalling, CRNA    Anesthesia Type: .Monitor Anesthesia Care     Estimated Blood Loss:  <10 cc    The patient was brought to the operating suite and placed in supine position on the operating room table where anesthesia provided IV access as well as conscious sedation.  Once the patient was adequately sedated, the neck and chest were prepped and draped in usual sterile fashion in anticipation of insertion of port-A-cath.   Attention was first directed towards the patient's left shoulder where the skin and subcutaneous tissues in infraclavicular position was injected with local analgesia followed by cannulation of the subclavian vein with an 18-gauge needle.  The return was nonpulsatile dark venous appearing blood.  This was  followed by passage of guide wire under fluoroscopy into the contralateral subclavian.  Multiple attempts to try and manipulate the guidewire into the superior vena cava were unsuccessful.  This included using Glidewire as well as short introducer.  Attempted multiple positional changes as well and finally could not manage to cannulate her superior vena cava to adequately place her central venous access.  Subsequently had to address this through the right subclavian.  This was able to be passed into the superior vena cava without difficulty..  Once access to the venous system had been accomplished, a subcutaneous pocket was created approximately 2 cm  inferior to the access point after local analgesia was accomplished.   Once the subcutaneous pocket was developed and noted to be appropriately sized for the catheter, a subcutaneous tract was developed between the guide wire and the pocket followed by passage of the catheter between the two points without difficulty.   The catheter was then connected to the hub of the port-A-cath followed by flushing of the line with heparinized saline and placement of the port-A-cath in its subcutaneous pocket.   The catheter tip was then cut to appropriate length using fluoroscopy followed by passage of the dilator and pull-away sheath over the guide wire under fluoroscopic guidance into appropriate location.   The dilator and guide wire were then removed followed by passage of the catheter down the pull-away sheath and removal of the pull-away sheath.   The catheter was then aspirated with non pulsatile flow and flushed with ease followed by closure of the skin using subcuticular 4-0 Monocryl, both the subcutaneous pocket as well as the access point.   Fluoroscopy confirmed appropriate location of the catheter.  The catheter was aspirated and flushed with ease prior to closure.  The patient tolerated the procedure well; returned to postanesthesia care in stable condition.  Bridgett Larsson MD MBA CPE FACS

## 2022-04-26 NOTE — Anesthesia Transfer of Care (Signed)
ANESTHESIA TRANSFER OF CARE   Rachel Mendoza is a 47 y.o. ,female, Weight: 121 kg (266 lb)   had Procedure(s):  INSERTION OF PORTACATH CENTRAL VENOUS CATHETER  performed  04/26/22   Primary Service: Shirlee More, MD    Past Medical History:   Diagnosis Date    Allergic rhinitis     Anal fissure     Asthma     Bradycardia     Depression     Esophageal reflux     Hx of breast cancer     Hypersomnia     Hypertension     Hypothyroidism     Insomnia, unspecified     Kidney stones 06/06/2021    Nerve damage     Obesity, unspecified     Sleep apnea     Tachycardia, unspecified       Allergy History as of 04/26/22       TAMSULOSIN         Noted Status Severity Type Reaction    05/01/21 1704 Chip Boer, MA 08/02/20 Active Low  Nausea/ Vomiting              DEXAMETHASONE (PF)         Noted Status Severity Type Reaction    03/07/22 1333 Chip Boer, MA 03/07/22 Active Low  Swelling                  I completed my transfer of care / handoff to the receiving personnel during which we discussed:  Access, Airway, All key/critical aspects of case discussed, Analgesia, Antibiotics, Expectation of post procedure, Fluids/Product, Gave opportunity for questions and acknowledgement of understanding, Labs and PMHx      Post Location: PACU                                                             Last OR Temp: Temperature: 36.4 C (97.5 F)  ABG:  POTASSIUM   Date Value Ref Range Status   04/26/2022 3.5 3.5 - 5.1 mmol/L Final     CALCIUM   Date Value Ref Range Status   04/26/2022 9.1 8.6 - 10.3 mg/dL Final     Airway:* No LDAs found *  Blood pressure (!) 130/101, pulse 94, temperature 36.4 C (97.5 F), resp. rate 16, height 1.651 m ('5\' 5"'$ ), weight 121 kg (266 lb), SpO2 98%.

## 2022-04-29 ENCOUNTER — Telehealth (INDEPENDENT_AMBULATORY_CARE_PROVIDER_SITE_OTHER): Payer: Self-pay | Admitting: Surgery

## 2022-05-02 ENCOUNTER — Ambulatory Visit (HOSPITAL_COMMUNITY): Payer: Self-pay

## 2022-05-03 ENCOUNTER — Ambulatory Visit (HOSPITAL_COMMUNITY): Payer: Self-pay | Admitting: HEMATOLOGY-ONCOLOGY

## 2022-05-06 ENCOUNTER — Inpatient Hospital Stay (HOSPITAL_COMMUNITY): Payer: Self-pay

## 2022-05-07 ENCOUNTER — Other Ambulatory Visit (HOSPITAL_COMMUNITY): Payer: Self-pay | Admitting: HEMATOLOGY-ONCOLOGY

## 2022-05-07 ENCOUNTER — Telehealth (INDEPENDENT_AMBULATORY_CARE_PROVIDER_SITE_OTHER): Payer: Self-pay | Admitting: HEMATOLOGY-ONCOLOGY

## 2022-05-07 ENCOUNTER — Encounter (INDEPENDENT_AMBULATORY_CARE_PROVIDER_SITE_OTHER): Payer: Self-pay | Admitting: HEMATOLOGY-ONCOLOGY

## 2022-05-07 ENCOUNTER — Ambulatory Visit
Admission: RE | Admit: 2022-05-07 | Discharge: 2022-05-07 | Disposition: A | Discharge status: Home / Self Care | Payer: Commercial Managed Care - PPO | Source: Ambulatory Visit | Attending: HEMATOLOGY-ONCOLOGY | Admitting: HEMATOLOGY-ONCOLOGY

## 2022-05-07 ENCOUNTER — Other Ambulatory Visit: Payer: Self-pay

## 2022-05-07 DIAGNOSIS — Z171 Estrogen receptor negative status [ER-]: Secondary | ICD-10-CM | POA: Insufficient documentation

## 2022-05-07 DIAGNOSIS — C50411 Malignant neoplasm of upper-outer quadrant of right female breast: Secondary | ICD-10-CM

## 2022-05-07 MED ORDER — SODIUM CHLORIDE 0.9 % INJECTION SOLUTION
2.0000 mL | Freq: Once | INTRAVENOUS | Status: DC | PRN
Start: 2022-05-07 — End: 2022-05-08
  Administered 2022-05-07: 3 mL via INTRAVENOUS

## 2022-05-07 NOTE — Telephone Encounter (Signed)
Patient called facility and was informed of CT scan date and time. Patient stated her understanding.

## 2022-05-08 ENCOUNTER — Ambulatory Visit (HOSPITAL_COMMUNITY): Payer: Self-pay | Admitting: HEMATOLOGY-ONCOLOGY

## 2022-05-08 ENCOUNTER — Inpatient Hospital Stay (HOSPITAL_COMMUNITY): Payer: Self-pay

## 2022-05-09 ENCOUNTER — Ambulatory Visit (HOSPITAL_COMMUNITY): Payer: Self-pay

## 2022-05-10 ENCOUNTER — Other Ambulatory Visit (HOSPITAL_COMMUNITY): Payer: Self-pay | Admitting: HEMATOLOGY-ONCOLOGY

## 2022-05-10 ENCOUNTER — Encounter (HOSPITAL_COMMUNITY): Payer: Self-pay | Admitting: HEMATOLOGY-ONCOLOGY

## 2022-05-10 ENCOUNTER — Ambulatory Visit
Admission: RE | Admit: 2022-05-10 | Discharge: 2022-05-10 | Disposition: A | Discharge status: Home / Self Care | Payer: Commercial Managed Care - PPO | Source: Ambulatory Visit | Attending: HEMATOLOGY-ONCOLOGY | Admitting: HEMATOLOGY-ONCOLOGY

## 2022-05-10 ENCOUNTER — Ambulatory Visit (HOSPITAL_COMMUNITY)
Admission: RE | Admit: 2022-05-10 | Discharge: 2022-05-10 | Disposition: A | Discharge status: Home / Self Care | Payer: Commercial Managed Care - PPO | Source: Ambulatory Visit | Attending: HEMATOLOGY-ONCOLOGY | Admitting: HEMATOLOGY-ONCOLOGY

## 2022-05-10 ENCOUNTER — Other Ambulatory Visit: Payer: Self-pay

## 2022-05-10 ENCOUNTER — Ambulatory Visit: Payer: Commercial Managed Care - PPO | Admitting: HEMATOLOGY-ONCOLOGY

## 2022-05-10 VITALS — BP 132/94 | HR 106 | Temp 97.4°F | Ht 65.0 in | Wt 270.0 lb

## 2022-05-10 VITALS — BP 129/88 | HR 82 | Temp 97.0°F | Resp 18

## 2022-05-10 DIAGNOSIS — F17210 Nicotine dependence, cigarettes, uncomplicated: Secondary | ICD-10-CM | POA: Insufficient documentation

## 2022-05-10 DIAGNOSIS — Z171 Estrogen receptor negative status [ER-]: Secondary | ICD-10-CM | POA: Insufficient documentation

## 2022-05-10 DIAGNOSIS — C50411 Malignant neoplasm of upper-outer quadrant of right female breast: Secondary | ICD-10-CM

## 2022-05-10 DIAGNOSIS — Z5111 Encounter for antineoplastic chemotherapy: Secondary | ICD-10-CM | POA: Insufficient documentation

## 2022-05-10 DIAGNOSIS — Z803 Family history of malignant neoplasm of breast: Secondary | ICD-10-CM | POA: Insufficient documentation

## 2022-05-10 DIAGNOSIS — Z5112 Encounter for antineoplastic immunotherapy: Secondary | ICD-10-CM | POA: Insufficient documentation

## 2022-05-10 LAB — COMPREHENSIVE METABOLIC PANEL, NON-FASTING
ALBUMIN/GLOBULIN RATIO: 1.2 (ref 0.8–1.4)
ALBUMIN: 4.1 g/dL (ref 3.5–5.7)
ALKALINE PHOSPHATASE: 97 U/L (ref 34–104)
ALT (SGPT): 19 U/L (ref 7–52)
ANION GAP: 6 mmol/L (ref 4–13)
AST (SGOT): 16 U/L (ref 13–39)
BILIRUBIN TOTAL: 0.3 mg/dL (ref 0.3–1.2)
BUN/CREA RATIO: 18 (ref 6–22)
BUN: 16 mg/dL (ref 7–25)
CALCIUM, CORRECTED: 9 mg/dL (ref 8.9–10.8)
CALCIUM: 9.1 mg/dL (ref 8.6–10.3)
CHLORIDE: 102 mmol/L (ref 98–107)
CO2 TOTAL: 28 mmol/L (ref 21–31)
CREATININE: 0.87 mg/dL (ref 0.60–1.30)
ESTIMATED GFR: 83 mL/min/{1.73_m2} (ref >59)
GLOBULIN: 3.4 (ref 2.9–5.4)
GLUCOSE: 95 mg/dL (ref 74–109)
OSMOLALITY, CALCULATED: 273 mOsm/kg (ref 270–290)
POTASSIUM: 3.4 mmol/L — ABNORMAL LOW (ref 3.5–5.1)
PROTEIN TOTAL: 7.5 g/dL (ref 6.4–8.9)
SODIUM: 136 mmol/L (ref 136–145)

## 2022-05-10 LAB — MANUAL DIFFERENTIAL
BAND %: 1 % — ABNORMAL LOW (ref 5–11)
BANDS NEUTROPHILS MANUAL: 1
EOSINOPHIL %: 2 % (ref 0–7)
EOSINOPHIL ABSOLUTE: 0.27 10*3/uL (ref 0.00–0.80)
EOSINOPHILS MANUAL: 2
LYMPHOCYTE %: 20 % — ABNORMAL LOW (ref 25–45)
LYMPHOCYTE ABSOLUTE: 2.66 10*3/uL (ref 1.10–5.00)
LYMPHOCYTES MANUAL: 20
MONOCYTE %: 5 % (ref 0–12)
MONOCYTE ABSOLUTE: 0.67 10*3/uL (ref 0.00–1.30)
MONOCYTES MANUAL: 5
NEUTROPHIL %: 72 % (ref 40–76)
NEUTROPHIL ABSOLUTE: 9.71 10*3/uL — ABNORMAL HIGH (ref 1.80–8.40)
NEUTROPHILS MANUAL: 72
PLATELET MORPHOLOGY COMMENT: NORMAL
TOTAL CELLS COUNTED [#] IN BLOOD: 100
WBC: 13.3 10*3/uL

## 2022-05-10 LAB — CBC WITH DIFF
HCT: 40 % (ref 31.2–41.9)
HGB: 13.4 g/dL (ref 10.9–14.3)
MCH: 28.7 pg (ref 24.7–32.8)
MCHC: 33.5 g/dL (ref 32.3–35.6)
MCV: 85.7 fL (ref 75.5–95.3)
MPV: 8.1 fL (ref 7.9–10.8)
PLATELETS: 308 10*3/uL (ref 140–440)
RBC: 4.67 10*6/uL (ref 3.63–4.92)
RDW: 14 % (ref 12.3–17.7)
WBC: 13.3 10*3/uL — ABNORMAL HIGH (ref 3.8–11.8)

## 2022-05-10 LAB — MAGNESIUM: MAGNESIUM: 1.6 mg/dL — ABNORMAL LOW (ref 1.9–2.7)

## 2022-05-10 LAB — CORTISOL, PLASMA OR SERUM: CORTISOL: 8.3 ug/dL (ref 6.7–22.6)

## 2022-05-10 LAB — THYROID STIMULATING HORMONE WITH FREE T4 REFLEX: TSH: 1.834 u[IU]/mL (ref 0.450–5.330)

## 2022-05-10 MED ORDER — SODIUM CHLORIDE 0.9 % INTRAVENOUS SOLUTION
652.0000 mg | Freq: Once | INTRAVENOUS | Status: AC
Start: 2022-05-10 — End: 2022-05-10
  Administered 2022-05-10: 650 mg via INTRAVENOUS
  Administered 2022-05-10: 0 mg via INTRAVENOUS
  Filled 2022-05-10: qty 65

## 2022-05-10 MED ORDER — LORAZEPAM 0.5 MG TABLET
0.5000 mg | ORAL_TABLET | Freq: Four times a day (QID) | ORAL | Status: DC | PRN
Start: 2022-05-10 — End: 2022-05-11

## 2022-05-10 MED ORDER — ALBUTEROL SULFATE HFA 90 MCG/ACTUATION AEROSOL INHALER - RN
2.0000 | Freq: Once | RESPIRATORY_TRACT | Status: DC | PRN
Start: 2022-05-10 — End: 2022-05-11

## 2022-05-10 MED ORDER — SODIUM CHLORIDE 0.9 % INTRAVENOUS SOLUTION
12.0000 mg | Freq: Once | INTRAVENOUS | Status: DC
Start: 2022-05-10 — End: 2022-05-10

## 2022-05-10 MED ORDER — DEXTROSE 5% IN WATER (D5W) FLUSH BAG - 250 ML
INTRAVENOUS | Status: DC | PRN
Start: 2022-05-10 — End: 2022-05-11

## 2022-05-10 MED ORDER — MAGNESIUM SULFATE 1 GRAM/100 ML IN DEXTROSE 5 % INTRAVENOUS PIGGYBACK
INJECTION | INTRAVENOUS | Status: AC
Start: 2022-05-10 — End: 2022-05-10
  Filled 2022-05-10: qty 200

## 2022-05-10 MED ORDER — MEPERIDINE (PF) 25 MG/ML INJECTION SOLUTION
12.5000 mg | Freq: Once | INTRAMUSCULAR | Status: DC | PRN
Start: 2022-05-10 — End: 2022-05-11

## 2022-05-10 MED ORDER — HYDROCORTISONE SOD SUCCINATE 100 MG/2 ML VIAL WRAPPER
100.0000 mg | Freq: Once | INTRAMUSCULAR | Status: DC | PRN
Start: 2022-05-10 — End: 2022-05-11

## 2022-05-10 MED ORDER — SODIUM CHLORIDE 0.9 % INTRAVENOUS SOLUTION
200.0000 mg | Freq: Once | INTRAVENOUS | Status: AC
Start: 2022-05-10 — End: 2022-05-10
  Administered 2022-05-10: 0 mg via INTRAVENOUS
  Administered 2022-05-10: 200 mg via INTRAVENOUS
  Filled 2022-05-10: qty 8

## 2022-05-10 MED ORDER — SODIUM CHLORIDE 0.9% FLUSH BAG - 250 ML
INTRAVENOUS | Status: DC | PRN
Start: 2022-05-10 — End: 2022-05-11

## 2022-05-10 MED ORDER — DIPHENHYDRAMINE 50 MG/ML INJECTION SOLUTION
25.0000 mg | Freq: Once | INTRAMUSCULAR | Status: AC
Start: 2022-05-10 — End: 2022-05-10
  Administered 2022-05-10: 25 mg via INTRAVENOUS

## 2022-05-10 MED ORDER — PALONOSETRON 0.25 MG/5 ML INTRAVENOUS SOLUTION
INTRAVENOUS | Status: AC
Start: 2022-05-10 — End: 2022-05-10
  Filled 2022-05-10: qty 5

## 2022-05-10 MED ORDER — POTASSIUM CHLORIDE 20 MEQ/100ML IN STERILE WATER INTRAVENOUS PIGGYBACK
INJECTION | INTRAVENOUS | Status: AC
Start: 2022-05-10 — End: 2022-05-10
  Filled 2022-05-10: qty 100

## 2022-05-10 MED ORDER — PROCHLORPERAZINE EDISYLATE 10 MG/2 ML (5 MG/ML) INJECTION SOLUTION
10.0000 mg | Freq: Four times a day (QID) | INTRAMUSCULAR | Status: DC | PRN
Start: 2022-05-10 — End: 2022-05-11

## 2022-05-10 MED ORDER — OLANZAPINE 5 MG TABLET
ORAL_TABLET | ORAL | Status: AC
Start: 2022-05-10 — End: 2022-05-10
  Filled 2022-05-10: qty 1

## 2022-05-10 MED ORDER — POTASSIUM CHLORIDE 20 MEQ/100ML IN STERILE WATER INTRAVENOUS PIGGYBACK
20.0000 meq | INJECTION | Freq: Once | INTRAVENOUS | Status: AC
Start: 2022-05-10 — End: 2022-05-10
  Administered 2022-05-10: 0 meq via INTRAVENOUS
  Administered 2022-05-10: 20 meq via INTRAVENOUS

## 2022-05-10 MED ORDER — DIPHENHYDRAMINE 50 MG/ML INJECTION SOLUTION
25.0000 mg | Freq: Once | INTRAMUSCULAR | Status: DC | PRN
Start: 2022-05-10 — End: 2022-05-11

## 2022-05-10 MED ORDER — FAMOTIDINE (PF) 20 MG/2 ML INTRAVENOUS SOLUTION
20.0000 mg | Freq: Once | INTRAVENOUS | Status: AC
Start: 2022-05-10 — End: 2022-05-10
  Administered 2022-05-10: 20 mg via INTRAVENOUS

## 2022-05-10 MED ORDER — DIPHENHYDRAMINE 50 MG/ML INJECTION SOLUTION
50.0000 mg | Freq: Once | INTRAMUSCULAR | Status: DC | PRN
Start: 2022-05-10 — End: 2022-05-11

## 2022-05-10 MED ORDER — MAGNESIUM SULFATE 1 GRAM/100 ML IN DEXTROSE 5 % INTRAVENOUS PIGGYBACK
1.0000 g | INJECTION | INTRAVENOUS | Status: AC
Start: 2022-05-10 — End: 2022-05-10
  Administered 2022-05-10: 1 g via INTRAVENOUS
  Administered 2022-05-10: 0 g via INTRAVENOUS
  Administered 2022-05-10: 1 g via INTRAVENOUS
  Administered 2022-05-10: 0 g via INTRAVENOUS

## 2022-05-10 MED ORDER — DEXAMETHASONE SODIUM PHOSPHATE (PF) 10 MG/ML INJECTION SOLUTION
8.0000 mg | Freq: Once | INTRAMUSCULAR | Status: AC
Start: 2022-05-10 — End: 2022-05-10
  Administered 2022-05-10: 8 mg via INTRAVENOUS

## 2022-05-10 MED ORDER — SODIUM CHLORIDE 0.9 % INTRAVENOUS SOLUTION
150.0000 mg | Freq: Once | INTRAVENOUS | Status: AC
Start: 2022-05-10 — End: 2022-05-10
  Administered 2022-05-10: 0 mg via INTRAVENOUS
  Administered 2022-05-10: 150 mg via INTRAVENOUS
  Filled 2022-05-10: qty 5

## 2022-05-10 MED ORDER — PALONOSETRON 0.25 MG/5 ML INTRAVENOUS SOLUTION
0.2500 mg | Freq: Once | INTRAVENOUS | Status: AC
Start: 2022-05-10 — End: 2022-05-10
  Administered 2022-05-10: 0.25 mg via INTRAVENOUS

## 2022-05-10 MED ORDER — DIPHENHYDRAMINE 50 MG/ML INJECTION SOLUTION
INTRAMUSCULAR | Status: AC
Start: 2022-05-10 — End: 2022-05-10
  Filled 2022-05-10: qty 1

## 2022-05-10 MED ORDER — FAMOTIDINE (PF) 20 MG/2 ML INTRAVENOUS SOLUTION
INTRAVENOUS | Status: AC
Start: 2022-05-10 — End: 2022-05-10
  Filled 2022-05-10: qty 2

## 2022-05-10 MED ORDER — FAMOTIDINE (PF) 20 MG/2 ML INTRAVENOUS SOLUTION
20.0000 mg | Freq: Once | INTRAVENOUS | Status: DC | PRN
Start: 2022-05-10 — End: 2022-05-11

## 2022-05-10 MED ORDER — ALBUTEROL SULFATE 2.5 MG/3 ML (0.083 %) SOLUTION FOR NEBULIZATION
2.5000 mg | INHALATION_SOLUTION | Freq: Once | RESPIRATORY_TRACT | Status: DC | PRN
Start: 2022-05-10 — End: 2022-05-11

## 2022-05-10 MED ORDER — SODIUM CHLORIDE 0.9 % INTRAVENOUS SOLUTION
80.0000 mg/m2 | Freq: Once | INTRAVENOUS | Status: AC
Start: 2022-05-10 — End: 2022-05-10
  Administered 2022-05-10: 0 mg via INTRAVENOUS
  Administered 2022-05-10: 189 mg via INTRAVENOUS
  Filled 2022-05-10: qty 31.5

## 2022-05-10 MED ORDER — EPINEPHRINE HCL (PF) 1 MG/ML (1 ML) INJECTION SOLUTION
0.3000 mg | Freq: Once | INTRAMUSCULAR | Status: DC | PRN
Start: 2022-05-10 — End: 2022-05-11

## 2022-05-10 MED ORDER — OLANZAPINE 5 MG TABLET
5.0000 mg | ORAL_TABLET | Freq: Once | ORAL | Status: AC
Start: 2022-05-10 — End: 2022-05-10
  Administered 2022-05-10: 5 mg via ORAL

## 2022-05-10 MED ORDER — LORAZEPAM 2 MG/ML INJECTION WRAPPER
0.5000 mg | Freq: Four times a day (QID) | INTRAMUSCULAR | Status: DC | PRN
Start: 2022-05-10 — End: 2022-05-11

## 2022-05-10 MED ORDER — DEXAMETHASONE SODIUM PHOSPHATE (PF) 10 MG/ML INJECTION SOLUTION
INTRAMUSCULAR | Status: AC
Start: 2022-05-10 — End: 2022-05-10
  Filled 2022-05-10: qty 1

## 2022-05-10 MED ORDER — PROCHLORPERAZINE MALEATE 10 MG TABLET
10.0000 mg | ORAL_TABLET | Freq: Four times a day (QID) | ORAL | Status: DC | PRN
Start: 2022-05-10 — End: 2022-05-11

## 2022-05-10 NOTE — Progress Notes (Signed)
Department of Hematology/Oncology  History and Physical    Name: Rachel Mendoza  [id number]  Date of Birth: 03-Jan-1976  Encounter Date: 05/10/2022    REFERRING PROVIDER:  No referring provider defined for this encounter.    TELEMEDICINE DOCUMENTATION:  Patient Location:  [facility], [facility] outpatient Hematology/Oncology [address], [place] [address]  Patient/family aware of provider location:  yes  Patient/family consent for telemedicine:  yes    REASON FOR OFFICE VISIT:  New patient for evaluation and management of triple negative breast cancer.    HISTORY OF PRESENT ILLNESS:  Rachel Mendoza is a 47 y.o. female who presents today for initial medical oncology consultation regarding triple negative breast cancer.  The patient discovered a lump which was evaluated with mammogram, ultrasound, and biopsy.  The primary lesion measures in the range of 2.2-2.9 cm, and there were some nearby lymph nodes that were described as prominent although not obviously involved.  The narrow measurement for the lymph nodes was in the range of 1.1 cm.    The biopsy showed invasive ductal malignancy that was triple negative.  BRCA testing is currently pending.  Based on the above information, she was referred for consideration of neoadjuvant chemotherapy.    05/10/2022: The patient is here for follow up of triple negative breast cancer.  We still do not have the BRCA result.  We have also been trying to obtain a pretreatment PET-CT, but have not been able to obtain approval for it yet.    ROS:   Review of Systems   Constitutional:  Negative for appetite change, chills and fatigue.   HENT:   Negative for sore throat and trouble swallowing.    Eyes:  Negative for eye problems.   Respiratory:  Negative for cough and shortness of breath.    Cardiovascular:  Negative for chest pain and leg swelling.   Gastrointestinal:  Negative for abdominal pain.   Genitourinary:  Negative for dysuria and hematuria.     Musculoskeletal:  Negative for arthralgias and gait problem.   Skin:  Negative for rash.   Neurological:  Negative for gait problem.   Hematological:  Negative for adenopathy.   Psychiatric/Behavioral:  Negative for depression.         HISTORY:  Past Medical History:   Diagnosis Date    Allergic rhinitis     Anal fissure     Asthma     Bradycardia     Depression     Esophageal reflux     Hx of breast cancer     Hypersomnia     Hypertension     Hypothyroidism     Insomnia, unspecified     Kidney stones 06/06/2021    Nerve damage     Obesity, unspecified     Sleep apnea     Tachycardia, unspecified          Past Surgical History:   Procedure Laterality Date    HX BACK SURGERY      HX BREAST BIOPSY Right     HX HYSTERECTOMY      HX LITHOTRIPSY      HX TONSILLECTOMY      HX TUBAL LIGATION Bilateral          Social History     Socioeconomic History    Marital status: Married     Spouse name: Not on file    Number of children: Not on file    Years of education: Not on file  Highest education level: Not on file   Occupational History    Not on file   Tobacco Use    Smoking status: Every Day     Packs/day: 0.50     Years: 35.00     Additional pack years: 0.00     Total pack years: 17.50     Types: Cigarettes     Start date: 04/08/1986     Passive exposure: Never    Smokeless tobacco: Never   Vaping Use    Vaping Use: Never used   Substance and Sexual Activity    Alcohol use: Not Currently     Comment: occasionally    Drug use: Yes     Types: Marijuana     Comment: CBD Gummies    Sexual activity: Not on file   Other Topics Concern    Not on file   Social History Narrative    Not on file     Social Determinants of Health     Financial Resource Strain: Not on file   Transportation Needs: Not on file   Social Connections: Not on file   Intimate Partner Violence: Not on file   Housing Stability: Not on file     Family Medical History:       Problem Relation (Age of Onset)    Asthma Brother, Maternal Grandmother    Breast  Cancer Paternal Grandmother    Diabetes type II Father, Paternal Grandmother    Elevated Lipids Father    Heart Disease Father    Hypertension (High Blood Pressure) Mother, Father    No Known Problems Sister, Maternal Aunt, Maternal Uncle, Paternal 30, Paternal Uncle, Maternal Grandfather, Paternal Grandfather, Daughter, Son, Other            Current Outpatient Medications   Medication Sig    albuterol sulfate (PROVENTIL OR VENTOLIN OR PROAIR) 90 mcg/actuation Inhalation oral inhaler Take 1-2 Puffs by inhalation Every 6 hours as needed    budesonide (PULMICORT INHL) Take 1 Puff by inhalation Twice per day as needed    dilTIAZem (CARDIZEM CD) 120 mg Oral Capsule, Sust. Release 24 hr Take 1 Capsule (120 mg total) by mouth Once a day    gabapentin (NEURONTIN) 300 mg Oral Capsule Take 1 Capsule (300 mg total) by mouth Four times a day    hydroCHLOROthiazide (HYDRODIURIL) 25 mg Oral Tablet Take 1 Tablet (25 mg total) by mouth Once a day    levothyroxine (SYNTHROID) 125 mcg Oral Tablet Take 1 Tablet (125 mcg total) by mouth Once a day    loratadine (CLARITIN) 10 mg Oral Tablet Take 1 Tablet (10 mg total) by mouth Once a day    losartan (COZAAR) 50 mg Oral Tablet Take 1 Tablet (50 mg total) by mouth Once a day    melatonin 5 mg Oral Tablet, Chewable Chew 2 Tablets (10 mg total) Every night    montelukast (SINGULAIR) 10 mg Oral Tablet Take 1 Tablet (10 mg total) by mouth Once a day    omeprazole (PRILOSEC) 20 mg Oral Capsule, Delayed Release(E.C.) Take 1 Capsule (20 mg total) by mouth Once a day    ondansetron (ZOFRAN) 8 mg Oral Tablet Take 1 Tablet (8 mg total) by mouth Every 8 hours as needed for Nausea/Vomiting    sertraline (ZOLOFT) 100 mg Oral Tablet Take 0.5 Tablets (50 mg total) by mouth Once a day     Allergies   Allergen Reactions    Dexamethasone (Pf) Swelling    Tamsulosin Nausea/  Vomiting       PHYSICAL EXAM:  Most Recent Vitals    Flowsheet Row Telemedicine from 04/24/2022 in Hematology/Oncology,   [facility]   Temperature 36.6 C (97.8 F) filed at... 04/24/2022 1338   Heart Rate 104 filed at... 04/24/2022 1338   Respiratory Rate --   BP (Non-Invasive) 147/99 filed at... 04/24/2022 1338   SpO2 96 % filed at... 04/24/2022 1338   Height 1.651 m (5' 5"$ ) filed at... 04/24/2022 1338   Weight 121 kg (266 lb 6.4 oz) filed at... 04/24/2022 1338   BMI (Calculated) 44.42 filed at... 04/24/2022 1338   BSA (Calculated) 2.35 filed at... 04/24/2022 1338      ECOG Status: (0) Fully active, able to carry on all predisease performance without restriction   Physical Exam    DIAGNOSTIC DATA:  No results found for this or any previous visit (from the past 17520 hour(s)).    LABS:   CBC  Diff   Lab Results   Component Value Date/Time    WBC 13.3 (H) 05/10/2022 07:54 AM    WBC 13.3 05/10/2022 07:54 AM    HGB 13.4 05/10/2022 07:54 AM    HCT 40.0 05/10/2022 07:54 AM    PLTCNT 308 05/10/2022 07:54 AM    RBC 4.67 05/10/2022 07:54 AM    MCV 85.7 05/10/2022 07:54 AM    MCHC 33.5 05/10/2022 07:54 AM    MCH 28.7 05/10/2022 07:54 AM    RDW 14.0 05/10/2022 07:54 AM    MPV 8.1 05/10/2022 07:54 AM    Lab Results   Component Value Date/Time    PMNS 72 05/10/2022 07:54 AM    LYMPHOCYTES 20 (L) 05/10/2022 07:54 AM    EOSINOPHIL 2 05/10/2022 07:54 AM    MONOCYTES 5 05/10/2022 07:54 AM    BASOPHILS 1 04/26/2022 06:22 AM    BASOPHILS 0.10 04/26/2022 06:22 AM    PMNABS 6.60 04/26/2022 06:22 AM    LYMPHSABS 3.00 04/26/2022 06:22 AM    EOSABS 0.30 04/26/2022 06:22 AM    MONOSABS 0.60 04/26/2022 06:22 AM            ASSESSMENT:    ICD-10-CM    1. Malignant neoplasm of upper-outer quadrant of right breast in female, estrogen receptor negative (CMS [place])   C50.411     Z17.1            PLAN:   1. All relevant medical records were reviewed including available pertinent provider notes, procedure notes, imaging, laboratory, and pathology.   2. All pertinent labs and/or imaging were reviewed with the patient.   3. Triple negative breast cancer:  We  previously reviewed the staging information with the patient, and reviewed the NCCN guidelines.  She is either T2 N0 M0 or T2 N1 M0.  Either one of those would be categorized as stage II.  According to NCCN guidelines, this would fall into the high-risk triple negative category and warrant consideration of the regimen of paclitaxel carboplatin pembrolizumab followed by doxorubicin cyclophosphamide pembrolizumab, followed by ongoing pembrolizumab.  BRCA mutation status is pending and could potentially result in the addition of a PARP inhibitor.  She is ready to move forward with treatment so we will proceed with cycle 1. Today.    Rachel Mendoza was given the chance to ask questions, and these were answered to their satisfaction. The patient is welcome to call with any questions or concerns in the meantime.     On the day of the encounter, a  total of 35 minutes was spent on this patient encounter including review of historical information, examination, documentation and post-visit activities.   Return in about 3 weeks (around 05/31/2022).     Narda Rutherford, MD  05/10/2022, 08:40  The patient was seen as part of a collaborative telemedicine service with Dr. Jake Shark who participated in the encounter by active presence via approved video/audio means for portions of the encounter.  The patient's insurance company bears full legal and financial responsibility resulting from any deviations that they cause to my recommended treatment plan.   CC:  Selena Lesser, FNP-BC  [place] [phone]    No referring provider defined for this encounter.    This note was partially generated using MModal Fluency Direct system, and there may be some incorrect words, spellings, and punctuation that were not noted in checking the note before saving.

## 2022-05-10 NOTE — Nurses Notes (Addendum)
[id number]- Arrived to unit ambulatory with husband for labs then to see Doctor. Here for first treatment. Labs collect via venipuncture. Starleen Blue, RN   [phone]- Back to unit for treatment. Process explained to patient and husband. All questions answered. Printed material given regarding new medications, side effects reviewed. Physical assessment WNL, lungs clear, no edema. Has a history of nerve damage to back with nerve pain down right leg. VSS. Weight obtained at doctor. All assessments complete. Starleen Blue, RN   [phone]- Right chest Portacath accessed, blood return noted. Portacath site clear, no signs of injection noted. Incision healed well. Explained process to patient and husband. Starleen Blue, RN   [phone]- Dr Jake Shark notified of patient's listed allergy to Dexamethasone. States she took a cycle of PO Dexamethasone that caused generalized swelling and increased fluid after 4 doses of medication. Has never received IV form. Dr Jake Shark decreased dose to '8mg'$  IV, patient agreeable to try medication. Also notified Dr Jake Shark of K 3.4, Mg 1.6. Replacement ordered. Starleen Blue, RN   [phone] - Pepcid IVP, Aloxi IVP, Benadryl IVP, Aloxi IVP, Zyprexa PO given. Starleen Blue, RN   [phone]- Dexamethasone IVP given. Starleen Blue, RN   [phone]- Emend infusion started. Starleen Blue, RN   1010- Emend infusion complete, line flushing, tolerated well. Starleen Blue, RN   [phone]- Patient complaining of chest tightness. VSS. No other complaints voiced. Encouraged patient to let nursing staff know of any other new symptoms. Starleen Blue, RN   [phone]- Chest tightness improved. "Feeling better now." Keytruda infusion started. Starleen Blue, RN   [phone]- Keytruda infusion complete, line flushing, tolerated well. Starleen Blue, RN   [phone]- Paclitaxel infusion started. Starleen Blue, RN   [phone]- Magnesium 1st gm and Potassium 81mq started. VStarleen Blue RN   [phone] Magnesium 2nd gm started. [place] states she feels hot, sweaty. BP and HR WNL. No other complaints voiced. Air turned on with improvement noted. VStarleen Blue RN   [phone] Magnesium complete, line flushing. VStarleen Blue RN   [phone] Paclitaxel infusion complete, line flushing, tolerated well. VStarleen Blue RN   [phone] Potassium complete, line flushing. VStarleen Blue RN   [phone] Carboplatin infusion started. VStarleen Blue RN   [phone] Carboplatin infusion complete, line flushing, tolerated well. VStarleen Blue RN   1520- VSS. Right chest Portacath flushed, deaccessed, pressure dressing applied. No complaints voiced. All questions answered. VStarleen Blue RN   [phone] Patient and husband left unit ambulatory with belongings. VStarleen Blue RN

## 2022-05-11 ENCOUNTER — Encounter (HOSPITAL_COMMUNITY): Payer: Self-pay | Admitting: HEMATOLOGY-ONCOLOGY

## 2022-05-13 ENCOUNTER — Telehealth (HOSPITAL_COMMUNITY): Payer: Self-pay | Admitting: HEMATOLOGY-ONCOLOGY

## 2022-05-13 NOTE — Telephone Encounter (Signed)
Patient notified of authorization of CT scan.

## 2022-05-16 ENCOUNTER — Encounter (HOSPITAL_COMMUNITY): Payer: Self-pay | Admitting: HEMATOLOGY-ONCOLOGY

## 2022-05-16 ENCOUNTER — Ambulatory Visit (HOSPITAL_COMMUNITY): Payer: Self-pay

## 2022-05-17 ENCOUNTER — Encounter (HOSPITAL_COMMUNITY): Payer: Self-pay | Admitting: NURSE PRACTITIONER

## 2022-05-17 ENCOUNTER — Ambulatory Visit
Admission: RE | Admit: 2022-05-17 | Discharge: 2022-05-17 | Disposition: A | Discharge status: Home / Self Care | Payer: Commercial Managed Care - PPO | Source: Ambulatory Visit | Attending: HEMATOLOGY-ONCOLOGY | Admitting: HEMATOLOGY-ONCOLOGY

## 2022-05-17 ENCOUNTER — Other Ambulatory Visit (HOSPITAL_COMMUNITY): Payer: Self-pay | Admitting: NURSE PRACTITIONER

## 2022-05-17 ENCOUNTER — Other Ambulatory Visit: Payer: Self-pay

## 2022-05-17 ENCOUNTER — Encounter (HOSPITAL_COMMUNITY): Payer: Self-pay | Admitting: HEMATOLOGY-ONCOLOGY

## 2022-05-17 ENCOUNTER — Encounter (HOSPITAL_COMMUNITY): Payer: Self-pay

## 2022-05-17 VITALS — BP 114/69 | HR 90 | Temp 98.7°F | Resp 16 | Ht 65.0 in | Wt 271.7 lb

## 2022-05-17 DIAGNOSIS — Z5111 Encounter for antineoplastic chemotherapy: Secondary | ICD-10-CM | POA: Insufficient documentation

## 2022-05-17 DIAGNOSIS — Z171 Estrogen receptor negative status [ER-]: Secondary | ICD-10-CM | POA: Insufficient documentation

## 2022-05-17 DIAGNOSIS — C50411 Malignant neoplasm of upper-outer quadrant of right female breast: Secondary | ICD-10-CM | POA: Insufficient documentation

## 2022-05-17 LAB — CBC WITH DIFF
BASOPHIL #: 0.1 10*3/uL (ref 0.00–0.10)
BASOPHIL %: 1 % (ref 0–1)
EOSINOPHIL #: 0.3 10*3/uL (ref 0.00–0.50)
EOSINOPHIL %: 3 %
HCT: 35 % (ref 31.2–41.9)
HGB: 12.1 g/dL (ref 10.9–14.3)
LYMPHOCYTE #: 1.6 10*3/uL (ref 1.00–3.00)
LYMPHOCYTE %: 19 % (ref 16–44)
MCH: 29.6 pg (ref 24.7–32.8)
MCHC: 34.5 g/dL (ref 32.3–35.6)
MCV: 85.7 fL (ref 75.5–95.3)
MONOCYTE #: 0.2 10*3/uL — ABNORMAL LOW (ref 0.30–1.00)
MONOCYTE %: 3 % — ABNORMAL LOW (ref 5–13)
MPV: 8.3 fL (ref 7.9–10.8)
NEUTROPHIL #: 6.4 10*3/uL (ref 1.85–7.80)
NEUTROPHIL %: 74 % (ref 43–77)
PLATELETS: 273 10*3/uL (ref 140–440)
RBC: 4.09 10*6/uL (ref 3.63–4.92)
RDW: 13.5 % (ref 12.3–17.7)
WBC: 8.6 10*3/uL (ref 3.8–11.8)

## 2022-05-17 MED ORDER — HYDROCORTISONE SOD SUCCINATE 100 MG/2 ML VIAL WRAPPER
100.0000 mg | Freq: Once | INTRAMUSCULAR | Status: DC | PRN
Start: 2022-05-17 — End: 2022-05-18

## 2022-05-17 MED ORDER — FAMOTIDINE (PF) 20 MG/2 ML INTRAVENOUS SOLUTION
INTRAVENOUS | Status: AC
Start: 2022-05-17 — End: 2022-05-17
  Filled 2022-05-17: qty 2

## 2022-05-17 MED ORDER — FAMOTIDINE (PF) 20 MG/2 ML INTRAVENOUS SOLUTION
20.0000 mg | Freq: Once | INTRAVENOUS | Status: AC
Start: 2022-05-17 — End: 2022-05-17
  Administered 2022-05-17: 20 mg via INTRAVENOUS

## 2022-05-17 MED ORDER — ALBUTEROL SULFATE HFA 90 MCG/ACTUATION AEROSOL INHALER - RN
2.0000 | Freq: Once | RESPIRATORY_TRACT | Status: DC | PRN
Start: 2022-05-17 — End: 2022-05-18

## 2022-05-17 MED ORDER — MEPERIDINE (PF) 25 MG/ML INJECTION SOLUTION
12.5000 mg | Freq: Once | INTRAMUSCULAR | Status: DC | PRN
Start: 2022-05-17 — End: 2022-05-18

## 2022-05-17 MED ORDER — DIPHENHYDRAMINE 50 MG/ML INJECTION SOLUTION
25.0000 mg | Freq: Once | INTRAMUSCULAR | Status: AC
Start: 2022-05-17 — End: 2022-05-17
  Administered 2022-05-17: 25 mg via INTRAVENOUS

## 2022-05-17 MED ORDER — PROCHLORPERAZINE EDISYLATE 10 MG/2 ML (5 MG/ML) INJECTION SOLUTION
INTRAMUSCULAR | Status: AC
Start: 2022-05-17 — End: 2022-05-17
  Filled 2022-05-17: qty 2

## 2022-05-17 MED ORDER — ALBUTEROL SULFATE 2.5 MG/3 ML (0.083 %) SOLUTION FOR NEBULIZATION
2.5000 mg | INHALATION_SOLUTION | Freq: Once | RESPIRATORY_TRACT | Status: DC | PRN
Start: 2022-05-17 — End: 2022-05-18

## 2022-05-17 MED ORDER — DIPHENHYDRAMINE 50 MG/ML INJECTION SOLUTION
25.0000 mg | Freq: Once | INTRAMUSCULAR | Status: DC | PRN
Start: 2022-05-17 — End: 2022-05-18

## 2022-05-17 MED ORDER — MAGIC MOUTHWASH
10.0000 mL | ORAL | 0 refills | Status: DC | PRN
Start: 2022-05-17 — End: 2022-06-28

## 2022-05-17 MED ORDER — DIPHENHYDRAMINE 50 MG/ML INJECTION SOLUTION
50.0000 mg | Freq: Once | INTRAMUSCULAR | Status: DC | PRN
Start: 2022-05-17 — End: 2022-05-18

## 2022-05-17 MED ORDER — FAMOTIDINE (PF) 20 MG/2 ML INTRAVENOUS SOLUTION
20.0000 mg | Freq: Once | INTRAVENOUS | Status: DC | PRN
Start: 2022-05-17 — End: 2022-05-18
  Administered 2022-05-17: 0 mg via INTRAVENOUS

## 2022-05-17 MED ORDER — PROCHLORPERAZINE MALEATE 10 MG TABLET
10.0000 mg | ORAL_TABLET | Freq: Four times a day (QID) | ORAL | 2 refills | Status: DC | PRN
Start: 2022-05-17 — End: 2022-10-25

## 2022-05-17 MED ORDER — OLANZAPINE 5 MG TABLET
5.0000 mg | ORAL_TABLET | Freq: Once | ORAL | Status: AC
Start: 2022-05-17 — End: 2022-05-17
  Administered 2022-05-17: 5 mg via ORAL

## 2022-05-17 MED ORDER — PROCHLORPERAZINE EDISYLATE 10 MG/2 ML (5 MG/ML) INJECTION SOLUTION
10.0000 mg | Freq: Once | INTRAMUSCULAR | Status: AC
Start: 2022-05-17 — End: 2022-05-17
  Administered 2022-05-17: 10 mg via INTRAVENOUS

## 2022-05-17 MED ORDER — DIPHENHYDRAMINE 50 MG/ML INJECTION SOLUTION
INTRAMUSCULAR | Status: AC
Start: 2022-05-17 — End: 2022-05-17
  Filled 2022-05-17: qty 1

## 2022-05-17 MED ORDER — EPINEPHRINE HCL (PF) 1 MG/ML (1 ML) INJECTION SOLUTION
0.3000 mg | Freq: Once | INTRAMUSCULAR | Status: DC | PRN
Start: 2022-05-17 — End: 2022-05-18

## 2022-05-17 MED ORDER — SODIUM CHLORIDE 0.9 % INTRAVENOUS SOLUTION
80.0000 mg/m2 | Freq: Once | INTRAVENOUS | Status: AC
Start: 2022-05-17 — End: 2022-05-17
  Administered 2022-05-17: 189 mg via INTRAVENOUS
  Administered 2022-05-17: 0 mg via INTRAVENOUS
  Filled 2022-05-17: qty 31.5

## 2022-05-17 MED ORDER — OLANZAPINE 5 MG TABLET
ORAL_TABLET | ORAL | Status: AC
Start: 2022-05-17 — End: 2022-05-17
  Filled 2022-05-17: qty 1

## 2022-05-17 NOTE — Nurses Notes (Addendum)
0755-Arrived to OP ONC ambulatory and accompanied by spouse.  Here for Taxol infusion.  To room 414. Judithann Sauger, RN  [phone] completed.  Lying in bed with HOB elevated.  Spouse seated at bedside.  Awake, alert, and oriented x 3.  Stated she felt ok after last treatment until Sunday.  Per pt, she had stomach cramps with constipation which lasted a day or so, and then she had stomach cramps with diarrhea which just "eased up yesterday."  Has had one loose still this a.m.  Has also had a history of neuropathy but worse in hands and feet after last treatment.  Currently, takes Neurontin 300 mg PO four times a day.  Also verbalized c/o of bone pain after last treatment, especially when lying down.    No, current bone pain.    Per pt, she did have some generalized swelling, following last treatment, but more so around face and neck.  Pt believes the Dexamethasone was the cause of swelling.  Swelling lasted from Sunday (05/12/22) until Monday (05/13/22).  Also stated, "Nausea has been horrible."  Reviewed how to take Zofran PO at home and requested addition of Compazine from Tilda Burrow, RN.   Currently, verbalizes c/o of pain in right jaw rated 2/10 and described as sore, achy, and tender.  Per pt, she has had dental issues in the past.  Pain is tolerable at present.  Also verbalized c/o tenderness in right breast rated 3/10, which is not new.  Pain in breast no better but no worse, per pt.   HRR.  Lungs clear in all fields and bases.  Abdomen soft and non-tender with active bowel sounds in all four quadrants.  Trace edema noted to bilateral ankles.  No s/s of acute distress noted.  VSS.  Judithann Sauger, RN  [phone] requests to see Tilda Burrow, RN.  Was told to ask for her if she had any questions or concerns.  Per pt, she has "a lot to talk to her about."  Secure message sent to Tilda Burrow, RN to come speak with pt when time permits. Judithann Sauger, RN  0915-Heather Jonelle Sports, RN in room.  Reviewing pt concerns and  taking notes.  Judithann Sauger, RN  0928-Zyprexa 29m PO administered.  Pt refused Dexamethasone 854mIV due to swelling after last infusion. AmJudithann SaugerRN  0929-Pepcid 20 mg IVP administered per premedication order.  AmJudithann SaugerRN   0931-Benadryl 25 mg IVP administered per premedication order. AmJudithann SaugerRN  0934-Compazine 10 mg IVP administered per order for nausea at this time. AmJudithann SaugerRN   0958-Taxol infusion started.  Resting quietly with no voiced complaints offered. AmJudithann SaugerRN  1058-Taxol infusion complete. Tolerated well.  Per pt, she is sleepy but feels ok.  Spouse remains at bedside and with transport pt home in personal vehicle. AmJudithann SaugerRN  1139-New prescription for Compazine 10 mg po four times a day as needed for N/V noted in home medications.  Verbal instructions given on alternating Zofran and Compazine at home for the next 3-4 day and then take as prescribed on bottle.  Pt verbalized understanding.  Left OP NC ambulatory and accompanied by spouse .  No s/s of acute distress.  VSS.  AmJudithann SaugerRN

## 2022-05-17 NOTE — Progress Notes (Signed)
One wig given to patient.

## 2022-05-17 NOTE — Progress Notes (Signed)
I spoke with patient today during infusion regarding treatment plan (medications and schedule) and side effects from medications. Pathology report and echo printed and given to patient per her request. She states Dr Jake Shark has already reviewed the pathology report with her. Patient states she has concerns regarding the "trivial pericardial effusion" noted on the echo report and has discussed with Dr Jake Shark. She sees Dr Marcille Blanco cardiologist in [place] previously and is going to call today for follow up appointment. She states she has been in contact with Dr Venia Minks office for gene testing results sent on 12/18. Patient states she was not able to go to the [facility] this week due to feeling unwell and asked if there is an option to file this request online. Secure chat message sent to [facility]. Information regarding grants given to patient from previous discussion with Angela Nevin on 1/18 "Right now there are no grants for breast or metastatic breast cancer, I always check both funds but neither are open. I can do a copay card for the [place] because that is pretty expensive and looks like he plans to do that one medicine ongoing for a while even after chemo cycles done so that is one option to help some, can do the [employer] applilcation for her also..." Patient states she has "thrush" from previous use of inhalers and requested medication for that. I discussed with her that her treatment may also cause mouth sores. After discussing with Kathrin Penner FNP prescription for Magic Mouth Wash pended for signature. I spoke with [place] and they do have Lidocaine available for prescription. Patient also states that she had nausea on day 2-4 after last treatment. Prescription for Compazine pended for Kathrin Penner FNP. Amanda, infusion nurse, and I discussed with patient that she can alternate Zofran and Compazine every 4 hours to help with nausea, patient verbalized understanding. Patient states that day 2-3 after last  treatment she developed swelling to the left wrist and ankles, she felt this was due to the steroid given prior to treatment. Patient refused steroid prior to treatment today. Gave patient my contact phone number and encouraged patient to call with any further questions or concerns.

## 2022-05-24 ENCOUNTER — Encounter (HOSPITAL_COMMUNITY): Payer: Self-pay

## 2022-05-24 ENCOUNTER — Other Ambulatory Visit: Payer: Self-pay

## 2022-05-24 ENCOUNTER — Ambulatory Visit
Admission: RE | Admit: 2022-05-24 | Discharge: 2022-05-24 | Disposition: A | Discharge status: Home / Self Care | Payer: Commercial Managed Care - PPO | Source: Ambulatory Visit | Attending: HEMATOLOGY-ONCOLOGY | Admitting: HEMATOLOGY-ONCOLOGY

## 2022-05-24 VITALS — BP 156/97 | HR 99 | Temp 96.3°F | Resp 18 | Ht 65.0 in | Wt 276.5 lb

## 2022-05-24 DIAGNOSIS — Z5111 Encounter for antineoplastic chemotherapy: Secondary | ICD-10-CM | POA: Insufficient documentation

## 2022-05-24 DIAGNOSIS — C50411 Malignant neoplasm of upper-outer quadrant of right female breast: Secondary | ICD-10-CM | POA: Insufficient documentation

## 2022-05-24 DIAGNOSIS — Z171 Estrogen receptor negative status [ER-]: Secondary | ICD-10-CM | POA: Insufficient documentation

## 2022-05-24 LAB — CBC WITH DIFF
BASOPHIL #: 0 10*3/uL (ref 0.00–0.10)
BASOPHIL %: 1 % (ref 0–1)
EOSINOPHIL #: 0.1 10*3/uL (ref 0.00–0.50)
EOSINOPHIL %: 2 %
HCT: 34.9 % (ref 31.2–41.9)
HGB: 11.7 g/dL (ref 10.9–14.3)
LYMPHOCYTE #: 2.5 10*3/uL (ref 1.00–3.00)
LYMPHOCYTE %: 50 % — ABNORMAL HIGH (ref 16–44)
MCH: 29 pg (ref 24.7–32.8)
MCHC: 33.5 g/dL (ref 32.3–35.6)
MCV: 86.6 fL (ref 75.5–95.3)
MONOCYTE #: 0.3 10*3/uL (ref 0.30–1.00)
MONOCYTE %: 6 % (ref 5–13)
MPV: 7.7 fL — ABNORMAL LOW (ref 7.9–10.8)
NEUTROPHIL #: 2.1 10*3/uL (ref 1.85–7.80)
NEUTROPHIL %: 42 % — ABNORMAL LOW (ref 43–77)
PLATELETS: 305 10*3/uL (ref 140–440)
RBC: 4.02 10*6/uL (ref 3.63–4.92)
RDW: 13.4 % (ref 12.3–17.7)
WBC: 4.9 10*3/uL (ref 3.8–11.8)

## 2022-05-24 MED ORDER — DIPHENHYDRAMINE 50 MG/ML INJECTION SOLUTION
INTRAMUSCULAR | Status: AC
Start: 2022-05-24 — End: 2022-05-24
  Filled 2022-05-24: qty 1

## 2022-05-24 MED ORDER — EPINEPHRINE HCL (PF) 1 MG/ML (1 ML) INJECTION SOLUTION
0.3000 mg | Freq: Once | INTRAMUSCULAR | Status: DC | PRN
Start: 2022-05-24 — End: 2022-05-25

## 2022-05-24 MED ORDER — DIPHENHYDRAMINE 50 MG/ML INJECTION SOLUTION
50.0000 mg | Freq: Once | INTRAMUSCULAR | Status: DC | PRN
Start: 2022-05-24 — End: 2022-05-25

## 2022-05-24 MED ORDER — ALBUTEROL SULFATE HFA 90 MCG/ACTUATION AEROSOL INHALER - RN
2.0000 | Freq: Once | RESPIRATORY_TRACT | Status: DC | PRN
Start: 2022-05-24 — End: 2022-05-25

## 2022-05-24 MED ORDER — DIPHENHYDRAMINE 50 MG/ML INJECTION SOLUTION
25.0000 mg | Freq: Once | INTRAMUSCULAR | Status: AC
Start: 2022-05-24 — End: 2022-05-24
  Administered 2022-05-24: 25 mg via INTRAVENOUS

## 2022-05-24 MED ORDER — ALBUTEROL SULFATE 2.5 MG/3 ML (0.083 %) SOLUTION FOR NEBULIZATION
2.5000 mg | INHALATION_SOLUTION | Freq: Once | RESPIRATORY_TRACT | Status: DC | PRN
Start: 2022-05-24 — End: 2022-05-25

## 2022-05-24 MED ORDER — DIPHENHYDRAMINE 50 MG/ML INJECTION SOLUTION
25.0000 mg | Freq: Once | INTRAMUSCULAR | Status: DC | PRN
Start: 2022-05-24 — End: 2022-05-25

## 2022-05-24 MED ORDER — FAMOTIDINE (PF) 20 MG/2 ML INTRAVENOUS SOLUTION
20.0000 mg | Freq: Once | INTRAVENOUS | Status: AC
Start: 2022-05-24 — End: 2022-05-24
  Administered 2022-05-24: 20 mg via INTRAVENOUS

## 2022-05-24 MED ORDER — HYDROCORTISONE SOD SUCCINATE 100 MG/2 ML VIAL WRAPPER
100.0000 mg | Freq: Once | INTRAMUSCULAR | Status: DC | PRN
Start: 2022-05-24 — End: 2022-05-25

## 2022-05-24 MED ORDER — SODIUM CHLORIDE 0.9 % INTRAVENOUS SOLUTION
80.0000 mg/m2 | Freq: Once | INTRAVENOUS | Status: AC
Start: 2022-05-24 — End: 2022-05-24
  Administered 2022-05-24: 0 mg via INTRAVENOUS
  Administered 2022-05-24: 189 mg via INTRAVENOUS
  Filled 2022-05-24: qty 31.5

## 2022-05-24 MED ORDER — FAMOTIDINE (PF) 20 MG/2 ML INTRAVENOUS SOLUTION
INTRAVENOUS | Status: AC
Start: 2022-05-24 — End: 2022-05-24
  Filled 2022-05-24: qty 2

## 2022-05-24 MED ORDER — MEPERIDINE (PF) 25 MG/ML INJECTION SOLUTION
12.5000 mg | Freq: Once | INTRAMUSCULAR | Status: DC | PRN
Start: 2022-05-24 — End: 2022-05-25

## 2022-05-24 MED ORDER — FAMOTIDINE (PF) 20 MG/2 ML INTRAVENOUS SOLUTION
20.0000 mg | Freq: Once | INTRAVENOUS | Status: DC | PRN
Start: 2022-05-24 — End: 2022-05-25

## 2022-05-24 NOTE — Nurses Notes (Signed)
1055 - Patient ambulatory to room for treatment today. Assessment complete. Lungs clear throughout lung fields. Abdomen soft and non-tender, bowel sounds present. Generalized edema noted. Patient denies any pain or discomfort. [facility], RN  Patient Assessment/Symptom Management Patient Has No MD Appointment Today   Key: (+) Symptom present           (-)  Symptom not present If Symptom is Positive(+) a Nursing Note is required   Edema -   Uncontrolled Nausea -   Vomiting -   Inability to eat/drink -   Mouth Sores -   Diarrhea -   Constipation (? Last BM) -   Fatigue that interferes with ADL's -   Numbness/Tingling -change -   Other -   Fever/Signs & Symptoms of infection -   Nurse Initials JD     1105 - PAC accessed at this time and labs drawn from port. She[facility], RN  [phone] - Labs reviewed and are good for treatment. Orders released to pharmacy. She[facility], RN  1200 [phone] - Premedications Pepcid and Benadryl given. [facility], RN  [phone] - Taxol infusion started. She[facility], RN  [phone] - Taxol infusion complete. Line flushing. She[facility], RN  1355 - PAC flushed with 40m NS and deaccessed at this time. Gauze dressing and adhesive bandage applied. JShepard General RN  [phone]- Patient left via ambulation with husband. JShepard General RN

## 2022-05-28 ENCOUNTER — Encounter (HOSPITAL_COMMUNITY): Payer: Self-pay | Admitting: HEMATOLOGY-ONCOLOGY

## 2022-05-28 ENCOUNTER — Other Ambulatory Visit: Payer: Self-pay

## 2022-05-28 ENCOUNTER — Encounter (RURAL_HEALTH_CENTER): Payer: Self-pay | Admitting: Family

## 2022-05-28 ENCOUNTER — Ambulatory Visit (RURAL_HEALTH_CENTER): Payer: Commercial Managed Care - PPO | Attending: Family | Admitting: Family

## 2022-05-28 DIAGNOSIS — Z1159 Encounter for screening for other viral diseases: Secondary | ICD-10-CM | POA: Insufficient documentation

## 2022-05-28 DIAGNOSIS — Z8659 Personal history of other mental and behavioral disorders: Secondary | ICD-10-CM

## 2022-05-28 DIAGNOSIS — M79645 Pain in left finger(s): Secondary | ICD-10-CM | POA: Insufficient documentation

## 2022-05-28 DIAGNOSIS — K219 Gastro-esophageal reflux disease without esophagitis: Secondary | ICD-10-CM | POA: Insufficient documentation

## 2022-05-28 DIAGNOSIS — C50411 Malignant neoplasm of upper-outer quadrant of right female breast: Secondary | ICD-10-CM | POA: Insufficient documentation

## 2022-05-28 DIAGNOSIS — J4540 Moderate persistent asthma, uncomplicated: Secondary | ICD-10-CM

## 2022-05-28 DIAGNOSIS — E669 Obesity, unspecified: Secondary | ICD-10-CM | POA: Insufficient documentation

## 2022-05-28 DIAGNOSIS — N200 Calculus of kidney: Secondary | ICD-10-CM | POA: Insufficient documentation

## 2022-05-28 DIAGNOSIS — Z114 Encounter for screening for human immunodeficiency virus [HIV]: Secondary | ICD-10-CM

## 2022-05-28 DIAGNOSIS — G4710 Hypersomnia, unspecified: Secondary | ICD-10-CM | POA: Insufficient documentation

## 2022-05-28 DIAGNOSIS — Z6841 Body Mass Index (BMI) 40.0 and over, adult: Secondary | ICD-10-CM | POA: Insufficient documentation

## 2022-05-28 DIAGNOSIS — Z171 Estrogen receptor negative status [ER-]: Secondary | ICD-10-CM

## 2022-05-28 DIAGNOSIS — R000 Tachycardia, unspecified: Secondary | ICD-10-CM | POA: Insufficient documentation

## 2022-05-28 DIAGNOSIS — E039 Hypothyroidism, unspecified: Secondary | ICD-10-CM | POA: Insufficient documentation

## 2022-05-28 DIAGNOSIS — I10 Essential (primary) hypertension: Secondary | ICD-10-CM | POA: Insufficient documentation

## 2022-05-28 DIAGNOSIS — R5383 Other fatigue: Secondary | ICD-10-CM

## 2022-05-28 DIAGNOSIS — M545 Low back pain, unspecified: Secondary | ICD-10-CM | POA: Insufficient documentation

## 2022-05-28 DIAGNOSIS — G8929 Other chronic pain: Secondary | ICD-10-CM | POA: Insufficient documentation

## 2022-05-28 DIAGNOSIS — F17210 Nicotine dependence, cigarettes, uncomplicated: Secondary | ICD-10-CM | POA: Insufficient documentation

## 2022-05-28 DIAGNOSIS — Z136 Encounter for screening for cardiovascular disorders: Secondary | ICD-10-CM | POA: Insufficient documentation

## 2022-05-28 DIAGNOSIS — R001 Bradycardia, unspecified: Secondary | ICD-10-CM | POA: Insufficient documentation

## 2022-05-28 DIAGNOSIS — G4733 Obstructive sleep apnea (adult) (pediatric): Secondary | ICD-10-CM | POA: Insufficient documentation

## 2022-05-28 DIAGNOSIS — B370 Candidal stomatitis: Secondary | ICD-10-CM

## 2022-05-28 MED ORDER — HYDROCHLOROTHIAZIDE 25 MG TABLET
25.0000 mg | ORAL_TABLET | Freq: Every day | ORAL | 1 refills | Status: AC
Start: 2022-05-28 — End: ?

## 2022-05-28 MED ORDER — OMEPRAZOLE 20 MG CAPSULE,DELAYED RELEASE
20.0000 mg | DELAYED_RELEASE_CAPSULE | Freq: Every day | ORAL | 1 refills | Status: DC
Start: 2022-05-28 — End: 2023-02-23

## 2022-05-28 MED ORDER — DICYCLOMINE 20 MG TABLET
20.0000 mg | ORAL_TABLET | Freq: Four times a day (QID) | ORAL | 1 refills | Status: AC
Start: 2022-05-28 — End: ?

## 2022-05-28 MED ORDER — LOSARTAN 50 MG TABLET
50.0000 mg | ORAL_TABLET | Freq: Every day | ORAL | 1 refills | Status: DC
Start: 2022-05-28 — End: 2023-02-23

## 2022-05-28 MED ORDER — BECLOMETHASONE DIPROP 80 MCG/ACTUATION HFA BREATH ACTIVATED AEROSOL
1.0000 | INHALATION_SPRAY | Freq: Two times a day (BID) | RESPIRATORY_TRACT | 4 refills | Status: AC
Start: 2022-05-28 — End: ?

## 2022-05-28 MED ORDER — NYSTATIN 100,000 UNIT/ML ORAL SUSPENSION
5.0000 mL | Freq: Four times a day (QID) | ORAL | 1 refills | Status: AC
Start: 2022-05-28 — End: ?

## 2022-05-28 MED ORDER — ALBUTEROL SULFATE HFA 90 MCG/ACTUATION AEROSOL INHALER
1.0000 | INHALATION_SPRAY | Freq: Four times a day (QID) | RESPIRATORY_TRACT | 2 refills | Status: AC | PRN
Start: 2022-05-28 — End: ?

## 2022-05-28 MED ORDER — DILTIAZEM CD 120 MG CAPSULE,EXTENDED RELEASE 24 HR
120.0000 mg | ORAL_CAPSULE | Freq: Every day | ORAL | 1 refills | Status: DC
Start: 2022-05-28 — End: 2023-03-20

## 2022-05-28 MED ORDER — MONTELUKAST 10 MG TABLET
10.0000 mg | ORAL_TABLET | Freq: Every day | ORAL | 1 refills | Status: AC
Start: 2022-05-28 — End: ?

## 2022-05-28 MED ORDER — LEVOTHYROXINE 125 MCG TABLET
125.0000 ug | ORAL_TABLET | Freq: Every day | ORAL | 1 refills | Status: DC
Start: 2022-05-28 — End: 2022-06-23

## 2022-05-28 MED ORDER — LORATADINE 10 MG TABLET
10.0000 mg | ORAL_TABLET | Freq: Every day | ORAL | 1 refills | Status: DC
Start: 2022-05-28 — End: 2023-02-23

## 2022-05-28 NOTE — Progress Notes (Signed)
[facility]  [address]  [place], [place]  [address]  Phone: [phone] Fax: [phone]    Name: [name]                       Date of Birth: 01-22-1976   MRN:  [id number]                         Date of visit: 05/28/2022     Problem List Items Addressed This Visit       Hypothyroidism    Bradycardia    Tachycardia    Moderate persistent asthma without complication    Hypersomnia, unspecified    Fatigue    Obesity, unspecified    Gastroesophageal reflux disease    Chronic lower back pain    Obstructive sleep apnea    Kidney stones    Primary hypertension    Relevant Orders    CBC/DIFF    COMPREHENSIVE METABOLIC PNL, FASTING    Thumb pain, left    Oral candida    History of depression    Malignant neoplasm of upper-outer quadrant of right female breast (CMS [place])     Other Visit Diagnoses       Screening for cardiovascular condition        Relevant Orders    LDL CHOLESTEROL, DIRECT    LIPID PANEL    Encounter for hepatitis C screening test for low risk patient        Relevant Orders    HEPATITIS C ANTIBODY SCREEN WITH REFLEX TO HCV PCR    Screening for HIV without presence of risk factors        Relevant Orders    HIV 1 AND 2 RAPID SCREEN            Chief Complaint: Follow Up 3 Months (3 month follow up with medication refills. )    Past Medical History  Current Outpatient Medications   Medication Sig    albuterol sulfate (PROVENTIL OR VENTOLIN OR PROAIR) 90 mcg/actuation Inhalation oral inhaler Take 1-2 Puffs by inhalation Every 6 hours as needed    beclomethasone dipropionate (QVAR REDIHALER) 80 mcg/actuation Inhalation oral inhaler Take 1 Puff by inhalation Twice daily    dicyclomine (BENTYL) 20 mg Oral Tablet Take 1 Tablet (20 mg total) by mouth Four times a day    dilTIAZem (CARDIZEM CD) 120 mg Oral Capsule, Sust. Release 24 hr Take 1 Capsule (120 mg total) by mouth Once a day    gabapentin (NEURONTIN) 300 mg Oral Capsule Take 1 Capsule (300 mg total) by mouth Four times a day     hydroCHLOROthiazide (HYDRODIURIL) 25 mg Oral Tablet Take 1 Tablet (25 mg total) by mouth Once a day    levothyroxine (SYNTHROID) 125 mcg Oral Tablet Take 1 Tablet (125 mcg total) by mouth Once a day    loratadine (CLARITIN) 10 mg Oral Tablet Take 1 Tablet (10 mg total) by mouth Once a day    losartan (COZAAR) 50 mg Oral Tablet Take 1 Tablet (50 mg total) by mouth Once a day    Magic Mouthwash Swish and spit 10 mL Every 2 hours as needed for Sore throat Contains lidocaine viscous 2%, diphenhydramine 12.5 mg/5 ml, Maalox. Mix 1:1:1    melatonin 5 mg Oral Tablet, Chewable Chew 2 Tablets (10 mg total) Every night    montelukast (SINGULAIR) 10 mg Oral Tablet Take 1 Tablet (10 mg total) by mouth  Once a day    nystatin (MYCOSTATIN) 100,000 unit/mL Oral Suspension Take 5 mL by mouth Four times a day    omeprazole (PRILOSEC) 20 mg Oral Capsule, Delayed Release(E.C.) Take 1 Capsule (20 mg total) by mouth Once a day    ondansetron (ZOFRAN) 8 mg Oral Tablet Take 1 Tablet (8 mg total) by mouth Every 8 hours as needed for Nausea/Vomiting    prochlorperazine (COMPAZINE) 10 mg Oral Tablet Take 1 Tablet (10 mg total) by mouth Four times a day as needed for Nausea/Vomiting    sertraline (ZOLOFT) 100 mg Oral Tablet Take 0.5 Tablets (50 mg total) by mouth Once a day (Patient taking differently: Take 1 Tablet (100 mg total) by mouth Once a day)     Allergies   Allergen Reactions    Dexamethasone (Pf) Swelling    Tamsulosin Nausea/ Vomiting     Past Medical History:   Diagnosis Date    Allergic rhinitis     Anal fissure     Asthma     Bradycardia     Depression     Esophageal reflux     Hx of breast cancer     Hypersomnia     Hypertension     Hypothyroidism     Insomnia, unspecified     Kidney stones 06/06/2021    Nerve damage     Obesity, unspecified     Sleep apnea     Tachycardia, unspecified          Past Surgical History:   Procedure Laterality Date    HX BACK SURGERY      HX BREAST BIOPSY Right     HX HAND SURGERY Left     left  thumb    HX HYSTERECTOMY      HX LITHOTRIPSY      HX TONSILLECTOMY      HX TUBAL LIGATION Bilateral     PORTACATH PLACEMENT           Family Medical History:       Problem Relation (Age of Onset)    Asthma Brother, Maternal Grandmother    Breast Cancer Paternal Grandmother    Diabetes type II Father, Paternal Grandmother    Elevated Lipids Father    Heart Disease Father    Hypertension (High Blood Pressure) Mother, Father    No Known Problems Sister, Maternal Aunt, Maternal Uncle, Paternal 47, Paternal Uncle, Maternal Grandfather, Paternal Grandfather, Daughter, Son, Other            Social History     Socioeconomic History    Marital status: Married   Tobacco Use    Smoking status: Every Day     Current packs/day: 0.50     Average packs/day: 0.5 packs/day for 36.1 years (18.1 ttl pk-yrs)     Types: Cigarettes     Start date: 04/08/1986     Passive exposure: Never    Smokeless tobacco: Never   Vaping Use    Vaping status: Never Used   Substance and Sexual Activity    Alcohol use: Not Currently     Comment: occasionally    Drug use: Yes     Types: Marijuana     Comment: CBD Gummies          Patient Active Problem List    Diagnosis Date Noted    Breast cancer, right breast (CMS [place]) 04/26/2022    Malignant neoplasm of upper-outer quadrant of right female breast (CMS [place]) 03/24/2022    Primary  hypertension 09/10/2021    Thumb pain, left 09/10/2021    Oral candida 09/10/2021    History of depression 09/10/2021    Chronic lower back pain 06/06/2021    Obstructive sleep apnea 06/06/2021    Kidney stones 06/06/2021    Hypersomnia, unspecified 05/01/2021    Fatigue 05/01/2021    Obesity, unspecified 05/01/2021    Gastroesophageal reflux disease 05/01/2021    Hypothyroidism 09/19/2020    Bradycardia 09/19/2020    Tachycardia 08/11/2020    Moderate persistent asthma without complication [id number]         Subjective:   Rachel Mendoza is a 47 y.o.  female that presents today for routine f/u and CDM.      ROS:  10 systems  reviewed and were negative except as noted.     HPI:    Right breast mass:   03/07/2022:  Reports that she recently noted a mass in the right breast.    She has a round, firm nodule approximately the diameter of a quarter at approximately 9:00 p.m. position of the right breast.  Denies tenderness or pain initially but reports that since she felt it she has been manipulating it and it is more tender now.  No redness or warmth noted.    Reported previous mammogram although it had been several years.  She is uncertain where she had this done and no report was located.    No record of mammogram in [facility] or [facility].  Prior EMR, [facility] reviewed with notation that she had mammogram in 2016 but report is not in there.    No record at [facility] radiology.   Mammogram ordered and scheduled as recently as March of 2023 but she declined.    Diagnostic mammogram ordered and referred to Dr. Venia Minks.   03/13/2022:  Bilateral diagnostic mammogram:  There is a 2.0 cm x 2.2 cm x 2.1 cm high density, oval mass or possibly cyst with partially obscured margins seen in the right breast at 9 o'clock in the middle depth, 9 cm from the nipple. No associated suspicious micro-calcifications, skin thickening or retraction is seen mammographically. This finding is suspicious. There are non-enlarged benign appearing right axillary lymph nodes partially seen.  Further evaluation with Ultrasound is recommended.     03/20/2022:  Right breast ultrasound:  Irregular mass in the right breast at 9:00 a.m. that measures 2.9 x 2.2 x 1.8 cm.  There were also a few prominent lymph nodes seen in the right axilla, the largest measured 1.6 x 1.1 cm.  Biopsy performed by Dr. Venia Minks.    Pathology: Invasive ductal carcinoma.    She has had Port-A-Cath inserted and has initiated chemotherapy.  They have been unsuccessful in getting approval for necessary scans until recently; she has CT of the abdomen ordered next week.  Patient reports ultimately plan is to  have bilateral mastectomy once appropriate staging is completed.      Abdominal cramping:  Reports that following her chemo she has had severe abdominal cramping.   Describes her stools as loose.   She attributes to her chemo although she has been told this is not a common side effect.   Discussed addition of dicyclomine to see if this improves symptoms.    She has appointment to see Dr. Jake Shark on Friday and plans to discuss further with him.     Pain right SI joint, lower back, and right sciatica: Patient reports longstanding history of chronic pain in the right SI joint.  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  Presented initially with increased pain over the last several months with numbness and burning sensation down the right leg.  Positive straight leg raise.  Positive for tenderness at L4-L5 and extreme tenderness in the right SI joint.  07/06/2019: X-ray right hip and pelvis: Increased sclerosis symphysis pubis otherwise unremarkable pelvis and right hip x-ray.  X-ray LS spine:6 non rib-bearing lumbar vertebra the lower most is labeled L5 in this report. Mild retrolisthesis of L5 relative to L4 with narrowing in the L4-S1 discs and facet arthropathy. No definite acute bone abnormality.    Physical therapy initiated with some improvement.  Unable to walk or stand for sustained periods of time.  Amitriptyline initiated for nerve pain, depression, and insomnia.  Ultimately this was discontinued d/t increased agitation.    09/22/19: MRI LS spine: Minimal anteriolisthesis L4 and L5 vertebrae.  Negative for disc herniation at any level.  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  Multilevel neural foraminal stenosis.  Patient is now being followed by neurosurgery in [place].   Epidural steroid injections attempted without success.    Admission to [facility] from 08/09/2020 through 08/15/20.  08/09/2020: Right lumbar 4 transforaminal interbody  fusion, pedicle screw instrumentation completed by Dr. Gomez Cleverly at [facility].   She did have episode of tachycardia during hospitalization with rate up to 160.  Started on Cardizem.    08/29/2020:  Presents today postoperatively.  Reports she has already had staples removed and incision line is healing well.   Currently wearing brace support.     01/09/2021:  Patient called in August requesting order for PT due to increased pain in lower back and pain in right leg.   Dr. Jacklynn Ganong has retired and recommended that all of his patients find providers in the [place] area but he did not give recommendations or referrals.  She reports she is supposed to f/u at 6 months, which will be in November for repeat imaging and neurosurgical f/u.  Needs referral.   Referral to [facility] submitted and completed.       03/07/2021:  Referral submitted to [facility].  They requested repeat MRI prior to referral which has been requested and remains pending.    03/12/2021:  [facility]:  Interval postsurgical changes at L4-5 level with fixation device.  Normal alignment  No abnormal epidural collections, spinal stenosis or foraminal stenosis is visible.  Paravertebral soft tissues unremarkable.      06/06/2021:  Reports she is following at [facility].    No surgical concerns at this time.    Pain management is being conducted there.   Currently prescribed gabapentin.      Tachycardia/Bradycardia:   Patient previously reported continued concerns regarding bradycardia.  Reported that she was having episodes daily that would last from 30 seconds to 2 minutes.  Heart rate would drop to the 30s accompanied by chest pain, shortness of breath, and excessive yawning.  O2 saturations remained in the upper 90s during these episodes.  Spontaneous resolution.  She is unable to correlate these episodes with any other activities, medications, or activity.  EKGs in office have demonstrated  normal sinus rhythm.  Attributed to alterations in TSH.  These bradycardic episodes tend to improve and then recur - no recent concerns.   More recently, she has also reported episodes of tachycardia, palpitations.   48 hour holter  monitor ordered but patient did not complete.  During recent hospitalization, heart rate was noted to go up to 160s to 170s range, documented as sinus tachycardia.   Diltiazem was initiated with ultimate improvement.  Dose was titrated and she is currently on diltiazem 120 daily.   Currently following with Dr. Marcille Blanco, cardiology.      Obstructive sleep apnea:   Chronic concerns regarding fatigue and insomnia.  Prior to back surgery, routinely slept on her abdomen and of course is unable to do so now.  While sleeping on her back, she is noted increased snoring, increased insomnia, and increased daytime sleepiness.  Stop bang screening questionnaire completed: Positive responses to snoring, tired, observation of apnea, and hypertension.  Also BMI greater than 35 and neck size of 17 inches in a female.  Epworth screening also completed demonstrating moderate risk of excessive sleepiness.  10/31/2020: Home sleep study completed demonstrating severe OSA.  APAP Rx.    She is using this regularly and benefiting.        Kidney stones:  No current symptoms or concerns.  11/25/2019:  Patient presented to ED on 11/19/19 with left flank pain.  She has a history of kidney stones and states she has passed multiple stones in the past that were larger than the one she currently has identified by CT scan.  She is unable to take tamsulosin.  She has been trying to force fluids at home.  Reports pain has moved to the lower pelvic region and is no longer located in the flank area.  She has had intermittent periods of nausea since initiation of flank pain.  She has had lithotripsy in the past.  She is not currently followed by urology and does not have an appointment.  11/19/19:  CT abdomen/pelvis:  Left  obstructive uropathy with left hydronephrosis secondary to a 5 mm urethral calculus at the L2-3 level.  KUB ordered but patient did not complete.    12/28/2019:   Patient presents today with complaint of urinary urgency, pelvic pain, frequency, and bladder spasms.  Reports that she finally was able to pass her kidney stone but over the last week or so has developed these symptoms.  Reports that she was taking frequent tub baths for relief of pain related to the kidney stone and believes that she has developed a urinary tract infection.  Denies fever, flank pain, CVA tenderness, body aches or chills.  She has been taking Azo over-the-counter.   She did not have KUB completed after her last visit and reports that she feels the spasms are in the lower pelvis/bladder area and not related to stones.   Requests prescription for Diflucan if antibiotics are prescribed due to past history of yeast infections with antibiotic usage.  CLIA UA completed although patient has been taking AZO and results may not be accurate.  Urine was golden, trace protein, small amount of blood , positive nitrites.  We will submit for microscopic UA for confirmation.  Macrobid prescribed.   Microscopic UA: Negative.  Reflex culture not performed due to negative UA.    01/03/2020: Patient called with continued urinary symptoms. Antibiotic had been completed at that time. Advised to repeat UA and culture and have KUB - these recommendations were not completed.    01/27/2020:  Continues to have episodes of increased flank pain with dysuria and nausea.  The spasms spontaneously resolve and then recur.  She has not passed this stone.  KUB has not been completed.  Denies any  dysuria, frequency or concerns today.   Appointment is scheduled with urology next week.     03/22/2020: Kidney stone passed the night before lithotripsy on 02/02/2020. Tolerated procedure well. No urinary symptoms at this time.    Hypothyroidism: Has a history of hypothyroidism  since age 42.  Reports variability in TSH.  07/06/19:  TSH 16.03 Synthroid increased to 150 mcg daily from 125 mcg daily.  08/31/2019: TSH 1.05  09/29/2019: TSH 0.19, T4 5.6.  Dose decreased to 137 mcg daily.  11/25/2019:  TSH 1.63  01/27/2020:  TSH 0.26  Dose decreased to 125 mcg daily.   03/22/2020: TSH 0.73.  07/12/2020: Reports she accidentally was taking Synthroid 137 mcg daily.  Corrected this approximately 1 month ago and is back on 125 mcg daily.  07/12/2020: TSH 0.94  01/09/2021: TSH 1.13  06/06/2021: TSH 2.734  09/10/2021: TSH 0.626  03/07/2022: TSH 0.965   05/10/2022: TSH 1.834    Hypertension: Well-controlled on current regimen.  Patient on losartan daily and hydrochlorothiazide.  Chlorthalidone previously initiated due to continued hypertension.  Patient reported it made her drowsy and she was not taking regularly, this was changed to hydrochlorothiazide.     06/06/2021:  Cholesterol 164, triglycerides 129, direct LDL 108    Asthma: Longstanding history of asthma.    Has albuterol inhaler that she uses as needed as well as nebulizer.  Reports that she has had much less asthma flares since Covid pandemic due to environmental controls in addition to quitting smoking.   She intermittently uses Monteleukast and budesonide - currently using monteleukast.  Reports she does have s/s of SOB and wheezing. She does not currently have budesoninde.    She is using albuterol several times daily.   Discussed resuming inhaled ICS to help improve this.  QVAR Rx.      Allergic rhinitis: Currently on Claritin over-the-counter and Flonase once daily.     GERD: Currently on omeprazole 20 over-the-counter.  Reports this controls symptoms but if she misses a dose that she has recurrent heartburn.  Denies any difficulty swallowing.    Obesity:  07/06/2019: Weight 251, BMI 41.8  12/28/2019: Weight 257, BMI 42.8  01/27/2020: Weight 253, BMI 42.1.  Congratulated on recent weight loss.  She is currently using itrack bites to track her oral  intake.   03/22/2020:  Weight 252, BMI 42  07/12/2020: Weight 253, BMI 42  08/29/2020:  Weight 259, BMI 43.1  01/09/2021: Weight 256, BMI 42.5  03/07/2021: Weight 256, BMI 42.7  06/06/2021:  Weight 255.8, BMI 42.6  09/10/2021:  Weight 258, BMI 42.98  05/28/2022:  Weight 274, BMI 45.64    Anal fissure/hemorrhoids: No current concerns.   Recent development of anal fissure.  Surgical referral submitted and patient saw Dr. Eunice Blase in January 2023.    Reports past medical history of hemorrhoids.   Currently using Colace 1-2 times daily and Dulcolax without stimulant as needed for constipation.  Tried fiber supplement which worsen constipation.  Trying to ensure adequate fluid intake.  Takes sitz bath's as needed for significant pain and irritation.  Lidocaine cream prescribed but patient has not found much benefit with this until recently as anal fissure has begun to heal.  States that anal fissure and hemorrhoidal inflammation continue to improve.    Depression and anxiety:  She has struggled with symptoms of depression and anxiety over the last couple of years with multiple health issues.  Reports strong family history of bipolar disorder which she has suspected that  she has as well.  Reports previous manic episodes but these have resolved as she has gotten older.    Now suffers with depression symptoms.  Denies SI or HI.  Reports lack of enjoyment in daily life.  Reports preferring to stay in the bed instead of participating in activities with her family.  States she has been tried on different medications in the past and what has worked the best has been Engineer, civil (consulting).  Effexor made symptoms worse.  Reports using various other agents which also were unsuccessful but she is unable to remember the names.  Discussed counseling services but she wishes to defer.  01/09/2021: Zoloft initiated at 50 mg with increase to 75 mg.  03/07/2021:  Reports improvement but incomplete resolution.  She is interested in further increasing dosage. Zoloft  increased to 100 mg.    06/06/2021:  Reported she weaned her Zoloft and she has been placed on gabapentin.  She reports this has helped with her depression as well as her pain.  Denies SI or HI.    09/10/2021:  Reports recurrence of symptoms.  Tearful at times during visit.  PHQ 9 score 24 without SI or HI.  She has tried and failed several first line treatments.  Discussed referral to a specialist for med management.  She is agreeable today.  Encouraged to follow through.    05/28/2022:  Established at [facility].  Current medications include Zoloft 100 mg daily.    She has not been able to f/u recently d/t multiple other appointments and feeling poorly after chemo.  Reports that she is doing well on this medication.  Denies SI or HI.     Stomatitis/Oral candida:  She has had nystatin in the past with improvement.   Currently has magic mouthwash prn for stomatitis.     Insomnia:  Reports recurrent, daily issues with insomnia.    She was using melatonin Gummies with some improvement.  Reports she has been using CBD gummies recently which have helped with sleep, anxiety, and pain.  She is establishing care for medical marijuanna.      Left thumb pain: Not reviewed today.   09/10/2021:  Reports development of pain in the left thumb since last visit.   [facility] and was given oral steroids and advised regarding a brace to apply.  She completed the steroids without resolution.  She has been using a brace to the left hand/wrist without resolution.    Pain has improved since initial injury but continues to have pain in the distal joint.   At times the distal joint will lock or become displaced and she has to manually pop back into place resulting in severe pain.    No range of motion in distal thumb joint.    Influenza vaccination:  Reports she took the vaccine 4 years in a row previously and that each of the 4 years she became sick with flu-like symptoms following the vaccine and was very ill.  She no longer wishes to  take this.       Physical Exam:    General: cooperative, healthy appearing and no acute distress  Orientation/Consciousness: patient oriented x3  HENMT  Ears: hearing grossly normal bilaterally  Mouth: oral mucosae normal  Eyes  General: appearance normal, both eyes and all related structures  Neck  Neck: normal visual inspection and no lymphadenopathy  Resp  Effort & Inspection: normal respiratory effort  Auscultation: clear to auscultation bilaterally, no crackles and no wheezes  Cardio  Jugular venous pressure: no JVD  Palpation: normal PMI  Rate: regular rate  Rhythm: regular rhythm  Heart Sounds: S1 normal, S2 normal, no click, no murmurs and no rubs  Pulses: normal peripheral pulses  GI  Inspection: Yes normal to inspection  Palpation: soft, no hepatosplenomegaly, no guarding, no masses and nontender  Auscultation: normal bowel sounds  Neuro  General: patient oriented x3  Psych  Mental Status: mental status grossly normal  Mood: congruent mood  Affect: normal affect  Insight: insight good  Judgment: judgment good      Assessment/Plan:    Right breast cancer:  Continue f/u with oncology and surgery as scheduled.     Abdominal cramping:  dicyclomine prescribed.  Notify of worsening or failure to improve.  She will discuss further with Dr. Jake Shark on Friday.      Right sacroiliac pain/lower back pain and right sciatica: MRI reviewed.   F/u with [facility] as scheduled.    Tachycardia/previous episodes of bradycardia: Continue diltiazem.  Report to the emergency room for any palpitations, chest pain, shortness of breath, dizziness or other concerns.  Continue to f/u with cardiology.     Obstructive sleep apnea: Continue APAP nightly as prescribed.     Hypothyroidism: Continue levothyroxine 125 mcg.  TSH reviewed.     Hypertension: Continue losartan and hydrochlorothiazide.  Discussed blood pressure goal of 130-140/80-90.  Notify provider of sustained elevations above this target goal.  Verbalized  understanding.    Kidney stones: Notify provider of any return of symptoms including dysuria or pain. Monitor.    Asthma: Continue albuterol as needed for wheezing or shortness of breath.  Add QVAR.      Allergic rhinitis: Continue Claritin.      GERD: Continue omeprazole over-the-counter.      Obesity: Reviewed current BMI.  Monitor.    Anal fissure/Hemorrhoidal inflammation: Continue current regimen.    Depression/anxiety:  Continue f/u with [facility].  Continue Zoloft.  Notify of worsening symptoms or concerns.       Oral candida:  nystatin swish and swallow.      Insomnia: Continue melatonin Gummies.  Discussed sleep hygiene measures.    Recent labs reviewed.  Additional orders submitted to be drawn with next set of labs.     Selena Lesser, FNP-BC

## 2022-05-28 NOTE — Nursing Note (Signed)
Patient here for follow up with medication refills.  Patient states that her blood pressure is staying elevated.  Patient states that she has thrush and has had some sore in her mouth.

## 2022-05-31 ENCOUNTER — Ambulatory Visit (HOSPITAL_COMMUNITY)
Admission: RE | Admit: 2022-05-31 | Discharge: 2022-05-31 | Disposition: A | Discharge status: Home / Self Care | Payer: Commercial Managed Care - PPO | Source: Ambulatory Visit | Attending: HEMATOLOGY-ONCOLOGY | Admitting: HEMATOLOGY-ONCOLOGY

## 2022-05-31 ENCOUNTER — Ambulatory Visit
Admission: RE | Admit: 2022-05-31 | Discharge: 2022-05-31 | Disposition: A | Discharge status: Home / Self Care | Payer: Commercial Managed Care - PPO | Source: Ambulatory Visit | Attending: HEMATOLOGY-ONCOLOGY | Admitting: HEMATOLOGY-ONCOLOGY

## 2022-05-31 ENCOUNTER — Ambulatory Visit (HOSPITAL_BASED_OUTPATIENT_CLINIC_OR_DEPARTMENT_OTHER): Payer: Commercial Managed Care - PPO | Admitting: HEMATOLOGY-ONCOLOGY

## 2022-05-31 ENCOUNTER — Other Ambulatory Visit: Payer: Self-pay

## 2022-05-31 ENCOUNTER — Encounter (HOSPITAL_COMMUNITY): Payer: Self-pay | Admitting: HEMATOLOGY-ONCOLOGY

## 2022-05-31 ENCOUNTER — Encounter (HOSPITAL_COMMUNITY): Payer: Self-pay

## 2022-05-31 VITALS — BP 146/91 | HR 103 | Temp 96.1°F | Ht 65.0 in | Wt 273.7 lb

## 2022-05-31 VITALS — BP 111/65 | HR 80 | Temp 96.4°F | Resp 16

## 2022-05-31 DIAGNOSIS — Z5112 Encounter for antineoplastic immunotherapy: Secondary | ICD-10-CM | POA: Insufficient documentation

## 2022-05-31 DIAGNOSIS — Z171 Estrogen receptor negative status [ER-]: Secondary | ICD-10-CM

## 2022-05-31 DIAGNOSIS — Z5111 Encounter for antineoplastic chemotherapy: Secondary | ICD-10-CM | POA: Insufficient documentation

## 2022-05-31 DIAGNOSIS — C50411 Malignant neoplasm of upper-outer quadrant of right female breast: Secondary | ICD-10-CM | POA: Insufficient documentation

## 2022-05-31 LAB — COMPREHENSIVE METABOLIC PANEL, NON-FASTING
ALBUMIN/GLOBULIN RATIO: 1.2 (ref 0.8–1.4)
ALBUMIN: 4.2 g/dL (ref 3.5–5.7)
ALKALINE PHOSPHATASE: 94 U/L (ref 34–104)
ALT (SGPT): 28 U/L (ref 7–52)
ANION GAP: 7 mmol/L (ref 4–13)
AST (SGOT): 20 U/L (ref 13–39)
BILIRUBIN TOTAL: 0.4 mg/dL (ref 0.3–1.2)
BUN/CREA RATIO: 18 (ref 6–22)
BUN: 15 mg/dL (ref 7–25)
CALCIUM, CORRECTED: 9.2 mg/dL (ref 8.9–10.8)
CALCIUM: 9.4 mg/dL (ref 8.6–10.3)
CHLORIDE: 103 mmol/L (ref 98–107)
CO2 TOTAL: 28 mmol/L (ref 21–31)
CREATININE: 0.82 mg/dL (ref 0.60–1.30)
ESTIMATED GFR: 89 mL/min/{1.73_m2} (ref >59)
GLOBULIN: 3.5 (ref 2.9–5.4)
GLUCOSE: 106 mg/dL (ref 74–109)
OSMOLALITY, CALCULATED: 277 mOsm/kg (ref 270–290)
POTASSIUM: 3.8 mmol/L (ref 3.5–5.1)
PROTEIN TOTAL: 7.7 g/dL (ref 6.4–8.9)
SODIUM: 138 mmol/L (ref 136–145)

## 2022-05-31 LAB — CBC WITH DIFF
BASOPHIL #: 0.1 10*3/uL (ref 0.00–0.10)
BASOPHIL %: 1 % (ref 0–1)
EOSINOPHIL #: 0.1 10*3/uL (ref 0.00–0.50)
EOSINOPHIL %: 1 %
HCT: 36.4 % (ref 31.2–41.9)
HGB: 12.4 g/dL (ref 10.9–14.3)
LYMPHOCYTE #: 1.9 10*3/uL (ref 1.00–3.00)
LYMPHOCYTE %: 30 % (ref 16–44)
MCH: 29.4 pg (ref 24.7–32.8)
MCHC: 34.1 g/dL (ref 32.3–35.6)
MCV: 86.3 fL (ref 75.5–95.3)
MONOCYTE #: 0.3 10*3/uL (ref 0.30–1.00)
MONOCYTE %: 5 % (ref 5–13)
MPV: 7.3 fL — ABNORMAL LOW (ref 7.9–10.8)
NEUTROPHIL #: 4 10*3/uL (ref 1.85–7.80)
NEUTROPHIL %: 62 % (ref 43–77)
PLATELETS: 251 10*3/uL (ref 140–440)
RBC: 4.22 10*6/uL (ref 3.63–4.92)
RDW: 14 % (ref 12.3–17.7)
WBC: 6.4 10*3/uL (ref 3.8–11.8)

## 2022-05-31 LAB — THYROID STIMULATING HORMONE WITH FREE T4 REFLEX: TSH: 9.445 u[IU]/mL — ABNORMAL HIGH (ref 0.450–5.330)

## 2022-05-31 LAB — MAGNESIUM: MAGNESIUM: 1.7 mg/dL — ABNORMAL LOW (ref 1.9–2.7)

## 2022-05-31 MED ORDER — MEPERIDINE (PF) 25 MG/ML INJECTION SOLUTION
12.5000 mg | Freq: Once | INTRAMUSCULAR | Status: DC | PRN
Start: 2022-05-31 — End: 2022-06-01

## 2022-05-31 MED ORDER — PREDNISONE 10 MG TABLET
ORAL_TABLET | ORAL | Status: AC
Start: 2022-05-31 — End: 2022-05-31
  Filled 2022-05-31: qty 1

## 2022-05-31 MED ORDER — OLANZAPINE 5 MG TABLET
5.0000 mg | ORAL_TABLET | Freq: Once | ORAL | Status: AC
Start: 2022-05-31 — End: 2022-05-31
  Administered 2022-05-31: 5 mg via ORAL

## 2022-05-31 MED ORDER — DEXTROSE 5% IN WATER (D5W) FLUSH BAG - 250 ML
INTRAVENOUS | Status: AC | PRN
Start: 2022-05-31 — End: ?

## 2022-05-31 MED ORDER — PROCHLORPERAZINE MALEATE 10 MG TABLET
10.0000 mg | ORAL_TABLET | Freq: Four times a day (QID) | ORAL | Status: DC | PRN
Start: 2022-05-31 — End: 2022-06-01

## 2022-05-31 MED ORDER — FAMOTIDINE (PF) 20 MG/2 ML INTRAVENOUS SOLUTION
20.0000 mg | Freq: Once | INTRAVENOUS | Status: DC | PRN
Start: 2022-05-31 — End: 2022-06-01

## 2022-05-31 MED ORDER — LORAZEPAM 2 MG/ML INJECTION WRAPPER
0.5000 mg | Freq: Four times a day (QID) | INTRAMUSCULAR | Status: DC | PRN
Start: 2022-05-31 — End: 2022-06-01

## 2022-05-31 MED ORDER — ALBUTEROL SULFATE HFA 90 MCG/ACTUATION AEROSOL INHALER - RN
2.0000 | Freq: Once | RESPIRATORY_TRACT | Status: DC | PRN
Start: 2022-05-31 — End: 2022-06-01

## 2022-05-31 MED ORDER — PALONOSETRON 0.25 MG/5 ML INTRAVENOUS SOLUTION
0.2500 mg | Freq: Once | INTRAVENOUS | Status: AC
Start: 2022-05-31 — End: 2022-05-31
  Administered 2022-05-31: 0.25 mg via INTRAVENOUS

## 2022-05-31 MED ORDER — SODIUM CHLORIDE 0.9 % INTRAVENOUS SOLUTION
150.0000 mg | Freq: Once | INTRAVENOUS | Status: AC
Start: 2022-05-31 — End: 2022-05-31
  Administered 2022-05-31: 0 mg via INTRAVENOUS
  Administered 2022-05-31: 150 mg via INTRAVENOUS
  Filled 2022-05-31: qty 5

## 2022-05-31 MED ORDER — SODIUM CHLORIDE 0.9 % INTRAVENOUS SOLUTION
80.0000 mg/m2 | Freq: Once | INTRAVENOUS | Status: AC
Start: 2022-05-31 — End: 2022-05-31
  Administered 2022-05-31: 0 mg via INTRAVENOUS
  Administered 2022-05-31: 189 mg via INTRAVENOUS
  Filled 2022-05-31: qty 31.5

## 2022-05-31 MED ORDER — ALBUTEROL SULFATE 2.5 MG/3 ML (0.083 %) SOLUTION FOR NEBULIZATION
2.5000 mg | INHALATION_SOLUTION | Freq: Once | RESPIRATORY_TRACT | Status: DC | PRN
Start: 2022-05-31 — End: 2022-06-01

## 2022-05-31 MED ORDER — SODIUM CHLORIDE 0.9 % INTRAVENOUS SOLUTION
200.0000 mg | Freq: Once | INTRAVENOUS | Status: AC
Start: 2022-05-31 — End: 2022-05-31
  Administered 2022-05-31: 0 mg via INTRAVENOUS
  Administered 2022-05-31: 200 mg via INTRAVENOUS
  Filled 2022-05-31: qty 8

## 2022-05-31 MED ORDER — SODIUM CHLORIDE 0.9 % IV BOLUS
1000.0000 mL | INJECTION | Freq: Once | Status: AC
Start: 2022-05-31 — End: 2022-05-31
  Administered 2022-05-31: 0 mL via INTRAVENOUS
  Administered 2022-05-31: 1000 mL via INTRAVENOUS

## 2022-05-31 MED ORDER — PALONOSETRON 0.25 MG/5 ML INTRAVENOUS SOLUTION
INTRAVENOUS | Status: AC
Start: 2022-05-31 — End: 2022-05-31
  Filled 2022-05-31: qty 5

## 2022-05-31 MED ORDER — SODIUM CHLORIDE 0.9% FLUSH BAG - 250 ML
INTRAVENOUS | Status: DC | PRN
Start: 2022-05-31 — End: 2022-06-01

## 2022-05-31 MED ORDER — DIPHENHYDRAMINE 50 MG/ML INJECTION SOLUTION
25.0000 mg | Freq: Once | INTRAMUSCULAR | Status: DC | PRN
Start: 2022-05-31 — End: 2022-06-01

## 2022-05-31 MED ORDER — SODIUM CHLORIDE 0.9 % INTRAVENOUS SOLUTION
684.0000 mg | Freq: Once | INTRAVENOUS | Status: AC
Start: 2022-05-31 — End: 2022-05-31
  Administered 2022-05-31: 685 mg via INTRAVENOUS
  Administered 2022-05-31: 0 mg via INTRAVENOUS
  Filled 2022-05-31: qty 68.5

## 2022-05-31 MED ORDER — DIPHENHYDRAMINE 50 MG/ML INJECTION SOLUTION
25.0000 mg | Freq: Once | INTRAMUSCULAR | Status: AC
Start: 2022-05-31 — End: 2022-05-31
  Administered 2022-05-31: 25 mg via INTRAVENOUS

## 2022-05-31 MED ORDER — LORAZEPAM 0.5 MG TABLET
0.5000 mg | ORAL_TABLET | Freq: Four times a day (QID) | ORAL | Status: DC | PRN
Start: 2022-05-31 — End: 2022-06-01

## 2022-05-31 MED ORDER — MICONAZOLE NITRATE 2 % TOPICAL CREAM
TOPICAL_CREAM | Freq: Two times a day (BID) | CUTANEOUS | 2 refills | Status: DC
Start: 2022-05-31 — End: 2022-10-25

## 2022-05-31 MED ORDER — PROCHLORPERAZINE EDISYLATE 10 MG/2 ML (5 MG/ML) INJECTION SOLUTION
10.0000 mg | Freq: Four times a day (QID) | INTRAMUSCULAR | Status: DC | PRN
Start: 2022-05-31 — End: 2022-06-01

## 2022-05-31 MED ORDER — HYDROCORTISONE SOD SUCCINATE 100 MG/2 ML VIAL WRAPPER
100.0000 mg | Freq: Once | INTRAMUSCULAR | Status: DC | PRN
Start: 2022-05-31 — End: 2022-06-01

## 2022-05-31 MED ORDER — FAMOTIDINE (PF) 20 MG/2 ML INTRAVENOUS SOLUTION
INTRAVENOUS | Status: AC
Start: 2022-05-31 — End: 2022-05-31
  Filled 2022-05-31: qty 2

## 2022-05-31 MED ORDER — DIPHENHYDRAMINE 50 MG/ML INJECTION SOLUTION
INTRAMUSCULAR | Status: AC
Start: 2022-05-31 — End: 2022-05-31
  Filled 2022-05-31: qty 1

## 2022-05-31 MED ORDER — EPINEPHRINE HCL (PF) 1 MG/ML (1 ML) INJECTION SOLUTION
0.3000 mg | Freq: Once | INTRAMUSCULAR | Status: DC | PRN
Start: 2022-05-31 — End: 2022-06-01

## 2022-05-31 MED ORDER — OLANZAPINE 5 MG TABLET
ORAL_TABLET | ORAL | Status: AC
Start: 2022-05-31 — End: 2022-05-31
  Filled 2022-05-31: qty 1

## 2022-05-31 MED ORDER — PREDNISONE 10 MG TABLET
10.0000 mg | ORAL_TABLET | Freq: Once | ORAL | Status: AC
Start: 2022-05-31 — End: 2022-05-31
  Administered 2022-05-31: 10 mg via ORAL

## 2022-05-31 MED ORDER — FAMOTIDINE (PF) 20 MG/2 ML INTRAVENOUS SOLUTION
20.0000 mg | Freq: Once | INTRAVENOUS | Status: AC
Start: 2022-05-31 — End: 2022-05-31
  Administered 2022-05-31: 20 mg via INTRAVENOUS

## 2022-05-31 MED ORDER — DIPHENHYDRAMINE 50 MG/ML INJECTION SOLUTION
50.0000 mg | Freq: Once | INTRAMUSCULAR | Status: DC | PRN
Start: 2022-05-31 — End: 2022-06-01

## 2022-05-31 NOTE — Nurses Notes (Signed)
0850 - Patient ambulatory to room for treatment following clinic appointment. Assessment complete. Lungs clear throughout lung fields. Abdomen soft and non-tender, bowel sounds present. No edema noted. Patient complains of a headache rated at a 4. [facility], RN  Patient Assessment/Symptom Management Patient Has MD Appointment Today   Key: (+) Symptom present           (-)  Symptom not present If Symptom is Positive(+) a Nursing Note is required   Edema -   Uncontrolled Nausea -   Vomiting -   Inability to eat/drink -   Mouth Sores -   Diarrhea -   Constipation (? Last BM) -   Fatigue that interferes with ADL's -   Numbness/Tingling -change -   Other -   Fever/Signs & Symptoms of infection -   Nurse Initials JD   [phone] - PAC accessed at this time. Patient tolerated well. [facility], RN  [phone] - Patient voices multiple side effects on immunotherapy flowsheet. Dr. Jake Shark made aware. Dr. Jake Shark also notified of yeast like rash under skin folds, he said he would send in a prescription for nystatin cream.   0926 - 0931 - Premedications Prednisone, Zyprexa, Aloxi, Benadryl, and Pepcid given. [facility], RN  [phone] - NS 1 liter bolus started. [facility], RN  [phone] - Emend infusion started. [facility], RN  [phone] - Emend infusion complete. [facility], RN  [phone] - Keytruda infusion started. [facility], RN  1037 - NS 1 liter bolus complete. Line flushing. [facility], RN  [phone] - Keytruda infusion complete. Line flushing. [facility], RN  1051 - Taxol infusion started. [facility], RN  [phone] - Taxol infusion complete. Line flushing. [facility], RN  [phone] - Carboplatin infusion started. [facility], RN  [phone] - Carboplatin infusion complete. Line flushing. [facility], RN  1330 - PAC flushed with 76m NS and deaccessed. Gauze dressing and adhesive bandage applied. JShepard General RN  [phone]- Patient left via ambulation at this time. JShepard General RN

## 2022-05-31 NOTE — Progress Notes (Unsigned)
Department of Hematology/Oncology  History and Physical    Name: Rachel Mendoza  [id number]  Date of Birth: [birth year]  Encounter Date: 05/31/2022    REFERRING PROVIDER:  No referring provider defined for this encounter.    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    HISTORY OF PRESENT ILLNESS:  Rachel Mendoza is a 47 y.o. female who presents today for initial medical oncology consultation regarding triple negative breast cancer.  The patient discovered a lump which was evaluated with mammogram, ultrasound, and biopsy.  The primary lesion measures in the range of 2.2-2.9 cm, and there were some nearby lymph nodes that were described as prominent although not obviously involved.  The narrow measurement for the lymph nodes was in the range of 1.1 cm.    The biopsy showed invasive ductal malignancy that was triple negative.  BRCA testing is currently pending.  Based on the above information, she was referred for consideration of neoadjuvant chemotherapy.    05/10/2022: The patient is here for follow up of triple negative breast cancer.  We still do not have the BRCA result.  We have also been trying to obtain a pretreatment PET-CT, but have not been able to obtain approval for it yet.    05/31/2022: The patient is here for follow up of triple negative breast cancer.  She states that she has noticed a dramatic improvement in the size of the lesion.  She states that she struggles to palpate it now.    ROS:   Review of Systems   Constitutional:  Negative for appetite change, chills and fatigue.   HENT:   Negative for sore throat and trouble swallowing.    Eyes:  Negative for eye problems.   Respiratory:  Negative for cough and shortness of breath.    Cardiovascular:  Negative for chest pain and leg swelling.   Gastrointestinal:  Negative for abdominal pain.   Genitourinary:  Negative for dysuria and hematuria.    Musculoskeletal:  Negative for  arthralgias and gait problem.   Skin:  Negative for rash.   Neurological:  Negative for gait problem.   Hematological:  Negative for adenopathy.   Psychiatric/Behavioral:  Negative for depression.         HISTORY:  Past Medical History:   Diagnosis Date    Allergic rhinitis     Anal fissure     Asthma     Bradycardia     Depression     Esophageal reflux     Hx of breast cancer     Hypersomnia     Hypertension     Hypothyroidism     Insomnia, unspecified     Kidney stones 06/06/2021    Nerve damage     Obesity, unspecified     Sleep apnea     Tachycardia, unspecified          Past Surgical History:   Procedure Laterality Date    HX BACK SURGERY      HX BREAST BIOPSY Right     HX HAND SURGERY Left     left thumb    HX HYSTERECTOMY      HX LITHOTRIPSY      HX TONSILLECTOMY      HX TUBAL LIGATION Bilateral     PORTACATH PLACEMENT           Social History     Socioeconomic History    Marital status: Married  Spouse name: Not on file    Number of children: Not on file    Years of education: Not on file    Highest education level: Not on file   Occupational History    Not on file   Tobacco Use    Smoking status: Every Day     Current packs/day: 0.50     Average packs/day: 0.5 packs/day for 36.1 years (18.1 ttl pk-yrs)     Types: Cigarettes     Start date: 04/08/1986     Passive exposure: Never    Smokeless tobacco: Never   Vaping Use    Vaping status: Never Used   Substance and Sexual Activity    Alcohol use: Not Currently     Comment: occasionally    Drug use: Yes     Types: Marijuana     Comment: CBD Gummies    Sexual activity: Not on file   Other Topics Concern    Not on file   Social History Narrative    Not on file     Social Determinants of Health     Financial Resource Strain: Not on file   Transportation Needs: Not on file   Social Connections: Not on file   Intimate Partner Violence: Not on file   Housing Stability: Not on file     Family Medical History:       Problem Relation (Age of Onset)    Asthma Brother,  Maternal Grandmother    Breast Cancer Paternal Grandmother    Diabetes type II Father, Paternal Grandmother    Elevated Lipids Father    Heart Disease Father    Hypertension (High Blood Pressure) Mother, Father    No Known Problems Sister, Maternal Aunt, Maternal Uncle, Paternal 70, Paternal Uncle, Maternal Grandfather, Paternal Grandfather, Daughter, Son, Other            Current Outpatient Medications   Medication Sig    albuterol sulfate (PROVENTIL OR VENTOLIN OR PROAIR) 90 mcg/actuation Inhalation oral inhaler Take 1-2 Puffs by inhalation Every 6 hours as needed    beclomethasone dipropionate (QVAR REDIHALER) 80 mcg/actuation Inhalation oral inhaler Take 1 Puff by inhalation Twice daily    dicyclomine (BENTYL) 20 mg Oral Tablet Take 1 Tablet (20 mg total) by mouth Four times a day    dilTIAZem (CARDIZEM CD) 120 mg Oral Capsule, Sust. Release 24 hr Take 1 Capsule (120 mg total) by mouth Once a day    gabapentin (NEURONTIN) 300 mg Oral Capsule Take 1 Capsule (300 mg total) by mouth Four times a day    hydroCHLOROthiazide (HYDRODIURIL) 25 mg Oral Tablet Take 1 Tablet (25 mg total) by mouth Once a day    levothyroxine (SYNTHROID) 125 mcg Oral Tablet Take 1 Tablet (125 mcg total) by mouth Once a day    LIDOCAINE VISCOUS 2 % Mucous Membrane Solution Once per day as needed    loratadine (CLARITIN) 10 mg Oral Tablet Take 1 Tablet (10 mg total) by mouth Once a day    losartan (COZAAR) 50 mg Oral Tablet Take 1 Tablet (50 mg total) by mouth Once a day    Magic Mouthwash Swish and spit 10 mL Every 2 hours as needed for Sore throat Contains lidocaine viscous 2%, diphenhydramine 12.5 mg/5 ml, Maalox. Mix 1:1:1    montelukast (SINGULAIR) 10 mg Oral Tablet Take 1 Tablet (10 mg total) by mouth Once a day    nystatin (MYCOSTATIN) 100,000 unit/mL Oral Suspension Take 5 mL by mouth Four times  a day    omeprazole (PRILOSEC) 20 mg Oral Capsule, Delayed Release(E.C.) Take 1 Capsule (20 mg total) by mouth Once a day    ondansetron  (ZOFRAN) 8 mg Oral Tablet Take 1 Tablet (8 mg total) by mouth Every 8 hours as needed for Nausea/Vomiting    prochlorperazine (COMPAZINE) 10 mg Oral Tablet Take 1 Tablet (10 mg total) by mouth Four times a day as needed for Nausea/Vomiting    sertraline (ZOLOFT) 100 mg Oral Tablet Take 0.5 Tablets (50 mg total) by mouth Once a day (Patient taking differently: Take 1 Tablet (100 mg total) by mouth Once a day)     Allergies   Allergen Reactions    Dexamethasone (Pf) Swelling    Tamsulosin Nausea/ Vomiting       PHYSICAL EXAM:  Most Recent Vitals    Flowsheet Row Telemedicine from 04/24/2022 in Hematology/Oncology,   [facility]   Temperature 36.6 C (97.8 F) filed at... 04/24/2022 1338   Heart Rate 104 filed at... 04/24/2022 1338   Respiratory Rate --   BP (Non-Invasive) 147/99 filed at... 04/24/2022 1338   SpO2 96 % filed at... 04/24/2022 1338   Height 1.651 m ('5\' 5"'$ ) filed at... 04/24/2022 1338   Weight 121 kg (266 lb 6.4 oz) filed at... 04/24/2022 1338   BMI (Calculated) 44.42 filed at... 04/24/2022 1338   BSA (Calculated) 2.35 filed at... 04/24/2022 1338      ECOG Status: (0) Fully active, able to carry on all predisease performance without restriction   Physical Exam    DIAGNOSTIC DATA:  No results found for this or any previous visit (from the past 17520 hour(s)).    LABS:   CBC  Diff   Lab Results   Component Value Date/Time    WBC 6.4 05/31/2022 08:16 AM    HGB 12.4 05/31/2022 08:16 AM    HCT 36.4 05/31/2022 08:16 AM    PLTCNT 251 05/31/2022 08:16 AM    RBC 4.22 05/31/2022 08:16 AM    MCV 86.3 05/31/2022 08:16 AM    MCHC 34.1 05/31/2022 08:16 AM    MCH 29.4 05/31/2022 08:16 AM    RDW 14.0 05/31/2022 08:16 AM    MPV 7.3 (L) 05/31/2022 08:16 AM    Lab Results   Component Value Date/Time    PMNS 62 05/31/2022 08:16 AM    LYMPHOCYTES 30 05/31/2022 08:16 AM    EOSINOPHIL 1 05/31/2022 08:16 AM    MONOCYTES 5 05/31/2022 08:16 AM    BASOPHILS 1 05/31/2022 08:16 AM    BASOPHILS 0.10 05/31/2022 08:16 AM     PMNABS 4.00 05/31/2022 08:16 AM    LYMPHSABS 1.90 05/31/2022 08:16 AM    EOSABS 0.10 05/31/2022 08:16 AM    MONOSABS 0.30 05/31/2022 08:16 AM            ASSESSMENT:    ICD-10-CM    1. Malignant neoplasm of upper-outer quadrant of right breast in female, estrogen receptor negative (CMS [place])   C50.411     Z17.1            PLAN:   1. All relevant medical records were reviewed including available pertinent provider notes, procedure notes, imaging, laboratory, and pathology.   2. All pertinent labs and/or imaging were reviewed with the patient.   3. Triple negative breast cancer:  We previously reviewed the staging information with the patient, and reviewed the NCCN guidelines.  She is either T2 N0 M0 or T2 N1 M0.  Either one of those  would be categorized as stage II.  According to NCCN guidelines, this falls into the high-risk triple negative category. BRCA mutation status is pending. Continue current treatment.     LUN BREIT was given the chance to ask questions, and these were answered to their satisfaction. The patient is welcome to call with any questions or concerns in the meantime.     On the day of the encounter, a total of 35 minutes was spent on this patient encounter including review of historical information, examination, documentation and post-visit activities.   Return in about 3 weeks (around 06/21/2022).     Narda Rutherford, MD  05/31/2022, 08:53  The patient's insurance company bears full legal and financial responsibility resulting from any deviations that they cause to my recommended treatment plan.   CC:  Selena Lesser, FNP-BC  [place] [phone]    No referring provider defined for this encounter.    This note was partially generated using MModal Fluency Direct system, and there may be some incorrect words, spellings, and punctuation that were not noted in checking the note before saving.

## 2022-06-03 ENCOUNTER — Ambulatory Visit
Admission: RE | Admit: 2022-06-03 | Discharge: 2022-06-03 | Disposition: A | Discharge status: Home / Self Care | Payer: Commercial Managed Care - PPO | Source: Ambulatory Visit | Attending: HEMATOLOGY-ONCOLOGY | Admitting: HEMATOLOGY-ONCOLOGY

## 2022-06-03 ENCOUNTER — Telehealth (RURAL_HEALTH_CENTER): Payer: Self-pay | Admitting: Family

## 2022-06-03 ENCOUNTER — Other Ambulatory Visit: Payer: Self-pay

## 2022-06-03 DIAGNOSIS — C50411 Malignant neoplasm of upper-outer quadrant of right female breast: Secondary | ICD-10-CM | POA: Insufficient documentation

## 2022-06-03 DIAGNOSIS — Z171 Estrogen receptor negative status [ER-]: Secondary | ICD-10-CM | POA: Insufficient documentation

## 2022-06-03 MED ORDER — BARIUM SULFATE 2 % (W/V) ORAL SUSPENSION
450.0000 mL | ORAL | Status: AC
Start: 2022-06-03 — End: 2022-06-03
  Administered 2022-06-03: 450 mL via ORAL

## 2022-06-03 MED ORDER — IOHEXOL 350 MG IODINE/ML INTRAVENOUS SOLUTION
100.0000 mL | INTRAVENOUS | Status: AC
Start: 2022-06-03 — End: 2022-06-03
  Administered 2022-06-03: 100 mL via INTRAVENOUS

## 2022-06-03 NOTE — Telephone Encounter (Signed)
-----   Message from Rachel Lesser, FNP-BC sent at 05/31/2022  2:51 PM EST -----  Double-check with her that she has been taking her thyroid medicine regularly.  On this set of labs, her TSH is elevated.  If she has been taking as prescribed, I will increase her dose; otherwise we will recheck this in 1 month.

## 2022-06-03 NOTE — Telephone Encounter (Signed)
Called pt left message for her to call back

## 2022-06-03 NOTE — Telephone Encounter (Signed)
Pt called back.. she has been taking her thyroid meds regularly she states so i told her to be on the lookout for a new script

## 2022-06-07 ENCOUNTER — Other Ambulatory Visit: Payer: Self-pay

## 2022-06-07 ENCOUNTER — Ambulatory Visit
Admission: RE | Admit: 2022-06-07 | Discharge: 2022-06-07 | Disposition: A | Discharge status: Home / Self Care | Payer: Commercial Managed Care - PPO | Source: Ambulatory Visit | Attending: HEMATOLOGY-ONCOLOGY | Admitting: HEMATOLOGY-ONCOLOGY

## 2022-06-07 VITALS — BP 126/84 | HR 93 | Temp 99.0°F | Resp 18 | Ht 65.0 in | Wt 274.1 lb

## 2022-06-07 DIAGNOSIS — Z5111 Encounter for antineoplastic chemotherapy: Secondary | ICD-10-CM | POA: Insufficient documentation

## 2022-06-07 DIAGNOSIS — C50411 Malignant neoplasm of upper-outer quadrant of right female breast: Secondary | ICD-10-CM | POA: Insufficient documentation

## 2022-06-07 DIAGNOSIS — Z171 Estrogen receptor negative status [ER-]: Secondary | ICD-10-CM | POA: Insufficient documentation

## 2022-06-07 LAB — CBC WITH DIFF
BASOPHIL #: 0.1 10*3/uL (ref 0.00–0.10)
BASOPHIL %: 1 % (ref 0–1)
EOSINOPHIL #: 0.1 10*3/uL (ref 0.00–0.50)
EOSINOPHIL %: 1 %
HCT: 34 % (ref 31.2–41.9)
HGB: 11.4 g/dL (ref 10.9–14.3)
LYMPHOCYTE #: 1.9 10*3/uL (ref 1.00–3.00)
LYMPHOCYTE %: 33 % (ref 16–44)
MCH: 29.1 pg (ref 24.7–32.8)
MCHC: 33.5 g/dL (ref 32.3–35.6)
MCV: 86.7 fL (ref 75.5–95.3)
MONOCYTE #: 0.1 10*3/uL — ABNORMAL LOW (ref 0.30–1.00)
MONOCYTE %: 2 % — ABNORMAL LOW (ref 5–13)
MPV: 7.2 fL — ABNORMAL LOW (ref 7.9–10.8)
NEUTROPHIL #: 3.7 10*3/uL (ref 1.85–7.80)
NEUTROPHIL %: 63 % (ref 43–77)
PLATELETS: 265 10*3/uL (ref 140–440)
RBC: 3.92 10*6/uL (ref 3.63–4.92)
RDW: 14.2 % (ref 12.3–17.7)
WBC: 5.8 10*3/uL (ref 3.8–11.8)

## 2022-06-07 MED ORDER — SODIUM CHLORIDE 0.9 % INTRAVENOUS SOLUTION
80.0000 mg/m2 | Freq: Once | INTRAVENOUS | Status: AC
Start: 2022-06-07 — End: 2022-06-07
  Administered 2022-06-07: 189 mg via INTRAVENOUS
  Administered 2022-06-07: 0 mg via INTRAVENOUS
  Filled 2022-06-07: qty 31.5

## 2022-06-07 MED ORDER — DIPHENHYDRAMINE 50 MG/ML INJECTION SOLUTION
50.0000 mg | Freq: Once | INTRAMUSCULAR | Status: DC | PRN
Start: 2022-06-07 — End: 2022-06-08

## 2022-06-07 MED ORDER — ALBUTEROL SULFATE 2.5 MG/3 ML (0.083 %) SOLUTION FOR NEBULIZATION
2.5000 mg | INHALATION_SOLUTION | Freq: Once | RESPIRATORY_TRACT | Status: DC | PRN
Start: 2022-06-07 — End: 2022-06-08

## 2022-06-07 MED ORDER — FAMOTIDINE (PF) 20 MG/2 ML INTRAVENOUS SOLUTION
INTRAVENOUS | Status: AC
Start: 2022-06-07 — End: 2022-06-07
  Filled 2022-06-07: qty 2

## 2022-06-07 MED ORDER — PREDNISONE 10 MG TABLET
ORAL_TABLET | ORAL | Status: AC
Start: 2022-06-07 — End: 2022-06-07
  Filled 2022-06-07: qty 1

## 2022-06-07 MED ORDER — ALBUTEROL SULFATE HFA 90 MCG/ACTUATION AEROSOL INHALER - RN
2.0000 | Freq: Once | RESPIRATORY_TRACT | Status: DC | PRN
Start: 2022-06-07 — End: 2022-06-08

## 2022-06-07 MED ORDER — EPINEPHRINE HCL (PF) 1 MG/ML (1 ML) INJECTION SOLUTION
0.3000 mg | Freq: Once | INTRAMUSCULAR | Status: DC | PRN
Start: 2022-06-07 — End: 2022-06-08

## 2022-06-07 MED ORDER — ONDANSETRON HCL (PF) 4 MG/2 ML INJECTION SOLUTION
8.0000 mg | Freq: Once | INTRAMUSCULAR | Status: AC
Start: 2022-06-07 — End: 2022-06-07
  Administered 2022-06-07: 8 mg via INTRAVENOUS

## 2022-06-07 MED ORDER — FAMOTIDINE (PF) 20 MG/2 ML INTRAVENOUS SOLUTION
20.0000 mg | Freq: Once | INTRAVENOUS | Status: DC | PRN
Start: 2022-06-07 — End: 2022-06-08

## 2022-06-07 MED ORDER — DIPHENHYDRAMINE 50 MG/ML INJECTION SOLUTION
25.0000 mg | Freq: Once | INTRAMUSCULAR | Status: DC | PRN
Start: 2022-06-07 — End: 2022-06-08

## 2022-06-07 MED ORDER — DIPHENHYDRAMINE 50 MG/ML INJECTION SOLUTION
INTRAMUSCULAR | Status: AC
Start: 2022-06-07 — End: 2022-06-07
  Filled 2022-06-07: qty 1

## 2022-06-07 MED ORDER — DIPHENHYDRAMINE 50 MG/ML INJECTION SOLUTION
25.0000 mg | Freq: Once | INTRAMUSCULAR | Status: AC
Start: 2022-06-07 — End: 2022-06-07
  Administered 2022-06-07: 25 mg via INTRAVENOUS

## 2022-06-07 MED ORDER — FAMOTIDINE (PF) 20 MG/2 ML INTRAVENOUS SOLUTION
20.0000 mg | Freq: Once | INTRAVENOUS | Status: AC
Start: 2022-06-07 — End: 2022-06-07
  Administered 2022-06-07: 20 mg via INTRAVENOUS

## 2022-06-07 MED ORDER — PREDNISONE 10 MG TABLET
10.0000 mg | ORAL_TABLET | Freq: Once | ORAL | Status: AC
Start: 2022-06-07 — End: 2022-06-07
  Administered 2022-06-07: 10 mg via ORAL

## 2022-06-07 MED ORDER — HYDROCORTISONE SOD SUCCINATE 100 MG/2 ML VIAL WRAPPER
100.0000 mg | Freq: Once | INTRAMUSCULAR | Status: DC | PRN
Start: 2022-06-07 — End: 2022-06-08

## 2022-06-07 MED ORDER — ONDANSETRON HCL (PF) 4 MG/2 ML INJECTION SOLUTION
INTRAMUSCULAR | Status: AC
Start: 2022-06-07 — End: 2022-06-07
  Filled 2022-06-07: qty 4

## 2022-06-07 MED ORDER — MEPERIDINE (PF) 25 MG/ML INJECTION SOLUTION
12.5000 mg | Freq: Once | INTRAMUSCULAR | Status: DC | PRN
Start: 2022-06-07 — End: 2022-06-08

## 2022-06-07 NOTE — Nurses Notes (Signed)
1113-Arrived to OP ONC ambulatory and accompanied by spouse.  Here for Taxol infusion.  To room 411. Judithann Sauger, RN  1128-Assessment completed.  Sitting up in chair.  Spouse seated beside pt.  Awake, alert, and oriented x 3.  Per pt, she started feeling better yesterday and was able to eat well.  No voiced complaints of acute pain or discomfort but does have chronic low back pain.  Pain, currently, rated 3/10 on pain scale and described as sore, achy, and, at times, stabbing but tolerable.   No, current,  voiced complaints of mouth sores, N/V, constipation, or diarrhea.   HRR.  Lungs clear in all fields and bases.  Abdomen soft and non-tender with active bowel sounds in all four quadrants.  Trace edema noted to bilateral ankles.  No s/s of acute distress noted.  VSS.  Judithann Sauger, RN  1135-Right upper chest PortACath accessed per protocol.  Excellent blood return noted.  48m blood wasted.  Labs obtained from port.  Flushed with 30 ml NS without difficulty.  Transparent, non-bordered dressing applied.  Tolerated well.  Labs sent to main hospital lab through tube system. AJudithann Sauger RN    1220-Prednisone 10 mg PO administered per premedication order. AJudithann Sauger RN  1223-Pepcid 20 mg IVP administered per premedication order.  AJudithann Sauger RN   1224-Zofran '8mg'$  IVP administered per premedication order. AJudithann Sauger RN  1227-Benadryl 25 mg IVP administered per premedication order. AJudithann Sauger RN  1251-Taxol infusion started.  Resting quietly with no voiced complaints offered. AJudithann Sauger RN  1351-Taxol infusion complete.  Does have slight HA.  States she usually does get a HA after finishing Taxol but, normally, she's already home.  Per pt, she has Tylenol in her car.  Instructions given to take Tylenol as soon as she gets to vehicle.  Verbalized understanding.  AJudithann Sauger RN  [phone] Left OP NC ambulatory and accompanied by spouse.  No other  s/s of acute distress.  VSS.  AJudithann Sauger RN

## 2022-06-14 ENCOUNTER — Ambulatory Visit
Admission: RE | Admit: 2022-06-14 | Discharge: 2022-06-14 | Disposition: A | Discharge status: Home / Self Care | Payer: Commercial Managed Care - PPO | Source: Ambulatory Visit | Attending: HEMATOLOGY-ONCOLOGY | Admitting: HEMATOLOGY-ONCOLOGY

## 2022-06-14 ENCOUNTER — Encounter (HOSPITAL_COMMUNITY): Payer: Self-pay | Admitting: HEMATOLOGY-ONCOLOGY

## 2022-06-14 ENCOUNTER — Encounter (HOSPITAL_COMMUNITY): Payer: Self-pay

## 2022-06-14 ENCOUNTER — Other Ambulatory Visit: Payer: Self-pay

## 2022-06-14 VITALS — BP 118/90 | HR 90 | Temp 97.8°F | Resp 18 | Wt 278.1 lb

## 2022-06-14 DIAGNOSIS — C50411 Malignant neoplasm of upper-outer quadrant of right female breast: Secondary | ICD-10-CM | POA: Insufficient documentation

## 2022-06-14 DIAGNOSIS — Z171 Estrogen receptor negative status [ER-]: Secondary | ICD-10-CM | POA: Insufficient documentation

## 2022-06-14 DIAGNOSIS — Z5111 Encounter for antineoplastic chemotherapy: Secondary | ICD-10-CM | POA: Insufficient documentation

## 2022-06-14 LAB — CBC WITH DIFF
BASOPHIL #: 0 10*3/uL (ref 0.00–0.10)
BASOPHIL %: 1 % (ref 0–1)
EOSINOPHIL #: 0 10*3/uL (ref 0.00–0.50)
EOSINOPHIL %: 1 %
HCT: 34 % (ref 31.2–41.9)
HGB: 11.5 g/dL (ref 10.9–14.3)
LYMPHOCYTE #: 1.9 10*3/uL (ref 1.00–3.00)
LYMPHOCYTE %: 42 % (ref 16–44)
MCH: 29.4 pg (ref 24.7–32.8)
MCHC: 33.8 g/dL (ref 32.3–35.6)
MCV: 86.9 fL (ref 75.5–95.3)
MONOCYTE #: 0.3 10*3/uL (ref 0.30–1.00)
MONOCYTE %: 6 % (ref 5–13)
MPV: 7.3 fL — ABNORMAL LOW (ref 7.9–10.8)
NEUTROPHIL #: 2.4 10*3/uL (ref 1.85–7.80)
NEUTROPHIL %: 51 % (ref 43–77)
PLATELETS: 283 10*3/uL (ref 140–440)
RBC: 3.92 10*6/uL (ref 3.63–4.92)
RDW: 14.1 % (ref 12.3–17.7)
WBC: 4.7 10*3/uL (ref 3.8–11.8)

## 2022-06-14 MED ORDER — FAMOTIDINE (PF) 20 MG/2 ML INTRAVENOUS SOLUTION
20.0000 mg | Freq: Once | INTRAVENOUS | Status: DC | PRN
Start: 2022-06-14 — End: 2022-06-15

## 2022-06-14 MED ORDER — HYDROCORTISONE SOD SUCCINATE 100 MG/2 ML VIAL WRAPPER
100.0000 mg | Freq: Once | INTRAMUSCULAR | Status: DC | PRN
Start: 2022-06-14 — End: 2022-06-15

## 2022-06-14 MED ORDER — FAMOTIDINE (PF) 20 MG/2 ML INTRAVENOUS SOLUTION
20.0000 mg | Freq: Once | INTRAVENOUS | Status: AC
Start: 2022-06-14 — End: 2022-06-14
  Administered 2022-06-14: 20 mg via INTRAVENOUS

## 2022-06-14 MED ORDER — DIPHENHYDRAMINE 50 MG/ML INJECTION SOLUTION
25.0000 mg | Freq: Once | INTRAMUSCULAR | Status: AC
Start: 2022-06-14 — End: 2022-06-14
  Administered 2022-06-14: 25 mg via INTRAVENOUS

## 2022-06-14 MED ORDER — SODIUM CHLORIDE 0.9 % INTRAVENOUS SOLUTION
80.0000 mg/m2 | Freq: Once | INTRAVENOUS | Status: AC
Start: 2022-06-14 — End: 2022-06-14
  Administered 2022-06-14: 0 mg via INTRAVENOUS
  Administered 2022-06-14: 189 mg via INTRAVENOUS
  Filled 2022-06-14: qty 31.5

## 2022-06-14 MED ORDER — MEPERIDINE (PF) 25 MG/ML INJECTION SOLUTION
12.5000 mg | Freq: Once | INTRAMUSCULAR | Status: DC | PRN
Start: 2022-06-14 — End: 2022-06-15

## 2022-06-14 MED ORDER — ALBUTEROL SULFATE 2.5 MG/3 ML (0.083 %) SOLUTION FOR NEBULIZATION
2.5000 mg | INHALATION_SOLUTION | Freq: Once | RESPIRATORY_TRACT | Status: DC | PRN
Start: 2022-06-14 — End: 2022-06-15

## 2022-06-14 MED ORDER — ONDANSETRON HCL (PF) 4 MG/2 ML INJECTION SOLUTION
8.0000 mg | Freq: Once | INTRAMUSCULAR | Status: AC
Start: 2022-06-14 — End: 2022-06-14
  Administered 2022-06-14: 8 mg via INTRAVENOUS

## 2022-06-14 MED ORDER — PREDNISONE 10 MG TABLET
ORAL_TABLET | ORAL | Status: AC
Start: 2022-06-14 — End: 2022-06-14
  Filled 2022-06-14: qty 1

## 2022-06-14 MED ORDER — ALBUTEROL SULFATE HFA 90 MCG/ACTUATION AEROSOL INHALER - RN
2.0000 | Freq: Once | RESPIRATORY_TRACT | Status: DC | PRN
Start: 2022-06-14 — End: 2022-06-15

## 2022-06-14 MED ORDER — ONDANSETRON HCL (PF) 4 MG/2 ML INJECTION SOLUTION
INTRAMUSCULAR | Status: AC
Start: 2022-06-14 — End: 2022-06-14
  Filled 2022-06-14: qty 4

## 2022-06-14 MED ORDER — PREDNISONE 10 MG TABLET
10.0000 mg | ORAL_TABLET | Freq: Once | ORAL | Status: AC
Start: 2022-06-14 — End: 2022-06-14
  Administered 2022-06-14: 10 mg via ORAL

## 2022-06-14 MED ORDER — FAMOTIDINE (PF) 20 MG/2 ML INTRAVENOUS SOLUTION
INTRAVENOUS | Status: AC
Start: 2022-06-14 — End: 2022-06-14
  Filled 2022-06-14: qty 2

## 2022-06-14 MED ORDER — EPINEPHRINE HCL (PF) 1 MG/ML (1 ML) INJECTION SOLUTION
0.3000 mg | Freq: Once | INTRAMUSCULAR | Status: DC | PRN
Start: 2022-06-14 — End: 2022-06-15

## 2022-06-14 MED ORDER — DIPHENHYDRAMINE 50 MG/ML INJECTION SOLUTION
25.0000 mg | Freq: Once | INTRAMUSCULAR | Status: DC | PRN
Start: 2022-06-14 — End: 2022-06-15

## 2022-06-14 MED ORDER — DIPHENHYDRAMINE 50 MG/ML INJECTION SOLUTION
INTRAMUSCULAR | Status: AC
Start: 2022-06-14 — End: 2022-06-14
  Filled 2022-06-14: qty 1

## 2022-06-14 MED ORDER — DIPHENHYDRAMINE 50 MG/ML INJECTION SOLUTION
50.0000 mg | Freq: Once | INTRAMUSCULAR | Status: DC | PRN
Start: 2022-06-14 — End: 2022-06-15

## 2022-06-14 NOTE — Nurses Notes (Signed)
1100 - Patient ambulatory to room for treatment today. Assessment complete. Lungs clear. Abdomen soft and non-tender, bowel sounds present. No edema noted. Patient continues to complain of some abdominal cramping that is managed with PRN Bentyl. [facility], RN  Patient Assessment/Symptom Management Patient Has No MD Appointment Today   Key: (+) Symptom present           (-)  Symptom not present If Symptom is Positive(+) a Nursing Note is required   Edema -   Uncontrolled Nausea -   Vomiting -   Inability to eat/drink -   Mouth Sores -   Diarrhea -   Constipation (? Last BM) -   Fatigue that interferes with ADL's -   Numbness/Tingling -change -   Other -   Fever/Signs & Symptoms of infection -   Nurse Initials JD   1120 - PAC accessed at this time. Good blood return noted. [facility], RN  [phone] - Labs reviewed and are good for treatment. Orders released to pharmacy. [facility], RN  [phone] - Premedications Zofran, Pepcid, Benadryl, and Prednisone given. [facility], RN  [phone] - Taxol infusion started. [facility], RN  [phone] - Taxol infusion complete. Line flushing. [facility], RN  1345 - PAC flushed with 63m NS and deaccessed. Gauze dressing and adhesive bandage applied. JShepard General RN  [phone]- Patient left unit via ambulation with husband at this time. JShepard General RN

## 2022-06-14 NOTE — Progress Notes (Signed)
I spoke with patient in infusion, patient requested FMLA forms for spouse to assist with care at home after chemotherapy. She has requested we e-mail form to her at [email or web address]. She is also requesting to speak with someone regarding the Breast Medicaid application. Marzetta Board is going to work on [employer] forms and after signed by provider she will e-mail to patient, patient notified. Angela Nevin spoke with patient about the Breast Medicaid application.

## 2022-06-21 ENCOUNTER — Ambulatory Visit
Admission: RE | Admit: 2022-06-21 | Discharge: 2022-06-21 | Disposition: A | Discharge status: Home / Self Care | Payer: Commercial Managed Care - PPO | Source: Ambulatory Visit | Attending: HEMATOLOGY-ONCOLOGY | Admitting: HEMATOLOGY-ONCOLOGY

## 2022-06-21 ENCOUNTER — Other Ambulatory Visit: Payer: Self-pay

## 2022-06-21 ENCOUNTER — Ambulatory Visit (HOSPITAL_COMMUNITY)
Admission: RE | Admit: 2022-06-21 | Discharge: 2022-06-21 | Disposition: A | Discharge status: Home / Self Care | Payer: Commercial Managed Care - PPO | Source: Ambulatory Visit | Attending: HEMATOLOGY-ONCOLOGY | Admitting: HEMATOLOGY-ONCOLOGY

## 2022-06-21 ENCOUNTER — Ambulatory Visit: Payer: Commercial Managed Care - PPO | Admitting: HEMATOLOGY-ONCOLOGY

## 2022-06-21 ENCOUNTER — Encounter (HOSPITAL_COMMUNITY): Payer: Self-pay | Admitting: HEMATOLOGY-ONCOLOGY

## 2022-06-21 ENCOUNTER — Telehealth (RURAL_HEALTH_CENTER): Payer: Self-pay | Admitting: Family

## 2022-06-21 ENCOUNTER — Other Ambulatory Visit (HOSPITAL_COMMUNITY): Payer: Self-pay | Admitting: HEMATOLOGY-ONCOLOGY

## 2022-06-21 ENCOUNTER — Encounter (HOSPITAL_COMMUNITY): Payer: Self-pay

## 2022-06-21 VITALS — BP 143/99 | HR 105 | Temp 96.1°F | Ht 65.0 in | Wt 272.0 lb

## 2022-06-21 VITALS — BP 134/75 | HR 104 | Temp 97.8°F | Resp 18

## 2022-06-21 DIAGNOSIS — Z5111 Encounter for antineoplastic chemotherapy: Secondary | ICD-10-CM | POA: Insufficient documentation

## 2022-06-21 DIAGNOSIS — C50411 Malignant neoplasm of upper-outer quadrant of right female breast: Secondary | ICD-10-CM

## 2022-06-21 DIAGNOSIS — E039 Hypothyroidism, unspecified: Secondary | ICD-10-CM

## 2022-06-21 DIAGNOSIS — Z171 Estrogen receptor negative status [ER-]: Secondary | ICD-10-CM | POA: Insufficient documentation

## 2022-06-21 DIAGNOSIS — R5383 Other fatigue: Secondary | ICD-10-CM | POA: Insufficient documentation

## 2022-06-21 LAB — COMPREHENSIVE METABOLIC PANEL, NON-FASTING
ALBUMIN/GLOBULIN RATIO: 1.4 (ref 0.8–1.4)
ALBUMIN: 4.3 g/dL (ref 3.5–5.7)
ALKALINE PHOSPHATASE: 96 U/L (ref 34–104)
ALT (SGPT): 27 U/L (ref 7–52)
ANION GAP: 7 mmol/L (ref 4–13)
AST (SGOT): 21 U/L (ref 13–39)
BILIRUBIN TOTAL: 0.4 mg/dL (ref 0.3–1.2)
BUN/CREA RATIO: 18 (ref 6–22)
BUN: 15 mg/dL (ref 7–25)
CALCIUM, CORRECTED: 8.9 mg/dL (ref 8.9–10.8)
CALCIUM: 9.1 mg/dL (ref 8.6–10.3)
CHLORIDE: 102 mmol/L (ref 98–107)
CO2 TOTAL: 30 mmol/L (ref 21–31)
CREATININE: 0.82 mg/dL (ref 0.60–1.30)
ESTIMATED GFR: 89 mL/min/{1.73_m2} (ref >59)
GLOBULIN: 3 (ref 2.9–5.4)
GLUCOSE: 111 mg/dL — ABNORMAL HIGH (ref 74–109)
OSMOLALITY, CALCULATED: 279 mOsm/kg (ref 270–290)
POTASSIUM: 3.3 mmol/L — ABNORMAL LOW (ref 3.5–5.1)
PROTEIN TOTAL: 7.3 g/dL (ref 6.4–8.9)
SODIUM: 139 mmol/L (ref 136–145)

## 2022-06-21 LAB — THYROID STIMULATING HORMONE WITH FREE T4 REFLEX: TSH: 8.053 u[IU]/mL — ABNORMAL HIGH (ref 0.450–5.330)

## 2022-06-21 LAB — CBC WITH DIFF
BASOPHIL #: 0.1 10*3/uL (ref 0.00–0.10)
BASOPHIL %: 1 % (ref 0–1)
EOSINOPHIL #: 0 10*3/uL (ref 0.00–0.50)
EOSINOPHIL %: 1 %
HCT: 33.9 % (ref 31.2–41.9)
HGB: 11.5 g/dL (ref 10.9–14.3)
LYMPHOCYTE #: 1.7 10*3/uL (ref 1.00–3.00)
LYMPHOCYTE %: 30 % (ref 16–44)
MCH: 29.6 pg (ref 24.7–32.8)
MCHC: 34 g/dL (ref 32.3–35.6)
MCV: 87.1 fL (ref 75.5–95.3)
MONOCYTE #: 0.3 10*3/uL (ref 0.30–1.00)
MONOCYTE %: 4 % — ABNORMAL LOW (ref 5–13)
MPV: 7.9 fL (ref 7.9–10.8)
NEUTROPHIL #: 3.8 10*3/uL (ref 1.85–7.80)
NEUTROPHIL %: 64 % (ref 43–77)
PLATELETS: 150 10*3/uL (ref 140–440)
RBC: 3.89 10*6/uL (ref 3.63–4.92)
RDW: 15.6 % (ref 12.3–17.7)
WBC: 5.9 10*3/uL (ref 3.8–11.8)

## 2022-06-21 LAB — MAGNESIUM: MAGNESIUM: 1.5 mg/dL — ABNORMAL LOW (ref 1.9–2.7)

## 2022-06-21 MED ORDER — MEPERIDINE (PF) 25 MG/ML INJECTION SOLUTION
12.5000 mg | Freq: Once | INTRAMUSCULAR | Status: DC | PRN
Start: 2022-06-21 — End: 2022-06-22

## 2022-06-21 MED ORDER — SODIUM CHLORIDE 0.9 % INTRAVENOUS SOLUTION
150.0000 mg | Freq: Once | INTRAVENOUS | Status: AC
Start: 2022-06-21 — End: 2022-06-21
  Administered 2022-06-21: 150 mg via INTRAVENOUS
  Administered 2022-06-21: 0 mg via INTRAVENOUS
  Filled 2022-06-21: qty 5

## 2022-06-21 MED ORDER — DIPHENHYDRAMINE 50 MG/ML INJECTION SOLUTION
25.0000 mg | Freq: Once | INTRAMUSCULAR | Status: AC
Start: 2022-06-21 — End: 2022-06-21
  Administered 2022-06-21: 25 mg via INTRAVENOUS

## 2022-06-21 MED ORDER — DEXTROSE 5% IN WATER (D5W) FLUSH BAG - 250 ML
INTRAVENOUS | Status: AC | PRN
Start: 2022-06-21 — End: ?

## 2022-06-21 MED ORDER — OLANZAPINE 5 MG TABLET
ORAL_TABLET | ORAL | Status: AC
Start: 2022-06-21 — End: 2022-06-21
  Filled 2022-06-21: qty 1

## 2022-06-21 MED ORDER — LORAZEPAM 0.5 MG TABLET
0.5000 mg | ORAL_TABLET | Freq: Four times a day (QID) | ORAL | Status: DC | PRN
Start: 2022-06-21 — End: 2022-06-22

## 2022-06-21 MED ORDER — HYDROCORTISONE SOD SUCCINATE 100 MG/2 ML VIAL WRAPPER
100.0000 mg | Freq: Once | INTRAMUSCULAR | Status: DC | PRN
Start: 2022-06-21 — End: 2022-06-22

## 2022-06-21 MED ORDER — FAMOTIDINE (PF) 20 MG/2 ML INTRAVENOUS SOLUTION
20.0000 mg | Freq: Once | INTRAVENOUS | Status: AC
Start: 2022-06-21 — End: 2022-06-21
  Administered 2022-06-21: 20 mg via INTRAVENOUS

## 2022-06-21 MED ORDER — PREDNISONE 10 MG TABLET
10.0000 mg | ORAL_TABLET | Freq: Once | ORAL | Status: AC
Start: 2022-06-21 — End: 2022-06-21
  Administered 2022-06-21: 10 mg via ORAL

## 2022-06-21 MED ORDER — PALONOSETRON 0.25 MG/5 ML INTRAVENOUS SOLUTION
0.2500 mg | Freq: Once | INTRAVENOUS | Status: AC
Start: 2022-06-21 — End: 2022-06-21
  Administered 2022-06-21: 0.25 mg via INTRAVENOUS

## 2022-06-21 MED ORDER — POTASSIUM CHLORIDE ER 20 MEQ TABLET,EXTENDED RELEASE(PART/CRYST)
20.0000 meq | ORAL_TABLET | Freq: Every day | ORAL | 5 refills | Status: AC
Start: 2022-06-21 — End: ?

## 2022-06-21 MED ORDER — MAGNESIUM SULFATE 1 GRAM/100 ML IN DEXTROSE 5 % INTRAVENOUS PIGGYBACK
1.0000 g | INJECTION | INTRAVENOUS | Status: AC
Start: 2022-06-21 — End: 2022-06-21
  Administered 2022-06-21: 1 g via INTRAVENOUS
  Administered 2022-06-21 (×2): 0 g via INTRAVENOUS
  Administered 2022-06-21: 1 g via INTRAVENOUS

## 2022-06-21 MED ORDER — SODIUM CHLORIDE 0.9 % INTRAVENOUS SOLUTION
684.0000 mg | Freq: Once | INTRAVENOUS | Status: AC
Start: 2022-06-21 — End: 2022-06-21
  Administered 2022-06-21: 0 mg via INTRAVENOUS
  Administered 2022-06-21: 685 mg via INTRAVENOUS
  Filled 2022-06-21: qty 45

## 2022-06-21 MED ORDER — DIPHENHYDRAMINE 50 MG/ML INJECTION SOLUTION
25.0000 mg | Freq: Once | INTRAMUSCULAR | Status: DC | PRN
Start: 2022-06-21 — End: 2022-06-22

## 2022-06-21 MED ORDER — SODIUM CHLORIDE 0.9 % IV BOLUS
1000.0000 mL | INJECTION | Freq: Once | Status: AC
Start: 2022-06-21 — End: 2022-06-21
  Administered 2022-06-21: 1000 mL via INTRAVENOUS
  Administered 2022-06-21: 0 mL via INTRAVENOUS

## 2022-06-21 MED ORDER — DIPHENHYDRAMINE 50 MG/ML INJECTION SOLUTION
INTRAMUSCULAR | Status: AC
Start: 2022-06-21 — End: 2022-06-21
  Filled 2022-06-21: qty 1

## 2022-06-21 MED ORDER — MAGNESIUM SULFATE 1 GRAM/100 ML IN DEXTROSE 5 % INTRAVENOUS PIGGYBACK
INJECTION | INTRAVENOUS | Status: AC
Start: 2022-06-21 — End: 2022-06-21
  Filled 2022-06-21: qty 200

## 2022-06-21 MED ORDER — PROCHLORPERAZINE EDISYLATE 10 MG/2 ML (5 MG/ML) INJECTION SOLUTION
10.0000 mg | Freq: Four times a day (QID) | INTRAMUSCULAR | Status: DC | PRN
Start: 2022-06-21 — End: 2022-06-22

## 2022-06-21 MED ORDER — EPINEPHRINE HCL (PF) 1 MG/ML (1 ML) INJECTION SOLUTION
0.3000 mg | Freq: Once | INTRAMUSCULAR | Status: DC | PRN
Start: 2022-06-21 — End: 2022-06-22

## 2022-06-21 MED ORDER — SODIUM CHLORIDE 0.9 % INTRAVENOUS SOLUTION
80.0000 mg/m2 | Freq: Once | INTRAVENOUS | Status: AC
Start: 2022-06-21 — End: 2022-06-21
  Administered 2022-06-21: 189 mg via INTRAVENOUS
  Administered 2022-06-21: 0 mg via INTRAVENOUS
  Filled 2022-06-21: qty 31.5

## 2022-06-21 MED ORDER — PALONOSETRON 0.25 MG/5 ML INTRAVENOUS SOLUTION
INTRAVENOUS | Status: AC
Start: 2022-06-21 — End: 2022-06-21
  Filled 2022-06-21: qty 5

## 2022-06-21 MED ORDER — OLANZAPINE 5 MG TABLET
5.0000 mg | ORAL_TABLET | Freq: Once | ORAL | Status: AC
Start: 2022-06-21 — End: 2022-06-21
  Administered 2022-06-21: 5 mg via ORAL

## 2022-06-21 MED ORDER — FAMOTIDINE (PF) 20 MG/2 ML INTRAVENOUS SOLUTION
INTRAVENOUS | Status: AC
Start: 2022-06-21 — End: 2022-06-21
  Filled 2022-06-21: qty 2

## 2022-06-21 MED ORDER — SODIUM CHLORIDE 0.9 % INTRAVENOUS SOLUTION
200.0000 mg | Freq: Once | INTRAVENOUS | Status: AC
Start: 2022-06-21 — End: 2022-06-21
  Administered 2022-06-21: 200 mg via INTRAVENOUS
  Administered 2022-06-21: 0 mg via INTRAVENOUS
  Filled 2022-06-21: qty 8

## 2022-06-21 MED ORDER — ALBUTEROL SULFATE HFA 90 MCG/ACTUATION AEROSOL INHALER - RN
2.0000 | Freq: Once | RESPIRATORY_TRACT | Status: DC | PRN
Start: 2022-06-21 — End: 2022-06-22

## 2022-06-21 MED ORDER — ALBUTEROL SULFATE 2.5 MG/3 ML (0.083 %) SOLUTION FOR NEBULIZATION
2.5000 mg | INHALATION_SOLUTION | Freq: Once | RESPIRATORY_TRACT | Status: DC | PRN
Start: 2022-06-21 — End: 2022-06-22

## 2022-06-21 MED ORDER — PROCHLORPERAZINE MALEATE 10 MG TABLET
10.0000 mg | ORAL_TABLET | Freq: Four times a day (QID) | ORAL | Status: DC | PRN
Start: 2022-06-21 — End: 2022-06-22

## 2022-06-21 MED ORDER — DIPHENHYDRAMINE 50 MG/ML INJECTION SOLUTION
50.0000 mg | Freq: Once | INTRAMUSCULAR | Status: DC | PRN
Start: 2022-06-21 — End: 2022-06-22

## 2022-06-21 MED ORDER — LORAZEPAM 2 MG/ML INJECTION WRAPPER
0.5000 mg | Freq: Four times a day (QID) | INTRAMUSCULAR | Status: DC | PRN
Start: 2022-06-21 — End: 2022-06-22

## 2022-06-21 MED ORDER — FAMOTIDINE (PF) 20 MG/2 ML INTRAVENOUS SOLUTION
20.0000 mg | Freq: Once | INTRAVENOUS | Status: DC | PRN
Start: 2022-06-21 — End: 2022-06-22

## 2022-06-21 MED ORDER — PREDNISONE 10 MG TABLET
ORAL_TABLET | ORAL | Status: AC
Start: 2022-06-21 — End: 2022-06-21
  Filled 2022-06-21: qty 1

## 2022-06-21 MED ORDER — SODIUM CHLORIDE 0.9% FLUSH BAG - 250 ML
INTRAVENOUS | Status: AC | PRN
Start: 2022-06-21 — End: ?

## 2022-06-21 NOTE — Nurses Notes (Signed)
0900 - PAC accessed and labs drawn from port. [facility], RN  [phone] - Patient ambulatory to room following clinic appointment. Assessment complete. Lungs clear. Abdomen soft and non-tender, bowel sounds present. Generalized edema noted. Patient complains of pain to her hand, hip, and abdomen that has been ongoing. Labs reviewed and are good for treatment, Orders released to pharmacy. Secure chat sent to Dr. Jake Shark regarding Magnesium of 1.5 and Potassium of 3.3.  [facility], RN  Patient Assessment/Symptom Management Patient Has MD Appointment Today   Key: (+) Symptom present           (-)  Symptom not present If Symptom is Positive(+) a Nursing Note is required   Edema -   Uncontrolled Nausea -   Vomiting -   Inability to eat/drink -   Mouth Sores -   Diarrhea -   Constipation (? Last BM) -   Fatigue that interferes with ADL's -   Numbness/Tingling -change -   Other -   Fever/Signs & Symptoms of infection -   Nurse Initials JD   [phone] - [phone] - Premedications Zyprexa, Prednisone, Benadryl, Pepcid, and Aloxi given. [facility], RN  [phone] - NS 1 liter bolus started. [facility], RN  [phone] - Emend infusion started. [facility], RN  [phone]  - Orders entered for 2 grams of Magnesium IV and Dr. Jake Shark is sending a prescription for Potassium. Patient is aware. [facility], RN  [phone] - Emend infusion complete. Line flushing. [facility], RN  [phone] - Keytruda infusion started. [facility], RN  1059 - NS 1 liter bolus complete. [facility], RN  [phone] - Keytruda infusion complete. Line flushing. [facility], RN  [phone] - Taxol infusion started. [facility], RN  [phone] - 1st Magnesium rider started. [facility], RN  [phone] - 1st Magnesium rider complete. [facility], RN  [phone] - 2nd Magnesium rider started. [facility], RN  [phone] - Taxol infusion complete. Line flushing. [facility], RN  [phone] - Carboplatin infusion started. [facility], RN  [phone] - 2nd Magnesium rider complete. [facility], RN  [phone] - Carboplatin infusion  complete. Line flushing. Patient tolerated well. [facility], RN  1355 - PAC flushed with 52ml NS and deaccessed. Gauze dressing and adhesive bandage applied. [facility], RN  [phone] - Patient left unit via ambulation with husband. [facility], RN

## 2022-06-21 NOTE — Progress Notes (Unsigned)
Department of Hematology/Oncology  History and Physical    Name: Rachel Mendoza  [id number]  Date of Birth: [birth year]  Encounter Date: 06/21/2022    TELEMEDICINE DOCUMENTATION:  Patient Location:  [facility], [facility] outpatient Hematology/Oncology [address] [place]  [address]  Patient/family aware of provider location:  yes  Patient/family consent for telemedicine:  yes    REFERRING PROVIDER:  No referring provider defined for this encounter.    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    HISTORY OF PRESENT ILLNESS:  Rachel Mendoza is a 47 y.o. female who presents today for initial medical oncology consultation regarding triple negative breast cancer.  The patient discovered a lump which was evaluated with mammogram, ultrasound, and biopsy.  The primary lesion measures in the range of 2.2-2.9 cm, and there were some nearby lymph nodes that were described as prominent although not obviously involved.  The narrow measurement for the lymph nodes was in the range of 1.1 cm.    The biopsy showed invasive ductal malignancy that was triple negative.  BRCA testing is currently pending.  Based on the above information, she was referred for consideration of neoadjuvant chemotherapy.    05/10/2022: The patient is here for follow up of triple negative breast cancer.  We still do not have the BRCA result.  We have also been trying to obtain a pretreatment PET-CT, but have not been able to obtain approval for it yet.    05/31/2022: The patient is here for follow up of triple negative breast cancer.  She states that she has noticed a dramatic improvement in the size of the lesion.  She states that she struggles to palpate it now.    06/21/2022: The patient is here for follow up of triple negative breast cancer.  She states that she is having fatigue and various other issues related to treatment.    ROS:   Review of Systems    Constitutional:  Negative for appetite change, chills and fatigue.   HENT:   Negative for sore throat and trouble swallowing.    Eyes:  Negative for eye problems.   Respiratory:  Negative for cough and shortness of breath.    Cardiovascular:  Negative for chest pain and leg swelling.   Gastrointestinal:  Negative for abdominal pain.   Genitourinary:  Negative for dysuria and hematuria.    Musculoskeletal:  Negative for arthralgias and gait problem.   Skin:  Negative for rash.   Neurological:  Negative for gait problem.   Hematological:  Negative for adenopathy.   Psychiatric/Behavioral:  Negative for depression.         HISTORY:  Past Medical History:   Diagnosis Date    Allergic rhinitis     Anal fissure     Asthma     Bradycardia     Depression     Esophageal reflux     Hx of breast cancer     Hypersomnia     Hypertension     Hypothyroidism     Insomnia, unspecified     Kidney stones 06/06/2021    Nerve damage     Obesity, unspecified     Sleep apnea     Tachycardia, unspecified          Past Surgical History:   Procedure Laterality Date    HX BACK SURGERY      HX BREAST BIOPSY Right     HX HAND SURGERY  Left     left thumb    HX HYSTERECTOMY      HX LITHOTRIPSY      HX TONSILLECTOMY      HX TUBAL LIGATION Bilateral     PORTACATH PLACEMENT           Social History     Socioeconomic History    Marital status: Married     Spouse name: Not on file    Number of children: Not on file    Years of education: Not on file    Highest education level: Not on file   Occupational History    Not on file   Tobacco Use    Smoking status: Every Day     Current packs/day: 0.50     Average packs/day: 0.5 packs/day for 36.2 years (18.1 ttl pk-yrs)     Types: Cigarettes     Start date: 04/08/1986     Passive exposure: Never    Smokeless tobacco: Never   Vaping Use    Vaping status: Never Used   Substance and Sexual Activity    Alcohol use: Not Currently     Comment: occasionally    Drug use: Yes     Types: Marijuana     Comment: CBD  Gummies    Sexual activity: Not on file   Other Topics Concern    Not on file   Social History Narrative    Not on file     Social Determinants of Health     Financial Resource Strain: Not on file   Transportation Needs: Not on file   Social Connections: Not on file   Intimate Partner Violence: Not on file   Housing Stability: Not on file     Family Medical History:       Problem Relation (Age of Onset)    Asthma Brother, Maternal Grandmother    Breast Cancer Paternal Grandmother    Diabetes type II Father, Paternal Grandmother    Elevated Lipids Father    Heart Disease Father    Hypertension (High Blood Pressure) Mother, Father    No Known Problems Sister, Maternal Aunt, Maternal Uncle, Paternal 31, Paternal Uncle, Maternal Grandfather, Paternal Grandfather, Daughter, Son, Other            Current Outpatient Medications   Medication Sig    albuterol sulfate (PROVENTIL OR VENTOLIN OR PROAIR) 90 mcg/actuation Inhalation oral inhaler Take 1-2 Puffs by inhalation Every 6 hours as needed    beclomethasone dipropionate (QVAR REDIHALER) 80 mcg/actuation Inhalation oral inhaler Take 1 Puff by inhalation Twice daily    dicyclomine (BENTYL) 20 mg Oral Tablet Take 1 Tablet (20 mg total) by mouth Four times a day    dilTIAZem (CARDIZEM CD) 120 mg Oral Capsule, Sust. Release 24 hr Take 1 Capsule (120 mg total) by mouth Once a day    gabapentin (NEURONTIN) 300 mg Oral Capsule Take 1 Capsule (300 mg total) by mouth Four times a day    hydroCHLOROthiazide (HYDRODIURIL) 25 mg Oral Tablet Take 1 Tablet (25 mg total) by mouth Once a day    levothyroxine (SYNTHROID) 125 mcg Oral Tablet Take 1 Tablet (125 mcg total) by mouth Once a day    LIDOCAINE VISCOUS 2 % Mucous Membrane Solution Once per day as needed    loratadine (CLARITIN) 10 mg Oral Tablet Take 1 Tablet (10 mg total) by mouth Once a day    losartan (COZAAR) 50 mg Oral Tablet Take 1 Tablet (50 mg total) by  mouth Once a day    Magic Mouthwash Swish and spit 10 mL Every 2  hours as needed for Sore throat Contains lidocaine viscous 2%, diphenhydramine 12.5 mg/5 ml, Maalox. Mix 1:1:1    miconazole nitrate (SECURA) 2 % Cream Apply topically Twice daily    montelukast (SINGULAIR) 10 mg Oral Tablet Take 1 Tablet (10 mg total) by mouth Once a day    nystatin (MYCOSTATIN) 100,000 unit/mL Oral Suspension Take 5 mL by mouth Four times a day    omeprazole (PRILOSEC) 20 mg Oral Capsule, Delayed Release(E.C.) Take 1 Capsule (20 mg total) by mouth Once a day    ondansetron (ZOFRAN) 8 mg Oral Tablet Take 1 Tablet (8 mg total) by mouth Every 8 hours as needed for Nausea/Vomiting    prochlorperazine (COMPAZINE) 10 mg Oral Tablet Take 1 Tablet (10 mg total) by mouth Four times a day as needed for Nausea/Vomiting    sertraline (ZOLOFT) 100 mg Oral Tablet Take 0.5 Tablets (50 mg total) by mouth Once a day (Patient taking differently: Take 1 Tablet (100 mg total) by mouth Once a day)     Allergies   Allergen Reactions    Dexamethasone (Pf) Swelling    Tamsulosin Nausea/ Vomiting       PHYSICAL EXAM:  Most Recent Vitals    Flowsheet Row Telemedicine from 04/24/2022 in Hematology/Oncology,   [facility]   Temperature 36.6 C (97.8 F) filed at... 04/24/2022 1338   Heart Rate 104 filed at... 04/24/2022 1338   Respiratory Rate --   BP (Non-Invasive) 147/99 filed at... 04/24/2022 1338   SpO2 96 % filed at... 04/24/2022 1338   Height 1.651 m (5\' 5" ) filed at... 04/24/2022 1338   Weight 121 kg (266 lb 6.4 oz) filed at... 04/24/2022 1338   BMI (Calculated) 44.42 filed at... 04/24/2022 1338   BSA (Calculated) 2.35 filed at... 04/24/2022 1338      ECOG Status: (0) Fully active, able to carry on all predisease performance without restriction   Physical Exam    DIAGNOSTIC DATA:  No results found for this or any previous visit (from the past 17520 hour(s)).    LABS:   CBC  Diff   Lab Results   Component Value Date/Time    WBC 5.9 06/21/2022 09:04 AM    HGB 11.5 06/21/2022 09:04 AM    HCT 33.9  06/21/2022 09:04 AM    PLTCNT 150 06/21/2022 09:04 AM    RBC 3.89 06/21/2022 09:04 AM    MCV 87.1 06/21/2022 09:04 AM    MCHC 34.0 06/21/2022 09:04 AM    MCH 29.6 06/21/2022 09:04 AM    RDW 15.6 06/21/2022 09:04 AM    MPV 7.9 06/21/2022 09:04 AM    Lab Results   Component Value Date/Time    PMNS 64 06/21/2022 09:04 AM    LYMPHOCYTES 30 06/21/2022 09:04 AM    EOSINOPHIL 1 06/21/2022 09:04 AM    MONOCYTES 4 (L) 06/21/2022 09:04 AM    BASOPHILS 1 06/21/2022 09:04 AM    BASOPHILS 0.10 06/21/2022 09:04 AM    PMNABS 3.80 06/21/2022 09:04 AM    LYMPHSABS 1.70 06/21/2022 09:04 AM    EOSABS 0.00 06/21/2022 09:04 AM    MONOSABS 0.30 06/21/2022 09:04 AM            ASSESSMENT:    ICD-10-CM    1. Malignant neoplasm of upper-outer quadrant of right breast in female, estrogen receptor negative (CMS [place])   C50.411     Z17.1  PLAN:   1. All relevant medical records were reviewed including available pertinent provider notes, procedure notes, imaging, laboratory, and pathology.   2. All pertinent labs and/or imaging were reviewed with the patient.   3. Triple negative breast cancer:  We previously reviewed the staging information with the patient, and reviewed the NCCN guidelines.  She is either T2 N0 M0 or T2 N1 M0.  Either one of those would be categorized as stage II.  According to NCCN guidelines, this falls into the high-risk triple negative category. BRCA mutation status is pending. Continue current treatment.     NIRVANA JAWOR was given the chance to ask questions, and these were answered to their satisfaction. The patient is welcome to call with any questions or concerns in the meantime.     On the day of the encounter, a total of 35 minutes was spent on this patient encounter including review of historical information, examination, documentation and post-visit activities.   Return in about 3 weeks (around 07/12/2022).     Narda Rutherford, MD  06/21/2022, 09:35  The patient was seen as part of a collaborative  telemedicine service with Dr. Jake Shark who participated in the encounter by active presence via approved video/audio means for portions of the encounter.  The patient's insurance company bears full legal and financial responsibility resulting from any deviations that they cause to my recommended treatment plan.     CC:  Selena Lesser, FNP-BC  [place] [phone]    No referring provider defined for this encounter.    This note was partially generated using MModal Fluency Direct system, and there may be some incorrect words, spellings, and punctuation that were not noted in checking the note before saving.

## 2022-06-21 NOTE — Telephone Encounter (Signed)
Patient called saying that her thyroid was down and she had the lab repeated. She stated that it was still down so she wants you to change her medication to a different dose. She also asked for a refill on Zoloft 100 mg.

## 2022-06-23 ENCOUNTER — Other Ambulatory Visit (RURAL_HEALTH_CENTER): Payer: Self-pay | Admitting: Family

## 2022-06-23 MED ORDER — SERTRALINE 100 MG TABLET
100.0000 mg | ORAL_TABLET | Freq: Every day | ORAL | 1 refills | Status: DC
Start: 2022-06-23 — End: 2023-01-06

## 2022-06-23 MED ORDER — LEVOTHYROXINE 137 MCG TABLET
137.0000 ug | ORAL_TABLET | Freq: Every morning | ORAL | 1 refills | Status: DC
Start: 2022-06-23 — End: 2022-08-03

## 2022-06-23 NOTE — Telephone Encounter (Signed)
Dose adjusted.  I have placed an order for a repeat TSH to be done in approximately 8 weeks, around May 17.

## 2022-06-24 NOTE — Telephone Encounter (Signed)
Left message for patient to return call to the office.

## 2022-06-24 NOTE — Telephone Encounter (Signed)
Patient was notified.  She said that they check her blood every 21 days when she goes for her chemo treatments.

## 2022-06-28 ENCOUNTER — Encounter (HOSPITAL_COMMUNITY): Payer: Self-pay

## 2022-06-28 ENCOUNTER — Other Ambulatory Visit (HOSPITAL_COMMUNITY): Payer: Self-pay | Admitting: HEMATOLOGY-ONCOLOGY

## 2022-06-28 ENCOUNTER — Ambulatory Visit
Admission: RE | Admit: 2022-06-28 | Discharge: 2022-06-28 | Disposition: A | Discharge status: Home / Self Care | Payer: Commercial Managed Care - PPO | Source: Ambulatory Visit | Attending: HEMATOLOGY-ONCOLOGY | Admitting: HEMATOLOGY-ONCOLOGY

## 2022-06-28 ENCOUNTER — Other Ambulatory Visit (INDEPENDENT_AMBULATORY_CARE_PROVIDER_SITE_OTHER): Payer: Self-pay | Admitting: HEMATOLOGY-ONCOLOGY

## 2022-06-28 ENCOUNTER — Other Ambulatory Visit: Payer: Self-pay

## 2022-06-28 VITALS — BP 125/70 | HR 91 | Temp 99.1°F | Resp 18 | Ht 65.0 in | Wt 273.6 lb

## 2022-06-28 DIAGNOSIS — Z5111 Encounter for antineoplastic chemotherapy: Secondary | ICD-10-CM | POA: Insufficient documentation

## 2022-06-28 DIAGNOSIS — C50411 Malignant neoplasm of upper-outer quadrant of right female breast: Secondary | ICD-10-CM | POA: Insufficient documentation

## 2022-06-28 DIAGNOSIS — Z171 Estrogen receptor negative status [ER-]: Secondary | ICD-10-CM | POA: Insufficient documentation

## 2022-06-28 LAB — CBC WITH DIFF
BASOPHIL #: 0 10*3/uL (ref 0.00–0.10)
BASOPHIL %: 1 % (ref 0–1)
EOSINOPHIL #: 0 10*3/uL (ref 0.00–0.50)
EOSINOPHIL %: 1 %
HCT: 31.5 % (ref 31.2–41.9)
HGB: 10.7 g/dL — ABNORMAL LOW (ref 10.9–14.3)
LYMPHOCYTE #: 1.7 10*3/uL (ref 1.00–3.00)
LYMPHOCYTE %: 41 % (ref 16–44)
MCH: 29.9 pg (ref 24.7–32.8)
MCHC: 34.1 g/dL (ref 32.3–35.6)
MCV: 87.6 fL (ref 75.5–95.3)
MONOCYTE #: 0.1 10*3/uL — ABNORMAL LOW (ref 0.30–1.00)
MONOCYTE %: 3 % — ABNORMAL LOW (ref 5–13)
MPV: 7.6 fL — ABNORMAL LOW (ref 7.9–10.8)
NEUTROPHIL #: 2.3 10*3/uL (ref 1.85–7.80)
NEUTROPHIL %: 54 % (ref 43–77)
PLATELETS: 184 10*3/uL (ref 140–440)
RBC: 3.59 10*6/uL — ABNORMAL LOW (ref 3.63–4.92)
RDW: 16 % (ref 12.3–17.7)
WBC: 4.2 10*3/uL (ref 3.8–11.8)

## 2022-06-28 MED ORDER — DIPHENHYDRAMINE 50 MG/ML INJECTION SOLUTION
25.0000 mg | Freq: Once | INTRAMUSCULAR | Status: DC | PRN
Start: 2022-06-28 — End: 2022-06-29

## 2022-06-28 MED ORDER — PREDNISONE 10 MG TABLET
10.0000 mg | ORAL_TABLET | Freq: Once | ORAL | Status: AC
Start: 2022-06-28 — End: 2022-06-28
  Administered 2022-06-28: 10 mg via ORAL

## 2022-06-28 MED ORDER — SODIUM CHLORIDE 0.9 % INTRAVENOUS SOLUTION
80.0000 mg/m2 | Freq: Once | INTRAVENOUS | Status: AC
Start: 2022-06-28 — End: 2022-06-28
  Administered 2022-06-28: 189 mg via INTRAVENOUS
  Administered 2022-06-28: 0 mg via INTRAVENOUS
  Filled 2022-06-28: qty 31.5

## 2022-06-28 MED ORDER — MAGIC MOUTHWASH
10.0000 mL | ORAL | 0 refills | Status: DC | PRN
Start: 2022-06-28 — End: 2022-06-28

## 2022-06-28 MED ORDER — MEPERIDINE (PF) 25 MG/ML INJECTION SOLUTION
12.5000 mg | Freq: Once | INTRAMUSCULAR | Status: DC | PRN
Start: 2022-06-28 — End: 2022-06-29

## 2022-06-28 MED ORDER — DIPHENHYDRAMINE 50 MG/ML INJECTION SOLUTION
50.0000 mg | Freq: Once | INTRAMUSCULAR | Status: DC | PRN
Start: 2022-06-28 — End: 2022-06-29

## 2022-06-28 MED ORDER — PREDNISONE 10 MG TABLET
ORAL_TABLET | ORAL | Status: AC
Start: 2022-06-28 — End: 2022-06-28
  Filled 2022-06-28: qty 1

## 2022-06-28 MED ORDER — ALBUTEROL SULFATE HFA 90 MCG/ACTUATION AEROSOL INHALER - RN
2.0000 | Freq: Once | RESPIRATORY_TRACT | Status: DC | PRN
Start: 2022-06-28 — End: 2022-06-29

## 2022-06-28 MED ORDER — FAMOTIDINE (PF) 20 MG/2 ML INTRAVENOUS SOLUTION
20.0000 mg | Freq: Once | INTRAVENOUS | Status: AC
Start: 2022-06-28 — End: 2022-06-28
  Administered 2022-06-28: 20 mg via INTRAVENOUS

## 2022-06-28 MED ORDER — FAMOTIDINE (PF) 20 MG/2 ML INTRAVENOUS SOLUTION
INTRAVENOUS | Status: AC
Start: 2022-06-28 — End: 2022-06-28
  Filled 2022-06-28: qty 2

## 2022-06-28 MED ORDER — ONDANSETRON HCL (PF) 4 MG/2 ML INJECTION SOLUTION
8.0000 mg | Freq: Once | INTRAMUSCULAR | Status: AC
Start: 2022-06-28 — End: 2022-06-28
  Administered 2022-06-28: 8 mg via INTRAVENOUS

## 2022-06-28 MED ORDER — ONDANSETRON HCL (PF) 4 MG/2 ML INJECTION SOLUTION
INTRAMUSCULAR | Status: AC
Start: 2022-06-28 — End: 2022-06-28
  Filled 2022-06-28: qty 4

## 2022-06-28 MED ORDER — EPINEPHRINE HCL (PF) 1 MG/ML (1 ML) INJECTION SOLUTION
0.3000 mg | Freq: Once | INTRAMUSCULAR | Status: DC | PRN
Start: 2022-06-28 — End: 2022-06-29

## 2022-06-28 MED ORDER — ALBUTEROL SULFATE 2.5 MG/3 ML (0.083 %) SOLUTION FOR NEBULIZATION
2.5000 mg | INHALATION_SOLUTION | Freq: Once | RESPIRATORY_TRACT | Status: DC | PRN
Start: 2022-06-28 — End: 2022-06-29

## 2022-06-28 MED ORDER — HYDROCORTISONE SOD SUCCINATE 100 MG/2 ML VIAL WRAPPER
100.0000 mg | Freq: Once | INTRAMUSCULAR | Status: DC | PRN
Start: 2022-06-28 — End: 2022-06-29

## 2022-06-28 MED ORDER — DIPHENHYDRAMINE 50 MG/ML INJECTION SOLUTION
INTRAMUSCULAR | Status: AC
Start: 2022-06-28 — End: 2022-06-28
  Filled 2022-06-28: qty 1

## 2022-06-28 MED ORDER — FAMOTIDINE (PF) 20 MG/2 ML INTRAVENOUS SOLUTION
20.0000 mg | Freq: Once | INTRAVENOUS | Status: DC | PRN
Start: 2022-06-28 — End: 2022-06-29

## 2022-06-28 MED ORDER — SODIUM CHLORIDE 0.9 % IV BOLUS
1000.0000 mL | INJECTION | Freq: Once | Status: AC
Start: 2022-06-28 — End: 2022-06-28
  Administered 2022-06-28: 1000 mL via INTRAVENOUS
  Administered 2022-06-28: 0 mL via INTRAVENOUS

## 2022-06-28 MED ORDER — DIPHENHYDRAMINE 50 MG/ML INJECTION SOLUTION
25.0000 mg | Freq: Once | INTRAMUSCULAR | Status: AC
Start: 2022-06-28 — End: 2022-06-28
  Administered 2022-06-28: 25 mg via INTRAVENOUS

## 2022-06-28 NOTE — Nurses Notes (Signed)
0941-Arrived to OP ONC ambulatory and accompanied by spouse.  Here for Taxol infusion.  To room 415. Judithann Sauger, RN  1019--Right upper chest PortACath accessed per protocol.  Excellent blood return noted.  9ml blood wasted.  Labs obtained from port.  Flushed with 30 ml NS without difficulty.  Transparent, non-bordered dressing applied.  Tolerated well.  Labs sent to main hospital lab through tube system. Judithann Sauger, RN   1024-Assessment completed.  Sitting up in bed.  Awake, alert and oriented x 3.  Spouse seated at bedside.  Per pt, has been feeing better for the past two days but this last treatment was the worst.  Verbalizes c/o generalized soreness/achiness, rated 5/10 and fatigue. Voices complaints of intermittent, nausea.  Compazine PO works best, per pt.    HRR.  Lungs clear in all fields and bases.  Abdomen soft and non-tender with active bowel sounds in all four quadrants.  Trace edema noted to bilateral ankles.  No s/s of acute distress noted.  VSS.  Judithann Sauger, RN  1058-Prednisone 10 mg PO and Pepcid 20 mg IVPadministered per premedication order. Judithann Sauger, RN   1102-Benadryl 25 mg IVP administered per premedication order. Judithann Sauger, RN  1139-Taxol infusion started.  Resting quietly with no voiced complaints offered. Judithann Sauger, RN  1239-Taxol infusion complete.  Resting with eyes closed but arouses easily to voice.  Tolerated well. Judithann Sauger, RN  1250-Voiced c/o nausea.  Zofran 8mg  IVP administered per order. Judithann Sauger, RN  1301-Verbalized relief of nausea.  No other voiced complaints offered. Left OP ONC ambulatory and accompanied by spouse.  No other  s/s of acute distress.  VSS. Judithann Sauger, RN

## 2022-07-01 MED ORDER — MAGIC MOUTHWASH
10.0000 mL | ORAL | 0 refills | Status: AC | PRN
Start: 2022-07-01 — End: ?

## 2022-07-05 ENCOUNTER — Other Ambulatory Visit: Payer: Self-pay

## 2022-07-05 ENCOUNTER — Ambulatory Visit
Admission: RE | Admit: 2022-07-05 | Discharge: 2022-07-05 | Disposition: A | Discharge status: Home / Self Care | Payer: Commercial Managed Care - PPO | Source: Ambulatory Visit | Attending: HEMATOLOGY-ONCOLOGY | Admitting: HEMATOLOGY-ONCOLOGY

## 2022-07-05 VITALS — BP 139/98 | HR 92 | Temp 97.5°F | Resp 18

## 2022-07-05 DIAGNOSIS — Z171 Estrogen receptor negative status [ER-]: Secondary | ICD-10-CM | POA: Insufficient documentation

## 2022-07-05 DIAGNOSIS — Z5111 Encounter for antineoplastic chemotherapy: Secondary | ICD-10-CM | POA: Insufficient documentation

## 2022-07-05 DIAGNOSIS — C50411 Malignant neoplasm of upper-outer quadrant of right female breast: Secondary | ICD-10-CM | POA: Insufficient documentation

## 2022-07-05 LAB — CBC WITH DIFF
BASOPHIL #: 0 10*3/uL (ref 0.00–0.10)
BASOPHIL %: 1 % (ref 0–1)
EOSINOPHIL #: 0 10*3/uL (ref 0.00–0.50)
EOSINOPHIL %: 1 %
HCT: 28.7 % — ABNORMAL LOW (ref 31.2–41.9)
HGB: 10.1 g/dL — ABNORMAL LOW (ref 10.9–14.3)
LYMPHOCYTE #: 1.9 10*3/uL (ref 1.00–3.00)
LYMPHOCYTE %: 51 % — ABNORMAL HIGH (ref 16–44)
MCH: 31.1 pg (ref 24.7–32.8)
MCHC: 35.3 g/dL (ref 32.3–35.6)
MCV: 88.2 fL (ref 75.5–95.3)
MONOCYTE #: 0.3 10*3/uL (ref 0.30–1.00)
MONOCYTE %: 9 % (ref 5–13)
MPV: 7.3 fL — ABNORMAL LOW (ref 7.9–10.8)
NEUTROPHIL #: 1.4 10*3/uL — ABNORMAL LOW (ref 1.85–7.80)
NEUTROPHIL %: 38 % — ABNORMAL LOW (ref 43–77)
PLATELETS: 255 10*3/uL (ref 140–440)
RBC: 3.26 10*6/uL — ABNORMAL LOW (ref 3.63–4.92)
RDW: 17.8 % — ABNORMAL HIGH (ref 12.3–17.7)
WBC: 3.6 10*3/uL — ABNORMAL LOW (ref 3.8–11.8)

## 2022-07-05 MED ORDER — FAMOTIDINE (PF) 20 MG/2 ML INTRAVENOUS SOLUTION
20.0000 mg | Freq: Once | INTRAVENOUS | Status: DC | PRN
Start: 2022-07-05 — End: 2022-07-06
  Administered 2022-07-05: 20 mg via INTRAVENOUS

## 2022-07-05 MED ORDER — DIPHENHYDRAMINE 50 MG/ML INJECTION SOLUTION
25.0000 mg | Freq: Once | INTRAMUSCULAR | Status: DC | PRN
Start: 2022-07-05 — End: 2022-07-06

## 2022-07-05 MED ORDER — SODIUM CHLORIDE 0.9 % IV BOLUS
1000.0000 mL | INJECTION | Freq: Once | Status: AC
Start: 2022-07-05 — End: 2022-07-05
  Administered 2022-07-05: 0 mL via INTRAVENOUS
  Administered 2022-07-05: 1000 mL via INTRAVENOUS

## 2022-07-05 MED ORDER — ONDANSETRON HCL (PF) 4 MG/2 ML INJECTION SOLUTION
INTRAMUSCULAR | Status: AC
Start: 2022-07-05 — End: 2022-07-05
  Filled 2022-07-05: qty 4

## 2022-07-05 MED ORDER — SODIUM CHLORIDE 0.9 % INTRAVENOUS SOLUTION
80.0000 mg/m2 | Freq: Once | INTRAVENOUS | Status: AC
Start: 2022-07-05 — End: 2022-07-05
  Administered 2022-07-05: 0 mg via INTRAVENOUS
  Administered 2022-07-05: 189 mg via INTRAVENOUS
  Filled 2022-07-05: qty 31.5

## 2022-07-05 MED ORDER — MEPERIDINE (PF) 25 MG/ML INJECTION SOLUTION
12.5000 mg | Freq: Once | INTRAMUSCULAR | Status: DC | PRN
Start: 2022-07-05 — End: 2022-07-06

## 2022-07-05 MED ORDER — PREDNISONE 10 MG TABLET
ORAL_TABLET | ORAL | Status: AC
Start: 2022-07-05 — End: 2022-07-05
  Filled 2022-07-05: qty 1

## 2022-07-05 MED ORDER — DIPHENHYDRAMINE 50 MG/ML INJECTION SOLUTION
INTRAMUSCULAR | Status: AC
Start: 2022-07-05 — End: 2022-07-05
  Filled 2022-07-05: qty 1

## 2022-07-05 MED ORDER — EPINEPHRINE HCL (PF) 1 MG/ML (1 ML) INJECTION SOLUTION
0.3000 mg | Freq: Once | INTRAMUSCULAR | Status: DC | PRN
Start: 2022-07-05 — End: 2022-07-06

## 2022-07-05 MED ORDER — PREDNISONE 10 MG TABLET
10.0000 mg | ORAL_TABLET | Freq: Once | ORAL | Status: AC
Start: 2022-07-05 — End: 2022-07-05
  Administered 2022-07-05: 10 mg via ORAL

## 2022-07-05 MED ORDER — FAMOTIDINE (PF) 20 MG/2 ML INTRAVENOUS SOLUTION
INTRAVENOUS | Status: AC
Start: 2022-07-05 — End: 2022-07-05
  Filled 2022-07-05: qty 2

## 2022-07-05 MED ORDER — ALBUTEROL SULFATE HFA 90 MCG/ACTUATION AEROSOL INHALER - RN
2.0000 | Freq: Once | RESPIRATORY_TRACT | Status: DC | PRN
Start: 2022-07-05 — End: 2022-07-06

## 2022-07-05 MED ORDER — HYDROCORTISONE SOD SUCCINATE 100 MG/2 ML VIAL WRAPPER
100.0000 mg | Freq: Once | INTRAMUSCULAR | Status: DC | PRN
Start: 2022-07-05 — End: 2022-07-06

## 2022-07-05 MED ORDER — ONDANSETRON HCL (PF) 4 MG/2 ML INJECTION SOLUTION
8.0000 mg | Freq: Once | INTRAMUSCULAR | Status: AC
Start: 2022-07-05 — End: 2022-07-05
  Administered 2022-07-05: 8 mg via INTRAVENOUS

## 2022-07-05 MED ORDER — FAMOTIDINE (PF) 20 MG/2 ML INTRAVENOUS SOLUTION
20.0000 mg | Freq: Once | INTRAVENOUS | Status: AC
Start: 2022-07-05 — End: 2022-07-05
  Administered 2022-07-05: 20 mg via INTRAVENOUS

## 2022-07-05 MED ORDER — DIPHENHYDRAMINE 50 MG/ML INJECTION SOLUTION
50.0000 mg | Freq: Once | INTRAMUSCULAR | Status: DC | PRN
Start: 2022-07-05 — End: 2022-07-06

## 2022-07-05 MED ORDER — ALBUTEROL SULFATE 2.5 MG/3 ML (0.083 %) SOLUTION FOR NEBULIZATION
2.5000 mg | INHALATION_SOLUTION | Freq: Once | RESPIRATORY_TRACT | Status: DC | PRN
Start: 2022-07-05 — End: 2022-07-06

## 2022-07-05 MED ORDER — DIPHENHYDRAMINE 50 MG/ML INJECTION SOLUTION
25.0000 mg | Freq: Once | INTRAMUSCULAR | Status: AC
Start: 2022-07-05 — End: 2022-07-05
  Administered 2022-07-05: 25 mg via INTRAVENOUS

## 2022-07-05 NOTE — Nurses Notes (Addendum)
1000 Arrived to OP ONC ambulatory and in no distress.  Pt to have labs and  Taxol infusion.    1055--Right upper chest PortACath accessed per protocol.  Excellent blood return noted.  82ml blood wasted.  Labs obtained from port.  Flushed with 30 ml NS   1200 NS bolus started   1230 Premeds completed   1330 Taxol infusion started.   1430-Taxol infusion complete.  Pt tolerated it fairly well but is c/o nausea.  Line changed to saline flush  1445 Pepcid given for c/o nausea  1530 nausea relieved. PAC flushed with 21ml of saline and deaccessed    1540 Pt left the unit ambulatory and in no distress

## 2022-07-12 ENCOUNTER — Encounter (HOSPITAL_COMMUNITY): Payer: Self-pay | Admitting: NURSE PRACTITIONER

## 2022-07-12 ENCOUNTER — Ambulatory Visit
Admission: RE | Admit: 2022-07-12 | Discharge: 2022-07-12 | Disposition: A | Discharge status: Home / Self Care | Payer: Commercial Managed Care - PPO | Source: Ambulatory Visit | Attending: HEMATOLOGY-ONCOLOGY | Admitting: HEMATOLOGY-ONCOLOGY

## 2022-07-12 ENCOUNTER — Ambulatory Visit (HOSPITAL_COMMUNITY)
Admission: RE | Admit: 2022-07-12 | Discharge: 2022-07-12 | Disposition: A | Discharge status: Home / Self Care | Payer: Commercial Managed Care - PPO | Source: Ambulatory Visit | Attending: HEMATOLOGY-ONCOLOGY | Admitting: HEMATOLOGY-ONCOLOGY

## 2022-07-12 ENCOUNTER — Other Ambulatory Visit: Payer: Self-pay

## 2022-07-12 ENCOUNTER — Encounter (HOSPITAL_COMMUNITY): Payer: Self-pay | Admitting: HEMATOLOGY-ONCOLOGY

## 2022-07-12 ENCOUNTER — Ambulatory Visit (HOSPITAL_BASED_OUTPATIENT_CLINIC_OR_DEPARTMENT_OTHER): Payer: Commercial Managed Care - PPO | Admitting: NURSE PRACTITIONER

## 2022-07-12 ENCOUNTER — Encounter (HOSPITAL_COMMUNITY): Payer: Self-pay

## 2022-07-12 ENCOUNTER — Other Ambulatory Visit (HOSPITAL_COMMUNITY): Payer: Self-pay | Admitting: HEMATOLOGY-ONCOLOGY

## 2022-07-12 VITALS — BP 117/66 | HR 98 | Temp 96.8°F | Resp 18

## 2022-07-12 VITALS — BP 150/110 | HR 118 | Temp 97.7°F | Ht 65.0 in | Wt 275.6 lb

## 2022-07-12 DIAGNOSIS — C50411 Malignant neoplasm of upper-outer quadrant of right female breast: Secondary | ICD-10-CM

## 2022-07-12 DIAGNOSIS — Z171 Estrogen receptor negative status [ER-]: Secondary | ICD-10-CM | POA: Insufficient documentation

## 2022-07-12 DIAGNOSIS — R112 Nausea with vomiting, unspecified: Secondary | ICD-10-CM

## 2022-07-12 DIAGNOSIS — F17210 Nicotine dependence, cigarettes, uncomplicated: Secondary | ICD-10-CM | POA: Insufficient documentation

## 2022-07-12 DIAGNOSIS — Z5112 Encounter for antineoplastic immunotherapy: Secondary | ICD-10-CM | POA: Insufficient documentation

## 2022-07-12 DIAGNOSIS — Z5111 Encounter for antineoplastic chemotherapy: Secondary | ICD-10-CM | POA: Insufficient documentation

## 2022-07-12 DIAGNOSIS — Z79899 Other long term (current) drug therapy: Secondary | ICD-10-CM | POA: Insufficient documentation

## 2022-07-12 LAB — COMPREHENSIVE METABOLIC PANEL, NON-FASTING
ALBUMIN/GLOBULIN RATIO: 1.2 (ref 0.8–1.4)
ALBUMIN: 4.1 g/dL (ref 3.5–5.7)
ALKALINE PHOSPHATASE: 85 U/L (ref 34–104)
ALT (SGPT): 21 U/L (ref 7–52)
ANION GAP: 7 mmol/L (ref 4–13)
AST (SGOT): 17 U/L (ref 13–39)
BILIRUBIN TOTAL: 0.3 mg/dL (ref 0.3–1.2)
BUN/CREA RATIO: 20 (ref 6–22)
BUN: 18 mg/dL (ref 7–25)
CALCIUM, CORRECTED: 9.1 mg/dL (ref 8.9–10.8)
CALCIUM: 9.2 mg/dL (ref 8.6–10.3)
CHLORIDE: 102 mmol/L (ref 98–107)
CO2 TOTAL: 28 mmol/L (ref 21–31)
CREATININE: 0.89 mg/dL (ref 0.60–1.30)
ESTIMATED GFR: 81 mL/min/{1.73_m2} (ref >59)
GLOBULIN: 3.4 (ref 2.9–5.4)
GLUCOSE: 110 mg/dL — ABNORMAL HIGH (ref 74–109)
OSMOLALITY, CALCULATED: 276 mOsm/kg (ref 270–290)
POTASSIUM: 3.2 mmol/L — ABNORMAL LOW (ref 3.5–5.1)
PROTEIN TOTAL: 7.5 g/dL (ref 6.4–8.9)
SODIUM: 137 mmol/L (ref 136–145)

## 2022-07-12 LAB — CBC WITH DIFF
BASOPHIL #: 0 10*3/uL (ref 0.00–0.10)
BASOPHIL %: 1 % (ref 0–1)
EOSINOPHIL #: 0 10*3/uL (ref 0.00–0.50)
EOSINOPHIL %: 1 %
HCT: 29 % — ABNORMAL LOW (ref 31.2–41.9)
HGB: 10 g/dL — ABNORMAL LOW (ref 10.9–14.3)
LYMPHOCYTE #: 2 10*3/uL (ref 1.00–3.00)
LYMPHOCYTE %: 33 % (ref 16–44)
MCH: 30.7 pg (ref 24.7–32.8)
MCHC: 34.6 g/dL (ref 32.3–35.6)
MCV: 88.9 fL (ref 75.5–95.3)
MONOCYTE #: 0.3 10*3/uL (ref 0.30–1.00)
MONOCYTE %: 4 % — ABNORMAL LOW (ref 5–13)
MPV: 7.6 fL — ABNORMAL LOW (ref 7.9–10.8)
NEUTROPHIL #: 3.7 10*3/uL (ref 1.85–7.80)
NEUTROPHIL %: 61 % (ref 43–77)
PLATELETS: 218 10*3/uL (ref 140–440)
RBC: 3.26 10*6/uL — ABNORMAL LOW (ref 3.63–4.92)
RDW: 20 % — ABNORMAL HIGH (ref 12.3–17.7)
WBC: 6.1 10*3/uL (ref 3.8–11.8)

## 2022-07-12 LAB — THYROID STIMULATING HORMONE WITH FREE T4 REFLEX: TSH: 9.15 u[IU]/mL — ABNORMAL HIGH (ref 0.450–5.330)

## 2022-07-12 LAB — MAGNESIUM: MAGNESIUM: 1.5 mg/dL — ABNORMAL LOW (ref 1.9–2.7)

## 2022-07-12 MED ORDER — PROCHLORPERAZINE EDISYLATE 10 MG/2 ML (5 MG/ML) INJECTION SOLUTION
10.0000 mg | Freq: Four times a day (QID) | INTRAMUSCULAR | Status: DC | PRN
Start: 2022-07-12 — End: 2022-07-13

## 2022-07-12 MED ORDER — POTASSIUM CHLORIDE 20 MEQ/100ML IN STERILE WATER INTRAVENOUS PIGGYBACK
20.0000 meq | INJECTION | Freq: Once | INTRAVENOUS | Status: AC
Start: 2022-07-12 — End: 2022-07-12
  Administered 2022-07-12: 0 meq via INTRAVENOUS
  Administered 2022-07-12: 20 meq via INTRAVENOUS

## 2022-07-12 MED ORDER — FAMOTIDINE (PF) 20 MG/2 ML INTRAVENOUS SOLUTION
20.0000 mg | Freq: Once | INTRAVENOUS | Status: AC
Start: 2022-07-12 — End: 2022-07-12
  Administered 2022-07-12: 20 mg via INTRAVENOUS

## 2022-07-12 MED ORDER — FAMOTIDINE (PF) 20 MG/2 ML INTRAVENOUS SOLUTION
INTRAVENOUS | Status: AC
Start: 2022-07-12 — End: 2022-07-12
  Filled 2022-07-12: qty 2

## 2022-07-12 MED ORDER — DIPHENHYDRAMINE 50 MG/ML INJECTION SOLUTION
25.0000 mg | Freq: Once | INTRAMUSCULAR | Status: AC
Start: 2022-07-12 — End: 2022-07-12
  Administered 2022-07-12: 25 mg via INTRAVENOUS

## 2022-07-12 MED ORDER — EPINEPHRINE HCL (PF) 1 MG/ML (1 ML) INJECTION SOLUTION
0.3000 mg | Freq: Once | INTRAMUSCULAR | Status: DC | PRN
Start: 2022-07-12 — End: 2022-07-13

## 2022-07-12 MED ORDER — MAGNESIUM SULFATE 1 GRAM/100 ML IN DEXTROSE 5 % INTRAVENOUS PIGGYBACK
1.0000 g | INJECTION | INTRAVENOUS | Status: AC
Start: 2022-07-12 — End: 2022-07-12
  Administered 2022-07-12 (×2): 1 g via INTRAVENOUS
  Administered 2022-07-12 (×3): 0 g via INTRAVENOUS
  Administered 2022-07-12: 1 g via INTRAVENOUS
  Administered 2022-07-12: 0 g via INTRAVENOUS
  Administered 2022-07-12: 1 g via INTRAVENOUS

## 2022-07-12 MED ORDER — PROMETHAZINE 25 MG TABLET
25.0000 mg | ORAL_TABLET | Freq: Four times a day (QID) | ORAL | 0 refills | Status: DC | PRN
Start: 2022-07-12 — End: 2022-08-02

## 2022-07-12 MED ORDER — LORAZEPAM 2 MG/ML INJECTION WRAPPER
0.5000 mg | Freq: Four times a day (QID) | INTRAMUSCULAR | Status: DC | PRN
Start: 2022-07-12 — End: 2022-07-13

## 2022-07-12 MED ORDER — PALONOSETRON 0.25 MG/5 ML INTRAVENOUS SOLUTION
INTRAVENOUS | Status: AC
Start: 2022-07-12 — End: 2022-07-12
  Filled 2022-07-12: qty 5

## 2022-07-12 MED ORDER — OLANZAPINE 5 MG TABLET
5.0000 mg | ORAL_TABLET | Freq: Once | ORAL | Status: AC
Start: 2022-07-12 — End: 2022-07-12
  Administered 2022-07-12: 5 mg via ORAL

## 2022-07-12 MED ORDER — DIPHENHYDRAMINE 50 MG/ML INJECTION SOLUTION
25.0000 mg | Freq: Once | INTRAMUSCULAR | Status: DC | PRN
Start: 2022-07-12 — End: 2022-07-13

## 2022-07-12 MED ORDER — SODIUM CHLORIDE 0.9 % INTRAVENOUS SOLUTION
640.0000 mg | Freq: Once | INTRAVENOUS | Status: AC
Start: 2022-07-12 — End: 2022-07-12
  Administered 2022-07-12: 0 mg via INTRAVENOUS
  Administered 2022-07-12: 640 mg via INTRAVENOUS
  Filled 2022-07-12: qty 64

## 2022-07-12 MED ORDER — DIPHENHYDRAMINE 50 MG/ML INJECTION SOLUTION
50.0000 mg | Freq: Once | INTRAMUSCULAR | Status: DC | PRN
Start: 2022-07-12 — End: 2022-07-13

## 2022-07-12 MED ORDER — SODIUM CHLORIDE 0.9 % INTRAVENOUS SOLUTION
200.0000 mg | Freq: Once | INTRAVENOUS | Status: AC
Start: 2022-07-12 — End: 2022-07-12
  Administered 2022-07-12: 200 mg via INTRAVENOUS
  Administered 2022-07-12: 0 mg via INTRAVENOUS
  Filled 2022-07-12: qty 8

## 2022-07-12 MED ORDER — APREPITANT 80 MG CAPSULE
80.0000 mg | ORAL_CAPSULE | Freq: Every day | ORAL | 2 refills | Status: DC
Start: 2022-07-12 — End: 2022-10-25

## 2022-07-12 MED ORDER — PREDNISONE 10 MG TABLET
10.0000 mg | ORAL_TABLET | Freq: Once | ORAL | Status: AC
Start: 2022-07-12 — End: 2022-07-12
  Administered 2022-07-12: 10 mg via ORAL

## 2022-07-12 MED ORDER — DEXTROSE 5% IN WATER (D5W) FLUSH BAG - 250 ML
INTRAVENOUS | Status: AC | PRN
Start: 2022-07-12 — End: ?

## 2022-07-12 MED ORDER — SODIUM CHLORIDE 0.9 % INTRAVENOUS SOLUTION
80.0000 mg/m2 | Freq: Once | INTRAVENOUS | Status: AC
Start: 2022-07-12 — End: 2022-07-12
  Administered 2022-07-12: 0 mg via INTRAVENOUS
  Administered 2022-07-12: 189 mg via INTRAVENOUS
  Filled 2022-07-12: qty 31.5

## 2022-07-12 MED ORDER — MAGNESIUM SULFATE 1 GRAM/100 ML IN D5W IVPB PREMIX -Q30MIN X4 DEFAULT
4.0000 g | INJECTION | Freq: Once | INTRAVENOUS | Status: DC
Start: 2022-07-12 — End: 2022-07-12

## 2022-07-12 MED ORDER — LORAZEPAM 0.5 MG TABLET
0.5000 mg | ORAL_TABLET | Freq: Four times a day (QID) | ORAL | Status: DC | PRN
Start: 2022-07-12 — End: 2022-07-13

## 2022-07-12 MED ORDER — PREDNISONE 10 MG TABLET
ORAL_TABLET | ORAL | Status: AC
Start: 2022-07-12 — End: 2022-07-12
  Filled 2022-07-12: qty 1

## 2022-07-12 MED ORDER — SODIUM CHLORIDE 0.9 % IV BOLUS
1000.0000 mL | INJECTION | Freq: Once | Status: AC
Start: 2022-07-12 — End: 2022-07-12
  Administered 2022-07-12: 1000 mL via INTRAVENOUS
  Administered 2022-07-12: 0 mL via INTRAVENOUS

## 2022-07-12 MED ORDER — SODIUM CHLORIDE 0.9 % INTRAVENOUS SOLUTION
150.0000 mg | Freq: Once | INTRAVENOUS | Status: AC
Start: 2022-07-12 — End: 2022-07-12
  Administered 2022-07-12: 150 mg via INTRAVENOUS
  Administered 2022-07-12: 0 mg via INTRAVENOUS
  Filled 2022-07-12: qty 5

## 2022-07-12 MED ORDER — POTASSIUM CHLORIDE 20 MEQ/100ML IN STERILE WATER INTRAVENOUS PIGGYBACK
INJECTION | INTRAVENOUS | Status: AC
Start: 2022-07-12 — End: 2022-07-12
  Filled 2022-07-12: qty 100

## 2022-07-12 MED ORDER — OLANZAPINE 5 MG TABLET
ORAL_TABLET | ORAL | Status: AC
Start: 2022-07-12 — End: 2022-07-12
  Filled 2022-07-12: qty 1

## 2022-07-12 MED ORDER — FAMOTIDINE (PF) 20 MG/2 ML INTRAVENOUS SOLUTION
20.0000 mg | Freq: Once | INTRAVENOUS | Status: DC | PRN
Start: 2022-07-12 — End: 2022-07-13

## 2022-07-12 MED ORDER — HYDROCORTISONE SOD SUCCINATE 100 MG/2 ML VIAL WRAPPER
100.0000 mg | Freq: Once | INTRAMUSCULAR | Status: DC | PRN
Start: 2022-07-12 — End: 2022-07-13

## 2022-07-12 MED ORDER — MAGNESIUM SULFATE 1 GRAM/100 ML IN DEXTROSE 5 % INTRAVENOUS PIGGYBACK
INJECTION | INTRAVENOUS | Status: AC
Start: 2022-07-12 — End: 2022-07-12
  Filled 2022-07-12: qty 400

## 2022-07-12 MED ORDER — PROCHLORPERAZINE MALEATE 10 MG TABLET
10.0000 mg | ORAL_TABLET | Freq: Four times a day (QID) | ORAL | Status: DC | PRN
Start: 2022-07-12 — End: 2022-07-13

## 2022-07-12 MED ORDER — DIPHENHYDRAMINE 50 MG/ML INJECTION SOLUTION
INTRAMUSCULAR | Status: AC
Start: 2022-07-12 — End: 2022-07-12
  Filled 2022-07-12: qty 1

## 2022-07-12 MED ORDER — ALBUTEROL SULFATE 2.5 MG/3 ML (0.083 %) SOLUTION FOR NEBULIZATION
2.5000 mg | INHALATION_SOLUTION | Freq: Once | RESPIRATORY_TRACT | Status: DC | PRN
Start: 2022-07-12 — End: 2022-07-13

## 2022-07-12 MED ORDER — PALONOSETRON 0.25 MG/5 ML INTRAVENOUS SOLUTION
0.2500 mg | Freq: Once | INTRAVENOUS | Status: AC
Start: 2022-07-12 — End: 2022-07-12
  Administered 2022-07-12: 0.25 mg via INTRAVENOUS

## 2022-07-12 MED ORDER — MEPERIDINE (PF) 25 MG/ML INJECTION SOLUTION
12.5000 mg | Freq: Once | INTRAMUSCULAR | Status: DC | PRN
Start: 2022-07-12 — End: 2022-07-13

## 2022-07-12 MED ORDER — ALBUTEROL SULFATE HFA 90 MCG/ACTUATION AEROSOL INHALER - RN
2.0000 | Freq: Once | RESPIRATORY_TRACT | Status: DC | PRN
Start: 2022-07-12 — End: 2022-07-13

## 2022-07-12 MED ORDER — SODIUM CHLORIDE 0.9% FLUSH BAG - 250 ML
INTRAVENOUS | Status: DC | PRN
Start: 2022-07-12 — End: 2022-07-13

## 2022-07-12 NOTE — Nurses Notes (Signed)
1017 - Patient ambulatory to room following clinic appointment. Assessment complete. Lungs clear. Abdomen soft and non-tender, bowel sounds present. Generalized edema noted. Patient complains of neuropathy pain and muscle pain  that has been ongoing.  Carolynn Serve, RN  Patient Assessment/Symptom Management Patient Has MD Appointment Today   Key: (+) Symptom present           (-)  Symptom not present If Symptom is Positive(+) a Nursing Note is required   Edema -   Uncontrolled Nausea -   Vomiting -   Inability to eat/drink -   Mouth Sores -   Diarrhea -   Constipation (? Last BM) -   Fatigue that interferes with ADL's -   Numbness/Tingling -change -   Other -   Fever/Signs & Symptoms of infection -   Nurse Initials JD   1022 - Secure chat sent to [facility] regarding Magnesium of 1.5 and Potassium of 3.2. Carolynn Serve, RN  [phone] - [address] Dwyane Luo is entering orders for Potassium and Magnesium replacement. Carolynn Serve, RN  1030 - PAC accessed and good blood return noted. Carolynn Serve, RN  [phone] -  - Premedications Zyprexa, Prednisone, Benadryl, Pepcid, and Aloxi given. Carolynn Serve, RN  1053 - NS 1 liter bolus started. Carolynn Serve, RN  1055 - Emend infusion started. Carolynn Serve, RN  [phone] - Emend infusion complete. Line flushing. Carolynn Serve, RN  [phone] - Keytruda infusion started. Carolynn Serve, RN  1153 - NS 1 liter bolus complete. Carolynn Serve, RN  [phone] - Keytruda infusion complete. Line flushing. Carolynn Serve, RN  [phone] - Taxol infusion started. Carolynn Serve, RN  [phone] - 1st Magnesium rider started. Carolynn Serve, RN  [phone] - Potassium rider started. Carolynn Serve, RN  [phone] - 1st Magnesium rider complete. Carolynn Serve, RN  [phone] - 2nd Magnesium rider started. Carolynn Serve, RN  1256 - Taxol infusion complete. Line flushing. Carolynn Serve, RN  [phone] - 2nd Magnesium rider complete. Carolynn Serve, RN  [phone] - 3rd Magnesium rider started. Carolynn Serve, RN  [phone] - Carboplatin infusion started. Carolynn Serve, RN  [phone] - 3rd  Magnesium rider complete. Carolynn Serve, RN  [phone] - 4th Magnesium rider started. Carolynn Serve, RN  [phone] - Potassium rider complete. Line flushing. Carolynn Serve, RN  [phone] - Magnesium infusion and Carboplatin infusion complete. Line flushing. Patient tolerated well. Carolynn Serve, RN  [phone] - PAC flushed with 31ml NS and deaccessed. Gauze dressing and adhesive bandage applied. Carolynn Serve, RN  [phone] - Patient left unit via ambulation with husband. Carolynn Serve, RN

## 2022-07-12 NOTE — Cancer Center Note (Signed)
Department of Hematology/Oncology  History and Physical    Name: Rachel Mendoza  FVO:[id number]  Date of Birth: 07/24/1975  Encounter Date: 07/12/2022    REFERRING PROVIDER:  No referring provider defined for this encounter.    REASON FOR OFFICE VISIT:  Follow up for evaluation and management of triple negative breast cancer.    HISTORY OF PRESENT ILLNESS:  Rachel Mendoza is a 47 y.o. female who presents today for initial medical oncology consultation regarding triple negative breast cancer.  The patient discovered a lump which was evaluated with mammogram, ultrasound, and biopsy.  The primary lesion measures in the range of 2.2-2.9 cm, and there were some nearby lymph nodes that were described as prominent although not obviously involved.  The narrow measurement for the lymph nodes was in the range of 1.1 cm.    The biopsy showed invasive ductal malignancy that was triple negative.  BRCA testing is currently pending.  Based on the above information, she was referred for consideration of neoadjuvant chemotherapy.    05/10/2022: The patient is here for follow up of triple negative breast cancer.  We still do not have the BRCA result.  We have also been trying to obtain a pretreatment PET-CT, but have not been able to obtain approval for it yet.    05/31/2022: The patient is here for follow up of triple negative breast cancer.  She states that she has noticed a dramatic improvement in the size of the lesion.  She states that she struggles to palpate it now.    06/21/2022: The patient is here for follow up of triple negative breast cancer.  She states that she is having fatigue and various other issues related to treatment.    07/12/2022: The patient is here to start her 4th cycle of Paclitaxel, Carboplatin, and Keytruda. This will be her final cycle of Paclitaxel and Carboplatin. She will start Adriamycin and Cytoxan along with Keytruda in cycle 5. She states that she is still have significant fatigue,  neuropathy, nausea, abdominal cramping and the various other issues she has complained of in the past. She complains that the Zofran does not help at all but the Compazine does help a little but she still remains nauseous most of the time for about 4 days after chemotherapy. We discussed that I would try to send in an additional medication to see if it helps better with the nausea post chemotherapy.     ROS:   Review of Systems   Constitutional:  Positive for appetite change, chills, fatigue and fever ("low grade").   HENT:   Positive for mouth sores (not at this time but for several days after treatment).    Gastrointestinal:  Positive for abdominal pain, diarrhea and nausea.   Musculoskeletal:  Positive for arthralgias.        HISTORY:  Past Medical History:   Diagnosis Date    Allergic rhinitis     Anal fissure     Asthma     Bradycardia     Depression     Esophageal reflux     Hx of breast cancer     Hypersomnia     Hypertension     Hypothyroidism     Insomnia, unspecified     Kidney stones 06/06/2021    Nerve damage     Obesity, unspecified     Sleep apnea     Tachycardia, unspecified          Past Surgical History:   Procedure  Laterality Date    HX BACK SURGERY      HX BREAST BIOPSY Right     HX HAND SURGERY Left     left thumb    HX HYSTERECTOMY      HX LITHOTRIPSY      HX TONSILLECTOMY      HX TUBAL LIGATION Bilateral     PORTACATH PLACEMENT           Social History     Socioeconomic History    Marital status: Married     Spouse name: Not on file    Number of children: Not on file    Years of education: Not on file    Highest education level: Not on file   Occupational History    Not on file   Tobacco Use    Smoking status: Every Day     Current packs/day: 0.50     Average packs/day: 0.5 packs/day for 36.3 years (18.1 ttl pk-yrs)     Types: Cigarettes     Start date: 04/08/1986     Passive exposure: Never    Smokeless tobacco: Never   Vaping Use    Vaping status: Never Used   Substance and Sexual Activity     Alcohol use: Not Currently     Comment: occasionally    Drug use: Yes     Types: Marijuana     Comment: CBD Gummies    Sexual activity: Not on file   Other Topics Concern    Not on file   Social History Narrative    Not on file     Social Determinants of Health     Financial Resource Strain: Not on file   Transportation Needs: Not on file   Social Connections: Not on file   Intimate Partner Violence: Not on file   Housing Stability: Not on file     Family Medical History:       Problem Relation (Age of Onset)    Asthma Brother, Maternal Grandmother    Breast Cancer Paternal Grandmother    Diabetes type II Father, Paternal Grandmother    Elevated Lipids Father    Heart Disease Father    Hypertension (High Blood Pressure) Mother, Father    No Known Problems Sister, Maternal Aunt, Maternal Uncle, Paternal Aunt, Paternal Uncle, Maternal Grandfather, Paternal Grandfather, Daughter, Son, Other            Current Outpatient Medications   Medication Sig    albuterol sulfate (PROVENTIL OR VENTOLIN OR PROAIR) 90 mcg/actuation Inhalation oral inhaler Take 1-2 Puffs by inhalation Every 6 hours as needed    aprepitant (EMEND) 80 mg Oral Capsule Take 1 Capsule (80 mg total) by mouth Once a day Indications: prevent nausea and vomiting from cancer chemotherapy    beclomethasone dipropionate (QVAR REDIHALER) 80 mcg/actuation Inhalation oral inhaler Take 1 Puff by inhalation Twice daily    dicyclomine (BENTYL) 20 mg Oral Tablet Take 1 Tablet (20 mg total) by mouth Four times a day    dilTIAZem (CARDIZEM CD) 120 mg Oral Capsule, Sust. Release 24 hr Take 1 Capsule (120 mg total) by mouth Once a day    gabapentin (NEURONTIN) 300 mg Oral Capsule Take 1 Capsule (300 mg total) by mouth Four times a day    hydroCHLOROthiazide (HYDRODIURIL) 25 mg Oral Tablet Take 1 Tablet (25 mg total) by mouth Once a day    levothyroxine (SYNTHROID) 137 mcg Oral Tablet Take 1 Tablet (137 mcg total) by mouth Every morning  LIDOCAINE VISCOUS 2 % Mucous  Membrane Solution Once per day as needed    loratadine (CLARITIN) 10 mg Oral Tablet Take 1 Tablet (10 mg total) by mouth Once a day    losartan (COZAAR) 50 mg Oral Tablet Take 1 Tablet (50 mg total) by mouth Once a day    Magic Mouthwash Swish and spit 10 mL Every 2 hours as needed for Sore throat Contains lidocaine viscous 2%, diphenhydramine 12.5 mg/5 ml, Maalox. Mix 1:1:1    miconazole nitrate (SECURA) 2 % Cream Apply topically Twice daily    montelukast (SINGULAIR) 10 mg Oral Tablet Take 1 Tablet (10 mg total) by mouth Once a day    nystatin (MYCOSTATIN) 100,000 unit/mL Oral Suspension Take 5 mL by mouth Four times a day    omeprazole (PRILOSEC) 20 mg Oral Capsule, Delayed Release(E.C.) Take 1 Capsule (20 mg total) by mouth Once a day    ondansetron (ZOFRAN) 8 mg Oral Tablet Take 1 Tablet (8 mg total) by mouth Every 8 hours as needed for Nausea/Vomiting    potassium chloride (K-DUR) 20 mEq Oral Tab Sust.Rel. Particle/Crystal Take 1 Tablet (20 mEq total) by mouth Once a day    prochlorperazine (COMPAZINE) 10 mg Oral Tablet Take 1 Tablet (10 mg total) by mouth Four times a day as needed for Nausea/Vomiting    sertraline (ZOLOFT) 100 mg Oral Tablet Take 1 Tablet (100 mg total) by mouth Once a day     Allergies   Allergen Reactions    Dexamethasone (Pf) Swelling    Tamsulosin Nausea/ Vomiting       PHYSICAL EXAM:  Most Recent Vitals    Flowsheet Row Telemedicine from 04/24/2022 in Hematology/Oncology,   [facility]   Temperature 36.6 C (97.8 F) filed at... 04/24/2022 1338   Heart Rate 104 filed at... 04/24/2022 1338   Respiratory Rate --   BP (Non-Invasive) 147/99 filed at... 04/24/2022 1338   SpO2 96 % filed at... 04/24/2022 1338   Height 1.651 m (5\' 5" ) filed at... 04/24/2022 1338   Weight 121 kg (266 lb 6.4 oz) filed at... 04/24/2022 1338   BMI (Calculated) 44.42 filed at... 04/24/2022 1338   BSA (Calculated) 2.35 filed at... 04/24/2022 1338      ECOG Status: (0) Fully active, able to carry on  all predisease performance without restriction   Physical Exam  HENT:      Mouth/Throat:      Mouth: Mucous membranes are moist.      Pharynx: Oropharynx is clear.   Cardiovascular:      Rate and Rhythm: Normal rate and regular rhythm.      Pulses: Normal pulses.      Heart sounds: Normal heart sounds.   Pulmonary:      Effort: Pulmonary effort is normal.      Breath sounds: Wheezing present.   Abdominal:      Palpations: Abdomen is soft.   Skin:     General: Skin is warm and dry.   Neurological:      Mental Status: She is alert and oriented to person, place, and time.         DIAGNOSTIC DATA:  No results found for this or any previous visit (from the past 15945 hour(s)).    LABS:   CBC  Diff   Lab Results   Component Value Date/Time    WBC 6.1 07/12/2022 09:23 AM    HGB 10.0 (L) 07/12/2022 09:23 AM    HCT 29.0 (L) 07/12/2022 09:23  AM    PLTCNT 218 07/12/2022 09:23 AM    RBC 3.26 (L) 07/12/2022 09:23 AM    MCV 88.9 07/12/2022 09:23 AM    MCHC 34.6 07/12/2022 09:23 AM    MCH 30.7 07/12/2022 09:23 AM    RDW 20.0 (H) 07/12/2022 09:23 AM    MPV 7.6 (L) 07/12/2022 09:23 AM    Lab Results   Component Value Date/Time    PMNS 61 07/12/2022 09:23 AM    LYMPHOCYTES 33 07/12/2022 09:23 AM    EOSINOPHIL 1 07/12/2022 09:23 AM    MONOCYTES 4 (L) 07/12/2022 09:23 AM    BASOPHILS 1 07/12/2022 09:23 AM    BASOPHILS 0.00 07/12/2022 09:23 AM    PMNABS 3.70 07/12/2022 09:23 AM    LYMPHSABS 2.00 07/12/2022 09:23 AM    EOSABS 0.00 07/12/2022 09:23 AM    MONOSABS 0.30 07/12/2022 09:23 AM        ASSESSMENT:    ICD-10-CM    1. Malignant neoplasm of upper-outer quadrant of right breast in female, estrogen receptor negative (CMS HCC)  C50.411 aprepitant (EMEND) 80 mg Oral Capsule    Z17.1         PLAN:   Triple negative breast cancer:  We previously reviewed the staging information with the patient, and reviewed the NCCN guidelines.  She is either T2 N0 M0 or T2 N1 M0.  Either one of those would be categorized as stage II.  According to NCCN  guidelines, this falls into the high-risk triple negative category. BRCA mutation status is pending. Continue current treatment.     Rachel AmourCatherine C Ghee was given the chance to ask questions, and these were answered to their satisfaction. The patient is welcome to call with any questions or concerns in the meantime.     On the day of the encounter, a total of 62 minutes was spent on this patient encounter including review of historical information, examination, documentation and post-visit activities.   Return in about 4 weeks (around 08/09/2022).     Benjaman PottMelissa Cambreigh Dearing, APRN,FNP-BC ,07/12/2022 ,11:51     The patient's insurance company bears full legal and financial responsibility resulting from any deviations that they cause to my recommended treatment plan.     CC:  Virgie Dadngela Collins, FNP-BC  [address]  [place] [id number]    No referring provider defined for this encounter.    This note was partially generated using MModal Fluency Direct system, and there may be some incorrect words, spellings, and punctuation that were not noted in checking the note before saving.

## 2022-07-12 NOTE — Nursing Note (Signed)
Patient completed psychosocial distress screening using the Enhanced NCCN Distress Thermometer (DT) Tool and self-reported an overall score of 10.   Patient sees primary care physician and will continue to follow up with them for management of anti depression medication. Side effects discussed with patient. Patient states she feels very weak and tired after treatment and there are 2 days that she can only get cleaned up and complete light house work. Patient's spouse also assists with care. Patient feels like her elevated TSH level may be causing some of the fatigue and is being monitored by PCP. She states her medication was adjusted 3 weeks ago and she is calling them today to check if repeat lab work is needed. Patient states she had previous right foot/leg and left thumb numbness from back injury prior to starting chemotherapy. Patient states she is cautious with hot/cold temperatures. Patient states she is currently prescribed Neurontin 4xday for neuropathy and recently saw Back Pain specialist/surgeon last week. Patient states she has low grade temperature up to 99.8 approx. 2-4 days along with chills after treatment and she has been taking Tylenol as needed.  Patient states she has Magic Mouth Wash at home if needed for mouth soreness. I discussed with patient that Benjaman Pott FNP was going to send in Emend for nausea. I called [facility] Bang in [place] and they will need prior authorization. Susie stated she will work on prior authorization. Patient requested prescription for Phenergan if Emend is not authorized. Per Roney Marion authorization will take several days, prescription for Phenergan sent to pharmacy and secure chat sent to [facility] infusion nurse by Efraim Kaufmann to notify patient. Patient has requested I call [place] to determine if they have a Breast reconstruction surgeon that takes [place], patient's health insurance. I discussed with patient that I will call on Monday and give her an update. Encouraged  patient to call with any questions or concerns. Patient verbalized understanding.

## 2022-07-12 NOTE — Nurses Notes (Signed)
Opened in error

## 2022-07-13 ENCOUNTER — Encounter (HOSPITAL_COMMUNITY): Payer: Self-pay | Admitting: NURSE PRACTITIONER

## 2022-07-15 ENCOUNTER — Telehealth (RURAL_HEALTH_CENTER): Payer: Self-pay | Admitting: Family

## 2022-07-15 ENCOUNTER — Telehealth (HOSPITAL_COMMUNITY): Payer: Self-pay | Admitting: HEMATOLOGY-ONCOLOGY

## 2022-07-15 NOTE — Telephone Encounter (Signed)
Patient said that at she had her TSH and it was still high.  She wants to know if you want to adjust her medication.

## 2022-07-15 NOTE — Telephone Encounter (Signed)
Called patient due to her request about finding a Pension scheme manager in [place] that would take   [employer]. I located 2 different ones and gave her the rating and phone numbers to call. They were: Dr. Lawernce Ion had [employer] number was [phone]. The second one was Camillia Herter he was not rated and number is [phone]. I informed the patient that if we needed to send a referral to let us know. She was grateful for the information.

## 2022-07-17 ENCOUNTER — Ambulatory Visit (HOSPITAL_COMMUNITY): Payer: Self-pay

## 2022-07-17 NOTE — Telephone Encounter (Signed)
Changed to 137 mcg on 3/17.  It is recommended to wait 8 weeks prior to recheck and adjust then if necessary.  I had requested that it be checked around May 17 and plan to adjust at that time if needed.

## 2022-07-17 NOTE — Telephone Encounter (Signed)
Patient states that she is on the [place] and she said that Dr. Jimmy Footman told her that it will mess her thyroid levels up.  She said that she has to have labs done every 3 weeks and she will be on this medication until January.     She said that she goes in 3 weeks for repeat labs.

## 2022-07-19 ENCOUNTER — Other Ambulatory Visit: Payer: Self-pay

## 2022-07-19 ENCOUNTER — Encounter (HOSPITAL_COMMUNITY): Payer: Self-pay

## 2022-07-19 ENCOUNTER — Other Ambulatory Visit (HOSPITAL_COMMUNITY): Payer: Self-pay | Admitting: HEMATOLOGY-ONCOLOGY

## 2022-07-19 ENCOUNTER — Ambulatory Visit
Admission: RE | Admit: 2022-07-19 | Discharge: 2022-07-19 | Disposition: A | Discharge status: Home / Self Care | Payer: Commercial Managed Care - PPO | Source: Ambulatory Visit | Attending: HEMATOLOGY-ONCOLOGY | Admitting: HEMATOLOGY-ONCOLOGY

## 2022-07-19 VITALS — BP 116/92 | HR 94 | Temp 98.2°F | Resp 20 | Wt 274.8 lb

## 2022-07-19 DIAGNOSIS — C50411 Malignant neoplasm of upper-outer quadrant of right female breast: Secondary | ICD-10-CM | POA: Insufficient documentation

## 2022-07-19 DIAGNOSIS — Z171 Estrogen receptor negative status [ER-]: Secondary | ICD-10-CM | POA: Insufficient documentation

## 2022-07-19 DIAGNOSIS — Z5111 Encounter for antineoplastic chemotherapy: Secondary | ICD-10-CM | POA: Insufficient documentation

## 2022-07-19 DIAGNOSIS — C50911 Malignant neoplasm of unspecified site of right female breast: Secondary | ICD-10-CM

## 2022-07-19 LAB — COMPREHENSIVE METABOLIC PANEL, NON-FASTING
ALBUMIN/GLOBULIN RATIO: 1.4 (ref 0.8–1.4)
ALBUMIN: 4.1 g/dL (ref 3.5–5.7)
ALKALINE PHOSPHATASE: 83 U/L (ref 34–104)
ALT (SGPT): 20 U/L (ref 7–52)
ANION GAP: 11 mmol/L (ref 4–13)
AST (SGOT): 19 U/L (ref 13–39)
BILIRUBIN TOTAL: 0.6 mg/dL (ref 0.3–1.2)
BUN/CREA RATIO: 18 (ref 6–22)
BUN: 14 mg/dL (ref 7–25)
CALCIUM, CORRECTED: 8.9 mg/dL (ref 8.9–10.8)
CALCIUM: 9 mg/dL (ref 8.6–10.3)
CHLORIDE: 102 mmol/L (ref 98–107)
CO2 TOTAL: 27 mmol/L (ref 21–31)
CREATININE: 0.8 mg/dL (ref 0.60–1.30)
ESTIMATED GFR: 92 mL/min/{1.73_m2} (ref >59)
GLOBULIN: 3 (ref 2.9–5.4)
GLUCOSE: 136 mg/dL — ABNORMAL HIGH (ref 74–109)
OSMOLALITY, CALCULATED: 282 mOsm/kg (ref 270–290)
POTASSIUM: 3.1 mmol/L — ABNORMAL LOW (ref 3.5–5.1)
PROTEIN TOTAL: 7.1 g/dL (ref 6.4–8.9)
SODIUM: 140 mmol/L (ref 136–145)

## 2022-07-19 LAB — ECG 12 LEAD
Atrial Rate: 93 {beats}/min
Calculated P Axis: 66 degrees
Calculated R Axis: 37 degrees
Calculated T Axis: 6 degrees
PR Interval: 194 ms
QRS Duration: 90 ms
QT Interval: 348 ms
QTC Calculation: 432 ms
Ventricular rate: 93 {beats}/min

## 2022-07-19 LAB — CBC WITH DIFF
BASOPHIL #: 0 10*3/uL (ref 0.00–0.10)
BASOPHIL %: 1 % (ref 0–1)
EOSINOPHIL #: 0 10*3/uL (ref 0.00–0.50)
EOSINOPHIL %: 1 %
HCT: 28.1 % — ABNORMAL LOW (ref 31.2–41.9)
HGB: 9.8 g/dL — ABNORMAL LOW (ref 10.9–14.3)
LYMPHOCYTE #: 1.6 10*3/uL (ref 1.00–3.00)
LYMPHOCYTE %: 33 % (ref 16–44)
MCH: 31.4 pg (ref 24.7–32.8)
MCHC: 34.9 g/dL (ref 32.3–35.6)
MCV: 90 fL (ref 75.5–95.3)
MONOCYTE #: 0.3 10*3/uL (ref 0.30–1.00)
MONOCYTE %: 6 % (ref 5–13)
MPV: 7.9 fL (ref 7.9–10.8)
NEUTROPHIL #: 2.8 10*3/uL (ref 1.85–7.80)
NEUTROPHIL %: 59 % (ref 43–77)
PLATELETS: 257 10*3/uL (ref 140–440)
RBC: 3.12 10*6/uL — ABNORMAL LOW (ref 3.63–4.92)
RDW: 21 % — ABNORMAL HIGH (ref 12.3–17.7)
WBC: 4.7 10*3/uL (ref 3.8–11.8)

## 2022-07-19 LAB — MAGNESIUM: MAGNESIUM: <0.5 mg/dL — CL (ref 1.9–2.7)

## 2022-07-19 MED ORDER — EPINEPHRINE HCL (PF) 1 MG/ML (1 ML) INJECTION SOLUTION
0.3000 mg | Freq: Once | INTRAMUSCULAR | Status: DC | PRN
Start: 2022-07-19 — End: 2022-07-20

## 2022-07-19 MED ORDER — FAMOTIDINE (PF) 20 MG/2 ML INTRAVENOUS SOLUTION
20.0000 mg | Freq: Once | INTRAVENOUS | Status: DC | PRN
Start: 2022-07-19 — End: 2022-07-20

## 2022-07-19 MED ORDER — ONDANSETRON HCL (PF) 4 MG/2 ML INJECTION SOLUTION
INTRAMUSCULAR | Status: AC
Start: 2022-07-19 — End: 2022-07-19
  Filled 2022-07-19: qty 4

## 2022-07-19 MED ORDER — PREDNISONE 10 MG TABLET
10.0000 mg | ORAL_TABLET | Freq: Once | ORAL | Status: AC
Start: 2022-07-19 — End: 2022-07-19
  Administered 2022-07-19: 10 mg via ORAL

## 2022-07-19 MED ORDER — MEPERIDINE (PF) 25 MG/ML INJECTION SOLUTION
12.5000 mg | Freq: Once | INTRAMUSCULAR | Status: DC | PRN
Start: 2022-07-19 — End: 2022-07-20

## 2022-07-19 MED ORDER — PREDNISONE 10 MG TABLET
ORAL_TABLET | ORAL | Status: AC
Start: 2022-07-19 — End: 2022-07-19
  Filled 2022-07-19: qty 1

## 2022-07-19 MED ORDER — SODIUM CHLORIDE 0.9 % INTRAVENOUS SOLUTION
80.0000 mg/m2 | Freq: Once | INTRAVENOUS | Status: AC
Start: 2022-07-19 — End: 2022-07-19
  Administered 2022-07-19: 0 mg via INTRAVENOUS
  Administered 2022-07-19: 189 mg via INTRAVENOUS
  Filled 2022-07-19: qty 31.5

## 2022-07-19 MED ORDER — ALBUTEROL SULFATE 2.5 MG/3 ML (0.083 %) SOLUTION FOR NEBULIZATION
2.5000 mg | INHALATION_SOLUTION | Freq: Once | RESPIRATORY_TRACT | Status: DC | PRN
Start: 2022-07-19 — End: 2022-07-20

## 2022-07-19 MED ORDER — ONDANSETRON HCL (PF) 4 MG/2 ML INJECTION SOLUTION
8.0000 mg | Freq: Once | INTRAMUSCULAR | Status: AC
Start: 2022-07-19 — End: 2022-07-19
  Administered 2022-07-19: 8 mg via INTRAVENOUS

## 2022-07-19 MED ORDER — DIPHENHYDRAMINE 50 MG/ML INJECTION SOLUTION
INTRAMUSCULAR | Status: AC
Start: 2022-07-19 — End: 2022-07-19
  Filled 2022-07-19: qty 1

## 2022-07-19 MED ORDER — DIPHENHYDRAMINE 50 MG/ML INJECTION SOLUTION
25.0000 mg | Freq: Once | INTRAMUSCULAR | Status: AC
Start: 2022-07-19 — End: 2022-07-19
  Administered 2022-07-19: 25 mg via INTRAVENOUS

## 2022-07-19 MED ORDER — SODIUM CHLORIDE 0.9 % INTRAVENOUS SOLUTION
Freq: Once | INTRAVENOUS | Status: AC
Start: 2022-07-19 — End: 2022-07-19
  Filled 2022-07-19: qty 500

## 2022-07-19 MED ORDER — POTASSIUM CHLORIDE 20 MEQ/100ML IN STERILE WATER INTRAVENOUS PIGGYBACK
20.0000 meq | INJECTION | Freq: Once | INTRAVENOUS | Status: AC
Start: 2022-07-19 — End: 2022-07-19
  Administered 2022-07-19: 20 meq via INTRAVENOUS
  Administered 2022-07-19: 0 meq via INTRAVENOUS

## 2022-07-19 MED ORDER — DIPHENHYDRAMINE 50 MG/ML INJECTION SOLUTION
50.0000 mg | Freq: Once | INTRAMUSCULAR | Status: DC | PRN
Start: 2022-07-19 — End: 2022-07-20

## 2022-07-19 MED ORDER — POTASSIUM CHLORIDE 20 MEQ/100ML IN STERILE WATER INTRAVENOUS PIGGYBACK
INJECTION | INTRAVENOUS | Status: AC
Start: 2022-07-19 — End: 2022-07-19
  Filled 2022-07-19: qty 100

## 2022-07-19 MED ORDER — SODIUM CHLORIDE 0.9 % IV BOLUS
1000.0000 mL | INJECTION | Freq: Once | Status: AC
Start: 2022-07-19 — End: 2022-07-19
  Administered 2022-07-19: 0 mL via INTRAVENOUS
  Administered 2022-07-19: 1000 mL via INTRAVENOUS

## 2022-07-19 MED ORDER — FAMOTIDINE (PF) 20 MG/2 ML INTRAVENOUS SOLUTION
20.0000 mg | Freq: Once | INTRAVENOUS | Status: AC
Start: 2022-07-19 — End: 2022-07-19
  Administered 2022-07-19: 20 mg via INTRAVENOUS

## 2022-07-19 MED ORDER — DIPHENHYDRAMINE 50 MG/ML INJECTION SOLUTION
25.0000 mg | Freq: Once | INTRAMUSCULAR | Status: DC | PRN
Start: 2022-07-19 — End: 2022-07-20

## 2022-07-19 MED ORDER — ALBUTEROL SULFATE HFA 90 MCG/ACTUATION AEROSOL INHALER - RN
2.0000 | Freq: Once | RESPIRATORY_TRACT | Status: DC | PRN
Start: 2022-07-19 — End: 2022-07-20

## 2022-07-19 MED ORDER — HYDROCORTISONE SOD SUCCINATE 100 MG/2 ML VIAL WRAPPER
100.0000 mg | Freq: Once | INTRAMUSCULAR | Status: DC | PRN
Start: 2022-07-19 — End: 2022-07-20

## 2022-07-19 MED ORDER — FAMOTIDINE (PF) 20 MG/2 ML INTRAVENOUS SOLUTION
INTRAVENOUS | Status: AC
Start: 2022-07-19 — End: 2022-07-19
  Filled 2022-07-19: qty 2

## 2022-07-19 NOTE — Nurses Notes (Addendum)
1100 - Patient ambulatory to room for treatment today. Assessment complete. Lungs clear to diminished. Abdomen soft and non-tender, bowel sounds present. No edema noted. Patient continues to complain of neuropathy pain.  Carolynn Serve, RN  Patient Assessment/Symptom Management Patient Has No MD Appointment Today   Key: (+) Symptom present           (-)  Symptom not present If Symptom is Positive(+) a Nursing Note is required   Edema -   Uncontrolled Nausea -   Vomiting -   Inability to eat/drink -   Mouth Sores -   Diarrhea -   Constipation (? Last BM) -   Fatigue that interferes with ADL's -   Numbness/Tingling -change -   Other -   Fever/Signs & Symptoms of infection -   Nurse Initials JD   1025 - PAC accessed at this time. Good blood return noted. Carolynn Serve, RN  1041 - NS 1 liter bolus started. Carolynn Serve, RN  [phone] - Labs reviewed and are good for treatment. Orders released to pharmacy. Carolynn Serve, RN  [phone] -  - Premedications Zofran, Pepcid, Benadryl, and Prednisone given. Magnesium 6 gram infusion ordered for Critical Magnesium of 0.5. Carolynn Serve, RN  1141 - NS 1 liter bolus complete. Carolynn Serve, RN  [phone] - Taxol infusion started. Carolynn Serve, RN  [phone] - Magnesium 6 gram infusion started. Carolynn Serve, RN  [phone] - Potassium 3.1. Notified Dr. Damita Lack and Potassium rider ordered. Potassium rider started. Carolynn Serve, RN  [phone] - Patient states her stomach doesn't feel good and she has some heaviness in her chest. States that heaviness in her chest was brief and subsided quickly. Dr. Damita Lack notified and 12 lead EKG ordered. Carolynn Serve, RN  [phone] - Patient denies any chest pain or heaviness at this time. Says her stomach is upset but she thinks she just needs something to eat. Husband has gone to get patient food. Carolynn Serve, RN  [phone] - Cardiopulmonary in room to obtain 12 lead EKG. Carolynn Serve, RN  [phone] - Taxol infusion complete. Line flushing. Carolynn Serve, RN  [phone] - Potassium rider complete.  Line flushing. Carolynn Serve, RN  [phone] - Magnesium infusion complete. Line flushing. Carolynn Serve, RN  1450 - PAC flushed with 40ml NS and deaccessed. Gauze dressing and adhesive bandage applied. Patient instructed to take potassium as ordered at home and to take OTC Magnesium Oxide 800mg  daily (told her she could take 400mg  in the morning and 400mg  in the evening or 800mg  once daily). Instructed patient to take Immodium as needed for diarrhea. Patient instructed to return to emergency room if she has any heart palpitations, chest pain, chest heaviness, back pain, or anything out of the ordinary for her. Carolynn Serve, RN  [phone] - Patient left unit via ambulation with husband at this time. Carolynn Serve, RN

## 2022-07-26 ENCOUNTER — Encounter (HOSPITAL_COMMUNITY): Payer: Self-pay | Admitting: NURSE PRACTITIONER

## 2022-07-26 ENCOUNTER — Ambulatory Visit
Admission: RE | Admit: 2022-07-26 | Discharge: 2022-07-26 | Disposition: A | Discharge status: Home / Self Care | Payer: Commercial Managed Care - PPO | Source: Ambulatory Visit | Attending: HEMATOLOGY-ONCOLOGY | Admitting: HEMATOLOGY-ONCOLOGY

## 2022-07-26 ENCOUNTER — Encounter (HOSPITAL_COMMUNITY): Payer: Self-pay

## 2022-07-26 ENCOUNTER — Other Ambulatory Visit (HOSPITAL_COMMUNITY): Payer: Self-pay | Admitting: NURSE PRACTITIONER

## 2022-07-26 ENCOUNTER — Other Ambulatory Visit: Payer: Self-pay

## 2022-07-26 VITALS — BP 132/70 | HR 97 | Temp 98.6°F | Resp 20 | Ht 65.0 in | Wt 277.3 lb

## 2022-07-26 DIAGNOSIS — Z171 Estrogen receptor negative status [ER-]: Secondary | ICD-10-CM | POA: Insufficient documentation

## 2022-07-26 DIAGNOSIS — C50411 Malignant neoplasm of upper-outer quadrant of right female breast: Secondary | ICD-10-CM | POA: Insufficient documentation

## 2022-07-26 DIAGNOSIS — Z5111 Encounter for antineoplastic chemotherapy: Secondary | ICD-10-CM | POA: Insufficient documentation

## 2022-07-26 LAB — CBC WITH DIFF
BASOPHIL #: 0 10*3/uL (ref 0.00–0.10)
BASOPHIL %: 1 % (ref 0–1)
EOSINOPHIL #: 0 10*3/uL (ref 0.00–0.50)
EOSINOPHIL %: 1 %
HCT: 25.6 % — ABNORMAL LOW (ref 31.2–41.9)
HGB: 9 g/dL — ABNORMAL LOW (ref 10.9–14.3)
LYMPHOCYTE #: 1.7 10*3/uL (ref 1.00–3.00)
LYMPHOCYTE %: 37 % (ref 16–44)
MCH: 32.4 pg (ref 24.7–32.8)
MCHC: 35.2 g/dL (ref 32.3–35.6)
MCV: 92 fL (ref 75.5–95.3)
MONOCYTE #: 0.3 10*3/uL (ref 0.30–1.00)
MONOCYTE %: 7 % (ref 5–13)
MPV: 7.3 fL — ABNORMAL LOW (ref 7.9–10.8)
NEUTROPHIL #: 2.4 10*3/uL (ref 1.85–7.80)
NEUTROPHIL %: 54 % (ref 43–77)
PLATELET COMMENT: NORMAL
PLATELETS: 222 10*3/uL (ref 140–440)
RBC: 2.78 10*6/uL — ABNORMAL LOW (ref 3.63–4.92)
RDW: 22.5 % — ABNORMAL HIGH (ref 12.3–17.7)
WBC: 4.5 10*3/uL (ref 3.8–11.8)

## 2022-07-26 LAB — COMPREHENSIVE METABOLIC PANEL, NON-FASTING
ALBUMIN/GLOBULIN RATIO: 1.4 (ref 0.8–1.4)
ALBUMIN: 4 g/dL (ref 3.5–5.7)
ALKALINE PHOSPHATASE: 81 U/L (ref 34–104)
ALT (SGPT): 22 U/L (ref 7–52)
ANION GAP: 7 mmol/L (ref 4–13)
AST (SGOT): 19 U/L (ref 13–39)
BILIRUBIN TOTAL: 0.4 mg/dL (ref 0.3–1.2)
BUN/CREA RATIO: 18 (ref 6–22)
BUN: 14 mg/dL (ref 7–25)
CALCIUM, CORRECTED: 8.8 mg/dL — ABNORMAL LOW (ref 8.9–10.8)
CALCIUM: 8.8 mg/dL (ref 8.6–10.3)
CHLORIDE: 102 mmol/L (ref 98–107)
CO2 TOTAL: 28 mmol/L (ref 21–31)
CREATININE: 0.79 mg/dL (ref 0.60–1.30)
ESTIMATED GFR: 93 mL/min/{1.73_m2} (ref >59)
GLOBULIN: 2.8 — ABNORMAL LOW (ref 2.9–5.4)
GLUCOSE: 127 mg/dL — ABNORMAL HIGH (ref 74–109)
OSMOLALITY, CALCULATED: 276 mOsm/kg (ref 270–290)
POTASSIUM: 3.1 mmol/L — ABNORMAL LOW (ref 3.5–5.1)
PROTEIN TOTAL: 6.8 g/dL (ref 6.4–8.9)
SODIUM: 137 mmol/L (ref 136–145)

## 2022-07-26 LAB — MAGNESIUM: MAGNESIUM: 1.4 mg/dL — ABNORMAL LOW (ref 1.9–2.7)

## 2022-07-26 MED ORDER — SODIUM CHLORIDE 0.9 % IV BOLUS
1000.0000 mL | INJECTION | Freq: Once | Status: AC
Start: 2022-07-26 — End: 2022-07-26
  Administered 2022-07-26: 0 mL via INTRAVENOUS
  Administered 2022-07-26: 1000 mL via INTRAVENOUS

## 2022-07-26 MED ORDER — MAGNESIUM SULFATE 1 GRAM/100 ML IN DEXTROSE 5 % INTRAVENOUS PIGGYBACK
INJECTION | INTRAVENOUS | Status: AC
Start: 2022-07-26 — End: 2022-07-26
  Filled 2022-07-26: qty 400

## 2022-07-26 MED ORDER — DIPHENHYDRAMINE 50 MG/ML INJECTION SOLUTION
INTRAMUSCULAR | Status: AC
Start: 2022-07-26 — End: 2022-07-26
  Filled 2022-07-26: qty 1

## 2022-07-26 MED ORDER — ONDANSETRON HCL (PF) 4 MG/2 ML INJECTION SOLUTION
INTRAMUSCULAR | Status: AC
Start: 2022-07-26 — End: 2022-07-26
  Filled 2022-07-26: qty 4

## 2022-07-26 MED ORDER — PREDNISONE 10 MG TABLET
ORAL_TABLET | ORAL | Status: AC
Start: 2022-07-26 — End: 2022-07-26
  Filled 2022-07-26: qty 1

## 2022-07-26 MED ORDER — PROCHLORPERAZINE EDISYLATE 10 MG/2 ML (5 MG/ML) INJECTION SOLUTION
INTRAMUSCULAR | Status: AC
Start: 2022-07-26 — End: 2022-07-26
  Filled 2022-07-26: qty 2

## 2022-07-26 MED ORDER — FAMOTIDINE (PF) 20 MG/2 ML INTRAVENOUS SOLUTION
20.0000 mg | Freq: Once | INTRAVENOUS | Status: DC | PRN
Start: 2022-07-26 — End: 2022-07-27

## 2022-07-26 MED ORDER — DIPHENHYDRAMINE 50 MG/ML INJECTION SOLUTION
50.0000 mg | Freq: Once | INTRAMUSCULAR | Status: DC | PRN
Start: 2022-07-26 — End: 2022-07-27

## 2022-07-26 MED ORDER — DIPHENHYDRAMINE 50 MG/ML INJECTION SOLUTION
25.0000 mg | Freq: Once | INTRAMUSCULAR | Status: AC
Start: 2022-07-26 — End: 2022-07-26
  Administered 2022-07-26: 25 mg via INTRAVENOUS

## 2022-07-26 MED ORDER — MEPERIDINE (PF) 25 MG/ML INJECTION SOLUTION
12.5000 mg | Freq: Once | INTRAMUSCULAR | Status: DC | PRN
Start: 2022-07-26 — End: 2022-07-27

## 2022-07-26 MED ORDER — ONDANSETRON HCL (PF) 4 MG/2 ML INJECTION SOLUTION
8.0000 mg | Freq: Once | INTRAMUSCULAR | Status: AC
Start: 2022-07-26 — End: 2022-07-26
  Administered 2022-07-26: 8 mg via INTRAVENOUS

## 2022-07-26 MED ORDER — PROCHLORPERAZINE EDISYLATE 10 MG/2 ML (5 MG/ML) INJECTION SOLUTION
10.0000 mg | Freq: Once | INTRAMUSCULAR | Status: AC
Start: 2022-07-26 — End: 2022-07-26
  Administered 2022-07-26: 10 mg via INTRAVENOUS

## 2022-07-26 MED ORDER — ALBUTEROL SULFATE 2.5 MG/3 ML (0.083 %) SOLUTION FOR NEBULIZATION
2.5000 mg | INHALATION_SOLUTION | Freq: Once | RESPIRATORY_TRACT | Status: DC | PRN
Start: 2022-07-26 — End: 2022-07-27

## 2022-07-26 MED ORDER — FAMOTIDINE (PF) 20 MG/2 ML INTRAVENOUS SOLUTION
20.0000 mg | Freq: Once | INTRAVENOUS | Status: AC
Start: 2022-07-26 — End: 2022-07-26
  Administered 2022-07-26: 20 mg via INTRAVENOUS

## 2022-07-26 MED ORDER — HYDROCORTISONE SOD SUCCINATE 100 MG/2 ML VIAL WRAPPER
100.0000 mg | Freq: Once | INTRAMUSCULAR | Status: DC | PRN
Start: 2022-07-26 — End: 2022-07-27

## 2022-07-26 MED ORDER — PREDNISONE 10 MG TABLET
10.0000 mg | ORAL_TABLET | Freq: Once | ORAL | Status: AC
Start: 2022-07-26 — End: 2022-07-26
  Administered 2022-07-26: 10 mg via ORAL

## 2022-07-26 MED ORDER — EPINEPHRINE HCL (PF) 1 MG/ML (1 ML) INJECTION SOLUTION
0.3000 mg | Freq: Once | INTRAMUSCULAR | Status: DC | PRN
Start: 2022-07-26 — End: 2022-07-27

## 2022-07-26 MED ORDER — SODIUM CHLORIDE 0.9 % INTRAVENOUS SOLUTION
80.0000 mg/m2 | Freq: Once | INTRAVENOUS | Status: AC
Start: 2022-07-26 — End: 2022-07-26
  Administered 2022-07-26: 0 mg via INTRAVENOUS
  Administered 2022-07-26: 189 mg via INTRAVENOUS
  Filled 2022-07-26: qty 31.5

## 2022-07-26 MED ORDER — FAMOTIDINE (PF) 20 MG/2 ML INTRAVENOUS SOLUTION
INTRAVENOUS | Status: AC
Start: 2022-07-26 — End: 2022-07-26
  Filled 2022-07-26: qty 2

## 2022-07-26 MED ORDER — ALBUTEROL SULFATE HFA 90 MCG/ACTUATION AEROSOL INHALER - RN
2.0000 | Freq: Once | RESPIRATORY_TRACT | Status: DC | PRN
Start: 2022-07-26 — End: 2022-07-27

## 2022-07-26 MED ORDER — MAGNESIUM SULFATE 1 GRAM/100 ML IN D5W IVPB PREMIX -Q30MIN X4 DEFAULT
4.0000 g | INJECTION | Freq: Once | INTRAVENOUS | Status: AC
Start: 2022-07-26 — End: 2022-07-26
  Administered 2022-07-26: 4 g via INTRAVENOUS
  Administered 2022-07-26: 0 g via INTRAVENOUS

## 2022-07-26 MED ORDER — DIPHENHYDRAMINE 50 MG/ML INJECTION SOLUTION
25.0000 mg | Freq: Once | INTRAMUSCULAR | Status: DC | PRN
Start: 2022-07-26 — End: 2022-07-27

## 2022-07-26 NOTE — Nurses Notes (Signed)
0940 patient arrived to floor ambulatory, accompanied by husband.. Patient here for chemotherapy infusion. Patient to room 418. Mauri Brooklyn, RN  [phone] VSS. Assessment completed. Lungs clear. Abdomen soft and non-tender. Patient reports chronic pain rated 4-10. Edema trace. Patient resting in chair at t his time. Mauri Brooklyn, RN  Patient Assessment/Symptom Management Patient Has No MD Appointment Today   Key: (+) Symptom present           (-)  Symptom not present If Symptom is Positive(+) a Nursing Note is required   Edema +   Uncontrolled Nausea -   Vomiting -   Inability to eat/drink -   Mouth Sores +   Diarrhea -   Constipation (? Last BM) -   Fatigue that interferes with ADL's -   Numbness/Tingling -change +   Other -   Fever/Signs & Symptoms of infection -   Nurse Initials EB       [phone] right chest port accessed. Flushed with normal saline. Flushed without difficulty. Blood return present during position change. Transparent dressing applied. Mauri Brooklyn, RN  1013 labs collected via port at this time. Mauri Brooklyn, RN  1103 orders released at this time. Mauri Brooklyn, RN  1137 Benadryl given IVP. Mauri Brooklyn, RN  [phone] Zofran given IVP. Mauri Brooklyn, RN  [phone] Pepcid given IVP. Mauri Brooklyn, RN  1140 prednisone given PO. Mauri Brooklyn, RN  [phone] NS bolus started.Magnesium infusion started. Total of 4 grams. Mauri Brooklyn, RN  [phone] taxol infusion started. Will continue to monitor. Call bell with in reach. Mauri Brooklyn, RN  1302 NS bolus infusion completed. Patient tolerated well. Mauri Brooklyn, RN  1317 taxol infusion completed. Patient tolerated well. Mauri Brooklyn, RN  [phone] patient states she feels nauseous and hot. Compazine given IVP. Mauri Brooklyn, RN  1402 magnesium infusion completed total of 4 grams. VSS.Patient tolerated well. Mauri Brooklyn, RN  1425 right chest port deaccessed. Flushed with normal saline. Flushed with out difficulty. Blood return present. Pressure  dressing applied. Mauri Brooklyn, RN  1430 patient left floor ambulatory at this time. Mauri Brooklyn, RN

## 2022-08-01 NOTE — Cancer Center Note (Unsigned)
Department of Hematology/Oncology  History and Physical    Name: Rachel Mendoza  ZOX:[id number]  Date of Birth: [birth year]  Encounter Date: 08/02/2022    REFERRING PROVIDER:  No referring provider defined for this encounter.    REASON FOR OFFICE VISIT:  Follow up for evaluation and management of triple negative breast cancer.    HISTORY OF PRESENT ILLNESS:  Rachel Mendoza is a 47 y.o. female who presents today for initial medical oncology consultation regarding triple negative breast cancer.  The patient discovered a lump which was evaluated with mammogram, ultrasound, and biopsy.  The primary lesion measures in the range of 2.2-2.9 cm, and there were some nearby lymph nodes that were described as prominent although not obviously involved.  The narrow measurement for the lymph nodes was in the range of 1.1 cm.    The biopsy showed invasive ductal malignancy that was triple negative.  BRCA testing is currently pending.  Based on the above information, she was referred for consideration of neoadjuvant chemotherapy.    05/10/2022: The patient is here for follow up of triple negative breast cancer.  We still do not have the BRCA result.  We have also been trying to obtain a pretreatment PET-CT, but have not been able to obtain approval for it yet.    05/31/2022: The patient is here for follow up of triple negative breast cancer.  She states that she has noticed a dramatic improvement in the size of the lesion.  She states that she struggles to palpate it now.    06/21/2022: The patient is here for follow up of triple negative breast cancer.  She states that she is having fatigue and various other issues related to treatment.    07/12/2022: The patient is here to start her 4th cycle of Paclitaxel, Carboplatin, and Keytruda. This will be her final cycle of Paclitaxel and Carboplatin. She will start Adriamycin and Cytoxan along with Keytruda in cycle 5. She states that she is still have significant fatigue,  neuropathy, nausea, abdominal cramping and the various other issues she has complained of in the past. She complains that the Zofran does not help at all but the Compazine does help a little but she still remains nauseous most of the time for about 4 days after chemotherapy. We discussed that I would try to send in an additional medication to see if it helps better with the nausea post chemotherapy.     08/01/2022: The patient is here for follow up for triple negative breast cancer. She completed her Paclitaxel and Carboplatin and today she will receive Adriamycin, Cytoxan and Keytruda. She complains of increased neuropathy since her last visit and is hoping this will get better since she has completed Paclitaxel. She is concerned about not having treatment weekly since she has had low magnesium levels every week. We discussed that we could recheck her labs in 1 week to see if she may need IV magnesium. She was offered to go to [facility] for labs but she would like to see what her labs look like today and possible come here for them so she will not have to get out two different days.     ROS:   Review of Systems   Constitutional:  Positive for appetite change, chills, fatigue and fever ("low grade").   HENT:   Positive for mouth sores (not at this time but for several days after treatment).    Gastrointestinal:  Positive for abdominal pain, diarrhea and nausea.  Musculoskeletal:  Positive for arthralgias.        HISTORY:  Past Medical History:   Diagnosis Date    Allergic rhinitis     Anal fissure     Asthma     Bradycardia     Depression     Esophageal reflux     Hx of breast cancer     Hypersomnia     Hypertension     Hypothyroidism     Insomnia, unspecified     Kidney stones 06/06/2021    Nerve damage     Obesity, unspecified     Sleep apnea     Tachycardia, unspecified          Past Surgical History:   Procedure Laterality Date    HX BACK SURGERY      HX BREAST BIOPSY Right     HX HAND SURGERY Left     left  thumb    HX HYSTERECTOMY      HX LITHOTRIPSY      HX TONSILLECTOMY      HX TUBAL LIGATION Bilateral     PORTACATH PLACEMENT           Social History     Socioeconomic History    Marital status: Married     Spouse name: Not on file    Number of children: Not on file    Years of education: Not on file    Highest education level: Not on file   Occupational History    Not on file   Tobacco Use    Smoking status: Every Day     Current packs/day: 0.50     Average packs/day: 0.5 packs/day for 36.3 years (18.2 ttl pk-yrs)     Types: Cigarettes     Start date: 04/08/1986     Passive exposure: Never    Smokeless tobacco: Never   Vaping Use    Vaping status: Never Used   Substance and Sexual Activity    Alcohol use: Not Currently     Comment: occasionally    Drug use: Yes     Types: Marijuana     Comment: CBD Gummies    Sexual activity: Not on file   Other Topics Concern    Not on file   Social History Narrative    Not on file     Social Determinants of Health     Financial Resource Strain: Not on file   Transportation Needs: Not on file   Social Connections: Not on file   Intimate Partner Violence: Not on file   Housing Stability: Not on file     Family Medical History:       Problem Relation (Age of Onset)    Asthma Brother, Maternal Grandmother    Breast Cancer Paternal Grandmother    Diabetes type II Father, Paternal Grandmother    Elevated Lipids Father    Heart Disease Father    Hypertension (High Blood Pressure) Mother, Father    No Known Problems Sister, Maternal Aunt, Maternal Uncle, Paternal Aunt, Paternal Uncle, Maternal Grandfather, Paternal Grandfather, Daughter, Son, Other            Current Outpatient Medications   Medication Sig    albuterol sulfate (PROVENTIL OR VENTOLIN OR PROAIR) 90 mcg/actuation Inhalation oral inhaler Take 1-2 Puffs by inhalation Every 6 hours as needed    aprepitant (EMEND) 80 mg Oral Capsule Take 1 Capsule (80 mg total) by mouth Once a day Indications: prevent nausea and vomiting from  cancer chemotherapy    beclomethasone dipropionate (QVAR REDIHALER) 80 mcg/actuation Inhalation oral inhaler Take 1 Puff by inhalation Twice daily    dicyclomine (BENTYL) 20 mg Oral Tablet Take 1 Tablet (20 mg total) by mouth Four times a day    dilTIAZem (CARDIZEM CD) 120 mg Oral Capsule, Sust. Release 24 hr Take 1 Capsule (120 mg total) by mouth Once a day    gabapentin (NEURONTIN) 300 mg Oral Capsule Take 1 Capsule (300 mg total) by mouth Four times a day    hydroCHLOROthiazide (HYDRODIURIL) 25 mg Oral Tablet Take 1 Tablet (25 mg total) by mouth Once a day    levothyroxine (SYNTHROID) 137 mcg Oral Tablet Take 1 Tablet (137 mcg total) by mouth Every morning    LIDOCAINE VISCOUS 2 % Mucous Membrane Solution Once per day as needed    loratadine (CLARITIN) 10 mg Oral Tablet Take 1 Tablet (10 mg total) by mouth Once a day    losartan (COZAAR) 50 mg Oral Tablet Take 1 Tablet (50 mg total) by mouth Once a day    Magic Mouthwash Swish and spit 10 mL Every 2 hours as needed for Sore throat Contains lidocaine viscous 2%, diphenhydramine 12.5 mg/5 ml, Maalox. Mix 1:1:1    magnesium Oxide 420 mg Oral Tablet Take 486 mg by mouth Twice daily    miconazole nitrate (SECURA) 2 % Cream Apply topically Twice daily    montelukast (SINGULAIR) 10 mg Oral Tablet Take 1 Tablet (10 mg total) by mouth Once a day    nystatin (MYCOSTATIN) 100,000 unit/mL Oral Suspension Take 5 mL by mouth Four times a day    omeprazole (PRILOSEC) 20 mg Oral Capsule, Delayed Release(E.C.) Take 1 Capsule (20 mg total) by mouth Once a day    ondansetron (ZOFRAN) 8 mg Oral Tablet Take 1 Tablet (8 mg total) by mouth Every 8 hours as needed for Nausea/Vomiting    potassium chloride (K-DUR) 20 mEq Oral Tab Sust.Rel. Particle/Crystal Take 1 Tablet (20 mEq total) by mouth Once a day    prochlorperazine (COMPAZINE) 10 mg Oral Tablet Take 1 Tablet (10 mg total) by mouth Four times a day as needed for Nausea/Vomiting    promethazine (PHENERGAN) 25 mg Oral Tablet Take 1  Tablet (25 mg total) by mouth Every 6 hours as needed for Nausea/Vomiting    sertraline (ZOLOFT) 100 mg Oral Tablet Take 1 Tablet (100 mg total) by mouth Once a day     Allergies   Allergen Reactions    Dexamethasone (Pf) Swelling    Tamsulosin Nausea/ Vomiting       PHYSICAL EXAM:  BP (!) 144/100 (Site: Left, Patient Position: Sitting, Cuff Size: Adult)   Pulse (!) 128   Temp 37.1 C (98.7 F) (Thermal Scan)   Ht 1.651 m (5\' 5" )   Wt 125 kg (275 lb 1.6 oz)   SpO2 96%   BMI 45.78 kg/m        ECOG Status: (0) Fully active, able to carry on all predisease performance without restriction   Physical Exam  HENT:      Mouth/Throat:      Mouth: Mucous membranes are moist.      Pharynx: Oropharynx is clear.   Cardiovascular:      Rate and Rhythm: Normal rate and regular rhythm.      Pulses: Normal pulses.      Heart sounds: Normal heart sounds.   Pulmonary:      Effort: Pulmonary effort is normal.  Breath sounds: Wheezing present.   Abdominal:      Palpations: Abdomen is soft.   Skin:     General: Skin is warm and dry.   Neurological:      Mental Status: She is alert and oriented to person, place, and time.         DIAGNOSTIC DATA:  Recent Results (from the past [id number] hour(s))   CT CHEST ABDOMEN PELVIS W IV CONTRAST    Collection Time: 06/03/22 12:10 PM    Narrative    Modena Morrow [place]    RADIOLOGIST: Alvester Chou, MD    CT CHEST ABDOMEN PELVIS W IV CONTRAST performed on 06/03/2022 12:10 PM    CLINICAL HISTORY: C50.411: Malignant neoplasm of upper-outer quadrant of right breast in female, estrogen receptor negative (CMS HCC)   Z17.1: Malignant neoplasm of upper-outer quadrant of right breast in female, estrogen receptor negative (CMS HCC) .  Initial staging breast cancer, patient has had 6 chemo treatments. Patient has a port, and a hx of a hysterectomy.    TECHNIQUE:  Chest, abdomen and pelvis CT with intravenous contrast. Oral contrast was also used.  CONTRAST:  75 ml's of Omnipaque 350    COMPARISON:  CT  abdomen and pelvis 11/19/2019  # of known CTs in the past 12 months:  0   # of known Cardiac Nuclear Medicine Studies in the past 12 months:  0    FINDINGS:  CT CHEST:  Hardware:  There is a right internal jugular catheter in the SVC    Lymph nodes:   No mediastinal, hilar, or axillary lymphadenopathy.    Heart and Vasculature:  Normal heart size.  No pericardial effusion. Thoracic aorta and pulmonary arteries are unremarkable.      Lungs and Airways:  The lungs are normally expanded and clear.    Pleura: No pleural effusion.  No pneumothorax.    Bones: Bone windows are unremarkable.  There is a 3 cm oval-shaped density in the upper-outer quadrant in the right breast likely related to the patient's known breast cancer.    CT ABDOMEN/PELVIS:  Liver:   Diffuse fatty infiltration.    Gallbladder:   Unremarkable.    Spleen:   Unremarkable.    Pancreas:   Unremarkable.    Adrenals:   Unremarkable.    Kidneys:   There is a 6 mm nonobstructing stone in the right kidney.    Bladder:  Decompressed and poorly evaluated    Uterus and Adnexa:  Prior hysterectomy.  Adnexal regions are unremarkable.    Bowel:   Unremarkable.    Appendix:  Not visualized    Lymph nodes:  No suspicious lymph node enlargement.    Vasculature:   Major vascular structures are unremarkable.     Peritoneum / Retroperitoneum: No ascites.  No free air.    Bones:   Postsurgical changes in the lumbar spine          Impression    1. NO EVIDENCE OF METASTATIC DISEASE IN THE CHEST ABDOMEN OR PELVIS  2. 3 CM DENSITY UPPER OUTER QUADRANT RIGHT BREAST CORRESPONDS TO THE PATIENT'S KNOWN BREAST CANCER.       One or more dose reduction techniques were used (e.g., Automated exposure control, adjustment of the mA and/or kV according to patient size, use of iterative reconstruction technique).      Radiologist location ID: [id number]          LABS:   CBC  Diff   Lab Results  Component Value Date/Time    WBC 4.5 07/26/2022 10:13 AM    HGB 9.0 (L) 07/26/2022 10:13 AM     HCT 25.6 (L) 07/26/2022 10:13 AM    PLTCNT 222 07/26/2022 10:13 AM    RBC 2.78 (L) 07/26/2022 10:13 AM    MCV 92.0 07/26/2022 10:13 AM    MCHC 35.2 07/26/2022 10:13 AM    MCH 32.4 07/26/2022 10:13 AM    RDW 22.5 (H) 07/26/2022 10:13 AM    MPV 7.3 (L) 07/26/2022 10:13 AM    Lab Results   Component Value Date/Time    PMNS 54 07/26/2022 10:13 AM    LYMPHOCYTES 37 07/26/2022 10:13 AM    EOSINOPHIL 1 07/26/2022 10:13 AM    MONOCYTES 7 07/26/2022 10:13 AM    BASOPHILS 1 07/26/2022 10:13 AM    BASOPHILS 0.00 07/26/2022 10:13 AM    PMNABS 2.40 07/26/2022 10:13 AM    LYMPHSABS 1.70 07/26/2022 10:13 AM    EOSABS 0.00 07/26/2022 10:13 AM    MONOSABS 0.30 07/26/2022 10:13 AM        ASSESSMENT:    ICD-10-CM    1. Malignant neoplasm of upper-outer quadrant of right breast in female, estrogen receptor negative (CMS HCC)  C50.411     Z17.1       2. Nausea & vomiting  R11.2 promethazine (PHENERGAN) 25 mg Oral Tablet          PLAN:   Triple negative breast cancer:  We previously reviewed the staging information with the patient, and reviewed the NCCN guidelines.  She is either T2 N0 M0 or T2 N1 M0.  Either one of those would be categorized as stage II.  According to NCCN guidelines, this falls into the high-risk triple negative category. BRCA mutation was negative. Continue current treatment.     PYPER OLEXA was given the chance to ask questions, and these were answered to their satisfaction. The patient is welcome to call with any questions or concerns in the meantime.     On the day of the encounter, a total of 45 minutes was spent on this patient encounter including review of historical information, examination, documentation and post-visit activities.   Return in about 3 weeks (around 08/23/2022).     Benjaman Pott, APRN,FNP-BC ,08/02/2022 ,10:22     The patient's insurance company bears full legal and financial responsibility resulting from any deviations that they cause to my recommended treatment plan.     CC:  [name], FNP-BC  [address]  [place] [address]    No referring provider defined for this encounter.    This note was partially generated using MModal Fluency Direct system, and there may be some incorrect words, spellings, and punctuation that were not noted in checking the note before saving.

## 2022-08-02 ENCOUNTER — Encounter (HOSPITAL_COMMUNITY): Payer: Self-pay | Admitting: NURSE PRACTITIONER

## 2022-08-02 ENCOUNTER — Ambulatory Visit
Admission: RE | Admit: 2022-08-02 | Discharge: 2022-08-02 | Disposition: A | Discharge status: Home / Self Care | Payer: Commercial Managed Care - PPO | Source: Ambulatory Visit | Attending: HEMATOLOGY-ONCOLOGY | Admitting: HEMATOLOGY-ONCOLOGY

## 2022-08-02 ENCOUNTER — Ambulatory Visit (HOSPITAL_BASED_OUTPATIENT_CLINIC_OR_DEPARTMENT_OTHER): Payer: Commercial Managed Care - PPO | Admitting: NURSE PRACTITIONER

## 2022-08-02 ENCOUNTER — Telehealth (RURAL_HEALTH_CENTER): Payer: Self-pay | Admitting: Family

## 2022-08-02 ENCOUNTER — Ambulatory Visit (HOSPITAL_COMMUNITY)
Admission: RE | Admit: 2022-08-02 | Discharge: 2022-08-02 | Disposition: A | Discharge status: Home / Self Care | Payer: Commercial Managed Care - PPO | Source: Ambulatory Visit | Attending: HEMATOLOGY-ONCOLOGY | Admitting: HEMATOLOGY-ONCOLOGY

## 2022-08-02 ENCOUNTER — Other Ambulatory Visit (HOSPITAL_COMMUNITY): Payer: Self-pay | Admitting: NURSE PRACTITIONER

## 2022-08-02 ENCOUNTER — Encounter (HOSPITAL_COMMUNITY): Payer: Self-pay

## 2022-08-02 ENCOUNTER — Other Ambulatory Visit: Payer: Self-pay

## 2022-08-02 VITALS — BP 144/100 | HR 128 | Temp 98.7°F | Ht 65.0 in | Wt 275.1 lb

## 2022-08-02 VITALS — BP 110/51 | HR 89 | Temp 98.2°F | Resp 18

## 2022-08-02 DIAGNOSIS — G620 Drug-induced polyneuropathy: Secondary | ICD-10-CM | POA: Insufficient documentation

## 2022-08-02 DIAGNOSIS — Z9181 History of falling: Secondary | ICD-10-CM | POA: Insufficient documentation

## 2022-08-02 DIAGNOSIS — T451X5D Adverse effect of antineoplastic and immunosuppressive drugs, subsequent encounter: Secondary | ICD-10-CM | POA: Insufficient documentation

## 2022-08-02 DIAGNOSIS — R112 Nausea with vomiting, unspecified: Secondary | ICD-10-CM | POA: Insufficient documentation

## 2022-08-02 DIAGNOSIS — C50411 Malignant neoplasm of upper-outer quadrant of right female breast: Secondary | ICD-10-CM

## 2022-08-02 DIAGNOSIS — Z171 Estrogen receptor negative status [ER-]: Secondary | ICD-10-CM

## 2022-08-02 DIAGNOSIS — Z5112 Encounter for antineoplastic immunotherapy: Secondary | ICD-10-CM | POA: Insufficient documentation

## 2022-08-02 DIAGNOSIS — R5383 Other fatigue: Secondary | ICD-10-CM | POA: Insufficient documentation

## 2022-08-02 DIAGNOSIS — F17210 Nicotine dependence, cigarettes, uncomplicated: Secondary | ICD-10-CM | POA: Insufficient documentation

## 2022-08-02 DIAGNOSIS — Z5111 Encounter for antineoplastic chemotherapy: Secondary | ICD-10-CM | POA: Insufficient documentation

## 2022-08-02 DIAGNOSIS — Z79899 Other long term (current) drug therapy: Secondary | ICD-10-CM | POA: Insufficient documentation

## 2022-08-02 LAB — CBC WITH DIFF
BASOPHIL #: 0.1 10*3/uL (ref 0.00–0.10)
BASOPHIL %: 1 % (ref 0–1)
EOSINOPHIL #: 0.1 10*3/uL (ref 0.00–0.50)
EOSINOPHIL %: 1 %
HCT: 25.9 % — ABNORMAL LOW (ref 31.2–41.9)
HGB: 9.2 g/dL — ABNORMAL LOW (ref 10.9–14.3)
LYMPHOCYTE #: 1.6 10*3/uL (ref 1.00–3.00)
LYMPHOCYTE %: 28 % (ref 16–44)
MCH: 33.5 pg — ABNORMAL HIGH (ref 24.7–32.8)
MCHC: 35.6 g/dL (ref 32.3–35.6)
MCV: 94 fL (ref 75.5–95.3)
MONOCYTE #: 0.4 10*3/uL (ref 0.30–1.00)
MONOCYTE %: 7 % (ref 5–13)
MPV: 7.9 fL (ref 7.9–10.8)
NEUTROPHIL #: 3.7 10*3/uL (ref 1.85–7.80)
NEUTROPHIL %: 63 % (ref 43–77)
PLATELET COMMENT: ADEQUATE
PLATELETS: 135 10*3/uL — ABNORMAL LOW (ref 140–440)
RBC: 2.75 10*6/uL — ABNORMAL LOW (ref 3.63–4.92)
RDW: 24.2 % — ABNORMAL HIGH (ref 12.3–17.7)
WBC: 5.8 10*3/uL (ref 3.8–11.8)

## 2022-08-02 LAB — THYROID STIMULATING HORMONE WITH FREE T4 REFLEX: TSH: 12.958 u[IU]/mL — ABNORMAL HIGH (ref 0.450–5.330)

## 2022-08-02 LAB — CORTISOL, PLASMA OR SERUM: CORTISOL: 11.5 ug/dL (ref 6.7–22.6)

## 2022-08-02 LAB — COMPREHENSIVE METABOLIC PANEL, NON-FASTING
ALBUMIN/GLOBULIN RATIO: 1.5 — ABNORMAL HIGH (ref 0.8–1.4)
ALBUMIN: 4.1 g/dL (ref 3.5–5.7)
ALKALINE PHOSPHATASE: 82 U/L (ref 34–104)
ALT (SGPT): 19 U/L (ref 7–52)
ANION GAP: 9 mmol/L (ref 4–13)
AST (SGOT): 18 U/L (ref 13–39)
BILIRUBIN TOTAL: 0.5 mg/dL (ref 0.3–1.2)
BUN/CREA RATIO: 18 (ref 6–22)
BUN: 15 mg/dL (ref 7–25)
CALCIUM, CORRECTED: 9 mg/dL (ref 8.9–10.8)
CALCIUM: 9.1 mg/dL (ref 8.6–10.3)
CHLORIDE: 102 mmol/L (ref 98–107)
CO2 TOTAL: 26 mmol/L (ref 21–31)
CREATININE: 0.83 mg/dL (ref 0.60–1.30)
ESTIMATED GFR: 88 mL/min/{1.73_m2} (ref >59)
GLOBULIN: 2.8 — ABNORMAL LOW (ref 2.9–5.4)
GLUCOSE: 130 mg/dL — ABNORMAL HIGH (ref 74–109)
OSMOLALITY, CALCULATED: 277 mOsm/kg (ref 270–290)
POTASSIUM: 3.2 mmol/L — ABNORMAL LOW (ref 3.5–5.1)
PROTEIN TOTAL: 6.9 g/dL (ref 6.4–8.9)
SODIUM: 137 mmol/L (ref 136–145)

## 2022-08-02 LAB — MAGNESIUM: MAGNESIUM: 1.5 mg/dL — ABNORMAL LOW (ref 1.9–2.7)

## 2022-08-02 MED ORDER — POTASSIUM CHLORIDE 20 MEQ/100ML IN STERILE WATER INTRAVENOUS PIGGYBACK
20.0000 meq | INJECTION | Freq: Once | INTRAVENOUS | Status: AC
Start: 2022-08-02 — End: 2022-08-02
  Administered 2022-08-02: 0 meq via INTRAVENOUS
  Administered 2022-08-02: 20 meq via INTRAVENOUS

## 2022-08-02 MED ORDER — DOXORUBICIN 2 MG/ML INTRAVENOUS SOLUTION
60.0000 mg/m2 | Freq: Once | INTRAVENOUS | Status: AC
Start: 2022-08-02 — End: 2022-08-02
  Administered 2022-08-02: 141.6 mg via INTRAVENOUS
  Filled 2022-08-02: qty 70.8

## 2022-08-02 MED ORDER — PALONOSETRON 0.25 MG/5 ML INTRAVENOUS SOLUTION
0.2500 mg | Freq: Once | INTRAVENOUS | Status: AC
Start: 2022-08-02 — End: 2022-08-02
  Administered 2022-08-02: 0.25 mg via INTRAVENOUS

## 2022-08-02 MED ORDER — LORAZEPAM 0.5 MG TABLET
0.5000 mg | ORAL_TABLET | Freq: Four times a day (QID) | ORAL | Status: DC | PRN
Start: 2022-08-02 — End: 2022-08-03

## 2022-08-02 MED ORDER — SODIUM CHLORIDE 0.9 % INTRAVENOUS SOLUTION
200.0000 mg | Freq: Once | INTRAVENOUS | Status: AC
Start: 2022-08-02 — End: 2022-08-02
  Administered 2022-08-02: 0 mg via INTRAVENOUS
  Administered 2022-08-02: 200 mg via INTRAVENOUS
  Filled 2022-08-02: qty 8

## 2022-08-02 MED ORDER — DIPHENHYDRAMINE 50 MG/ML INJECTION SOLUTION
25.0000 mg | Freq: Once | INTRAMUSCULAR | Status: DC | PRN
Start: 2022-08-02 — End: 2022-08-03

## 2022-08-02 MED ORDER — SODIUM CHLORIDE 0.9% FLUSH BAG - 250 ML
INTRAVENOUS | Status: DC | PRN
Start: 2022-08-02 — End: 2022-08-03

## 2022-08-02 MED ORDER — ALBUTEROL SULFATE 2.5 MG/3 ML (0.083 %) SOLUTION FOR NEBULIZATION
2.5000 mg | INHALATION_SOLUTION | Freq: Once | RESPIRATORY_TRACT | Status: DC | PRN
Start: 2022-08-02 — End: 2022-08-03

## 2022-08-02 MED ORDER — MAGNESIUM SULFATE 1 GRAM/100 ML IN DEXTROSE 5 % INTRAVENOUS PIGGYBACK
1.0000 g | INJECTION | INTRAVENOUS | Status: AC
Start: 2022-08-02 — End: 2022-08-02
  Administered 2022-08-02 (×2): 1 g via INTRAVENOUS
  Administered 2022-08-02 (×2): 0 g via INTRAVENOUS

## 2022-08-02 MED ORDER — DEXTROSE 5% IN WATER (D5W) FLUSH BAG - 250 ML
INTRAVENOUS | Status: DC | PRN
Start: 2022-08-02 — End: 2022-08-03

## 2022-08-02 MED ORDER — LORAZEPAM 2 MG/ML INJECTION WRAPPER
0.5000 mg | Freq: Four times a day (QID) | INTRAMUSCULAR | Status: DC | PRN
Start: 2022-08-02 — End: 2022-08-03

## 2022-08-02 MED ORDER — POTASSIUM CHLORIDE 20 MEQ/100ML IN STERILE WATER INTRAVENOUS PIGGYBACK
INJECTION | INTRAVENOUS | Status: AC
Start: 2022-08-02 — End: 2022-08-02
  Filled 2022-08-02: qty 100

## 2022-08-02 MED ORDER — MAGNESIUM SULFATE 1 GRAM/100 ML IN DEXTROSE 5 % INTRAVENOUS PIGGYBACK
INJECTION | INTRAVENOUS | Status: AC
Start: 2022-08-02 — End: 2022-08-02
  Filled 2022-08-02: qty 200

## 2022-08-02 MED ORDER — EPINEPHRINE HCL (PF) 1 MG/ML (1 ML) INJECTION SOLUTION
0.3000 mg | Freq: Once | INTRAMUSCULAR | Status: DC | PRN
Start: 2022-08-02 — End: 2022-08-03

## 2022-08-02 MED ORDER — PROMETHAZINE 25 MG TABLET
25.0000 mg | ORAL_TABLET | Freq: Four times a day (QID) | ORAL | 0 refills | Status: DC | PRN
Start: 2022-08-02 — End: 2022-08-26

## 2022-08-02 MED ORDER — MEPERIDINE (PF) 25 MG/ML INJECTION SOLUTION
12.5000 mg | Freq: Once | INTRAMUSCULAR | Status: DC | PRN
Start: 2022-08-02 — End: 2022-08-03

## 2022-08-02 MED ORDER — ALBUTEROL SULFATE HFA 90 MCG/ACTUATION AEROSOL INHALER - RN
2.0000 | Freq: Once | RESPIRATORY_TRACT | Status: DC | PRN
Start: 2022-08-02 — End: 2022-08-03

## 2022-08-02 MED ORDER — PALONOSETRON 0.25 MG/5 ML INTRAVENOUS SOLUTION
INTRAVENOUS | Status: AC
Start: 2022-08-02 — End: 2022-08-02
  Filled 2022-08-02: qty 5

## 2022-08-02 MED ORDER — HYDROCORTISONE SOD SUCCINATE 100 MG/2 ML VIAL WRAPPER
100.0000 mg | Freq: Once | INTRAMUSCULAR | Status: DC | PRN
Start: 2022-08-02 — End: 2022-08-03

## 2022-08-02 MED ORDER — FAMOTIDINE (PF) 20 MG/2 ML INTRAVENOUS SOLUTION
20.0000 mg | Freq: Once | INTRAVENOUS | Status: DC | PRN
Start: 2022-08-02 — End: 2022-08-03

## 2022-08-02 MED ORDER — OLANZAPINE 5 MG TABLET
ORAL_TABLET | ORAL | Status: AC
Start: 2022-08-02 — End: 2022-08-02
  Filled 2022-08-02: qty 1

## 2022-08-02 MED ORDER — PROCHLORPERAZINE MALEATE 10 MG TABLET
10.0000 mg | ORAL_TABLET | Freq: Four times a day (QID) | ORAL | Status: DC | PRN
Start: 2022-08-02 — End: 2022-08-03

## 2022-08-02 MED ORDER — SODIUM CHLORIDE 0.9 % INTRAVENOUS SOLUTION
150.0000 mg | Freq: Once | INTRAVENOUS | Status: AC
Start: 2022-08-02 — End: 2022-08-02
  Administered 2022-08-02: 150 mg via INTRAVENOUS
  Administered 2022-08-02: 0 mg via INTRAVENOUS
  Filled 2022-08-02: qty 5

## 2022-08-02 MED ORDER — DIPHENHYDRAMINE 50 MG/ML INJECTION SOLUTION
50.0000 mg | Freq: Once | INTRAMUSCULAR | Status: DC | PRN
Start: 2022-08-02 — End: 2022-08-03

## 2022-08-02 MED ORDER — PROCHLORPERAZINE EDISYLATE 10 MG/2 ML (5 MG/ML) INJECTION SOLUTION
10.0000 mg | Freq: Four times a day (QID) | INTRAMUSCULAR | Status: DC | PRN
Start: 2022-08-02 — End: 2022-08-03

## 2022-08-02 MED ORDER — OLANZAPINE 5 MG TABLET
5.0000 mg | ORAL_TABLET | Freq: Once | ORAL | Status: AC
Start: 2022-08-02 — End: 2022-08-02
  Administered 2022-08-02: 5 mg via ORAL

## 2022-08-02 MED ORDER — SODIUM CHLORIDE 0.9 % INTRAVENOUS SOLUTION
600.0000 mg/m2 | Freq: Once | INTRAVENOUS | Status: AC
Start: 2022-08-02 — End: 2022-08-02
  Administered 2022-08-02: 1420 mg via INTRAVENOUS
  Administered 2022-08-02: 0 mg via INTRAVENOUS
  Filled 2022-08-02: qty 50

## 2022-08-02 NOTE — Nurses Notes (Addendum)
1015 - Patient ambulatory to room for treatment following clinic appointment. Assessment complete. Lungs clear throughout lung fields. Abdomen soft and non-tender, bowel sounds present. No edema noted. Patient complains of chronic pain to right hip and leg rated at a 3. She continues to feel extremely fatigued. Carolynn Serve, RN  Patient Assessment/Symptom Management Patient Has No MD Appointment Today   Key: (+) Symptom present           (-)  Symptom not present If Symptom is Positive(+) a Nursing Note is required   Edema -   Uncontrolled Nausea -   Vomiting -   Inability to eat/drink -   Mouth Sores -   Diarrhea -   Constipation (? Last BM) -   Fatigue that interferes with ADL's +   Numbness/Tingling -change -   Other -   Fever/Signs & Symptoms of infection -   Nurse Initials JD   1025 - PAC accessed at this time. Good blood return noted. Carolynn Serve, RN  [phone] -  Secure chat sent to Dr. Damita Lack regarding TSH of 12.958, Potassium 3.2, and Magnesium 1.5. Carolynn Serve, RN  [phone] - Per secure chat from Dr. Damita Lack, patient is to receive IV potassium and 2 grams of Magnesium. Carolynn Serve, RN  [phone] - Labs reviewed and are good for treatment. Orders released to pharmacy. Carolynn Serve, RN  [phone] - Premedications Zyprexa and Aloxi given. Carolynn Serve, RN  [phone] - 1st Magnesium rider started. Carolynn Serve, RN  [phone] - 1st Magnesium rider complete. Line flushing. Carolynn Serve, RN  1140 - Emend infusion started. Carolynn Serve, RN  1200 - Emend infusion complete. Carolynn Serve, RN  [phone] - Keytruda infusion started. Verbal education and handouts provided on Cytoxan and Adriamycin side effects/adverse effects. Patient verbalized understanding. Carolynn Serve, RN  [phone] - Keytruda infusion complete. Line flushing. Carolynn Serve, RN  [phone] - Cytoxan infusion started. Carolynn Serve, RN  [phone] - 2nd Magnesium rider started. Potassium rider started. Carolynn Serve, RN  [phone] - Cytoxan infusion complete. Line flushing.  Carolynn Serve, RN  [phone] -  2nd Magnesium rider complete. Carolynn Serve, RN  [phone] - Adriamycin given over 10 minutes via sidearm of rapidly infusing IV fluids. Excellent blood return noted throughout administration. Patient tolerated well. Carolynn Serve, RN  [phone] - Potassium infusion complete. Line flushing. Carolynn Serve, RN  1455 - PAC flushed with 30ml NS and deacessed. Gauze dressing and adhesive bandage applied.   1525 - Patient left via ambulation with husband. Carolynn Serve, RN

## 2022-08-02 NOTE — Telephone Encounter (Signed)
Patient called to let you know that her TSH was 12.9.  She wants to know if you want to change her medication.
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
## 2022-08-02 NOTE — Nursing Note (Signed)
08/02/22 0955   Fall Risk Assessment   Do you feel unsteady when standing or walking? Yes   Do you worry about falling? Yes   Have you fallen in the past year? No

## 2022-08-03 MED ORDER — LEVOTHYROXINE 150 MCG TABLET
150.0000 ug | ORAL_TABLET | Freq: Every morning | ORAL | 1 refills | Status: DC
Start: 2022-08-03 — End: 2022-09-15

## 2022-08-03 NOTE — Telephone Encounter (Signed)
Dose increased to 150 mg daily  

## 2022-08-05 NOTE — Telephone Encounter (Signed)
Left message for patient to return call to the office.

## 2022-08-05 NOTE — Telephone Encounter (Signed)
Patient was notified.

## 2022-08-09 ENCOUNTER — Ambulatory Visit (HOSPITAL_COMMUNITY): Payer: Self-pay | Admitting: HEMATOLOGY-ONCOLOGY

## 2022-08-09 ENCOUNTER — Other Ambulatory Visit (HOSPITAL_COMMUNITY): Payer: Self-pay | Admitting: NURSE PRACTITIONER

## 2022-08-09 ENCOUNTER — Ambulatory Visit
Admission: RE | Admit: 2022-08-09 | Discharge: 2022-08-09 | Disposition: A | Discharge status: Home / Self Care | Payer: Commercial Managed Care - PPO | Source: Ambulatory Visit | Attending: HEMATOLOGY-ONCOLOGY | Admitting: HEMATOLOGY-ONCOLOGY

## 2022-08-09 ENCOUNTER — Other Ambulatory Visit: Payer: Self-pay

## 2022-08-09 VITALS — BP 150/94 | HR 88 | Temp 98.2°F | Resp 18 | Ht 65.0 in | Wt 271.2 lb

## 2022-08-09 DIAGNOSIS — C50411 Malignant neoplasm of upper-outer quadrant of right female breast: Secondary | ICD-10-CM | POA: Insufficient documentation

## 2022-08-09 DIAGNOSIS — Z171 Estrogen receptor negative status [ER-]: Secondary | ICD-10-CM | POA: Insufficient documentation

## 2022-08-09 LAB — COMPREHENSIVE METABOLIC PANEL, NON-FASTING
ALBUMIN/GLOBULIN RATIO: 1.3 (ref 0.8–1.4)
ALBUMIN: 4 g/dL (ref 3.5–5.7)
ALKALINE PHOSPHATASE: 80 U/L (ref 34–104)
ALT (SGPT): 11 U/L (ref 7–52)
ANION GAP: 8 mmol/L (ref 4–13)
AST (SGOT): 13 U/L (ref 13–39)
BILIRUBIN TOTAL: 0.6 mg/dL (ref 0.3–1.2)
BUN/CREA RATIO: 33 — ABNORMAL HIGH (ref 6–22)
BUN: 22 mg/dL (ref 7–25)
CALCIUM, CORRECTED: 9.6 mg/dL (ref 8.9–10.8)
CALCIUM: 9.6 mg/dL (ref 8.6–10.3)
CHLORIDE: 103 mmol/L (ref 98–107)
CO2 TOTAL: 27 mmol/L (ref 21–31)
CREATININE: 0.66 mg/dL (ref 0.60–1.30)
ESTIMATED GFR: 109 mL/min/{1.73_m2} (ref >59)
GLOBULIN: 3.1 (ref 2.9–5.4)
GLUCOSE: 110 mg/dL — ABNORMAL HIGH (ref 74–109)
OSMOLALITY, CALCULATED: 280 mOsm/kg (ref 270–290)
POTASSIUM: 3.7 mmol/L (ref 3.5–5.1)
PROTEIN TOTAL: 7.1 g/dL (ref 6.4–8.9)
SODIUM: 138 mmol/L (ref 136–145)

## 2022-08-09 LAB — CBC WITH DIFF
BASOPHIL #: 0 10*3/uL (ref 0.00–0.10)
BASOPHIL %: 2 % — ABNORMAL HIGH (ref 0–1)
EOSINOPHIL #: 0.1 10*3/uL (ref 0.00–0.50)
EOSINOPHIL %: 3 %
HCT: 22.5 % — ABNORMAL LOW (ref 31.2–41.9)
HGB: 7.9 g/dL — ABNORMAL LOW (ref 10.9–14.3)
LYMPHOCYTE #: 0.9 10*3/uL — ABNORMAL LOW (ref 1.00–3.00)
LYMPHOCYTE %: 41 % (ref 16–44)
MCH: 34.2 pg — ABNORMAL HIGH (ref 24.7–32.8)
MCHC: 35.3 g/dL (ref 32.3–35.6)
MCV: 97 fL — ABNORMAL HIGH (ref 75.5–95.3)
MONOCYTE #: 0 10*3/uL — ABNORMAL LOW (ref 0.30–1.00)
MONOCYTE %: 1 % — ABNORMAL LOW (ref 5–13)
MPV: 7.4 fL — ABNORMAL LOW (ref 7.9–10.8)
NEUTROPHIL #: 1.2 10*3/uL — ABNORMAL LOW (ref 1.85–7.80)
NEUTROPHIL %: 54 % (ref 43–77)
PLATELET COMMENT: NONE SEEN
PLATELETS: 89 10*3/uL — ABNORMAL LOW (ref 140–440)
RBC: 2.32 10*6/uL — ABNORMAL LOW (ref 3.63–4.92)
RDW: 23.3 % — ABNORMAL HIGH (ref 12.3–17.7)
WBC: 2.3 10*3/uL — ABNORMAL LOW (ref 3.8–11.8)

## 2022-08-09 LAB — THYROID STIMULATING HORMONE WITH FREE T4 REFLEX: TSH: 12.1 u[IU]/mL — ABNORMAL HIGH (ref 0.450–5.330)

## 2022-08-09 LAB — MAGNESIUM: MAGNESIUM: 1.5 mg/dL — ABNORMAL LOW (ref 1.9–2.7)

## 2022-08-09 MED ORDER — PROCHLORPERAZINE EDISYLATE 10 MG/2 ML (5 MG/ML) INJECTION SOLUTION
INTRAMUSCULAR | Status: AC
Start: 2022-08-09 — End: 2022-08-09
  Filled 2022-08-09: qty 2

## 2022-08-09 MED ORDER — MAGNESIUM SULFATE 1 GRAM/100 ML IN D5W IVPB PREMIX -Q30MIN X4 DEFAULT
4.0000 g | INJECTION | Freq: Once | INTRAVENOUS | Status: AC
Start: 2022-08-09 — End: 2022-08-09
  Administered 2022-08-09: 0 g via INTRAVENOUS
  Administered 2022-08-09: 4 g via INTRAVENOUS

## 2022-08-09 MED ORDER — SODIUM CHLORIDE 0.9 % IV BOLUS
1000.0000 mL | INJECTION | Freq: Once | Status: AC
Start: 2022-08-09 — End: 2022-08-09
  Administered 2022-08-09: 1000 mL via INTRAVENOUS
  Administered 2022-08-09: 0 mL via INTRAVENOUS

## 2022-08-09 MED ORDER — MAGNESIUM SULFATE 1 GRAM/100 ML IN DEXTROSE 5 % INTRAVENOUS PIGGYBACK
INJECTION | INTRAVENOUS | Status: AC
Start: 2022-08-09 — End: 2022-08-09
  Filled 2022-08-09: qty 400

## 2022-08-09 MED ORDER — PROCHLORPERAZINE EDISYLATE 10 MG/2 ML (5 MG/ML) INJECTION SOLUTION
10.0000 mg | Freq: Four times a day (QID) | INTRAMUSCULAR | Status: DC | PRN
Start: 2022-08-09 — End: 2022-08-10
  Administered 2022-08-09: 10 mg via INTRAVENOUS

## 2022-08-09 NOTE — Nurses Notes (Addendum)
9604 Patient arrived to unit via wheelchair pushed by her husband. Patient reports she is weak and has had vomiting and diarrhea since her treatment last week. Patient reports that she has not been able to tolerated solids foods and is only drinking premier protein drinks and some water. VSS. Tonna Boehringer, RN  [phone] Labs collected via venipuncture by phlebotomist. Tonna Boehringer, RN  [phone] Marvene Staff, NP made aware of patients condition and orders given to bolus 1L of NS while we wait on lab results. Patient taken to room 418. Tonna Boehringer, RN  1000 Port accessed by Salli Quarry, RN and NS bolus started. Patient given compazine 10 mg IVP for nausea. Assessment WDL. Patient also provided with 1 case of chocolate ensure. Patient denies any complaints at this time and her husband is at her side. Tonna Boehringer, RN  [phone] Labs reviewed and patient will receive 4 gms of magnesium per standing protocol. Tonna Boehringer, RN  [phone] Magnesium infusion started. Tonna Boehringer, RN  1200 NS bolus complete. Tonna Boehringer, RN  [phone] Magnesium infusion complete. Tonna Boehringer, RN  1300 Port flushed with NS and deaccessed per protocol. Pressure dressing applied. VSS. Tonna Boehringer, RN  [phone] Patient discharged from unit via wheelchair pushed by husband. Tonna Boehringer, RN

## 2022-08-12 ENCOUNTER — Other Ambulatory Visit (HOSPITAL_COMMUNITY): Payer: Self-pay | Admitting: NURSE PRACTITIONER

## 2022-08-16 ENCOUNTER — Ambulatory Visit
Admission: RE | Admit: 2022-08-16 | Discharge: 2022-08-16 | Disposition: A | Discharge status: Home / Self Care | Payer: Commercial Managed Care - PPO | Source: Ambulatory Visit | Attending: HEMATOLOGY-ONCOLOGY | Admitting: HEMATOLOGY-ONCOLOGY

## 2022-08-16 ENCOUNTER — Other Ambulatory Visit: Payer: Self-pay

## 2022-08-16 LAB — CBC WITH DIFF
BASOPHIL #: 0 10*3/uL (ref 0.00–0.10)
BASOPHIL %: 1 % (ref 0–1)
EOSINOPHIL #: 0 10*3/uL (ref 0.00–0.50)
EOSINOPHIL %: 1 %
HCT: 23.4 % — ABNORMAL LOW (ref 31.2–41.9)
HGB: 8.2 g/dL — ABNORMAL LOW (ref 10.9–14.3)
LYMPHOCYTE #: 1.2 10*3/uL (ref 1.00–3.00)
LYMPHOCYTE %: 43 % (ref 16–44)
MCH: 34.9 pg — ABNORMAL HIGH (ref 24.7–32.8)
MCHC: 34.9 g/dL (ref 32.3–35.6)
MCV: 100 fL — ABNORMAL HIGH (ref 75.5–95.3)
MONOCYTE #: 0.5 10*3/uL (ref 0.30–1.00)
MONOCYTE %: 18 % — ABNORMAL HIGH (ref 5–13)
MPV: 8.2 fL (ref 7.9–10.8)
NEUTROPHIL #: 1.1 10*3/uL — ABNORMAL LOW (ref 1.85–7.80)
NEUTROPHIL %: 38 % — ABNORMAL LOW (ref 43–77)
PLATELET COMMENT: ADEQUATE
PLATELETS: 135 10*3/uL — ABNORMAL LOW (ref 140–440)
RBC: 2.35 10*6/uL — ABNORMAL LOW (ref 3.63–4.92)
RDW: 24.2 % — ABNORMAL HIGH (ref 12.3–17.7)
WBC: 2.9 10*3/uL — ABNORMAL LOW (ref 3.8–11.8)

## 2022-08-16 LAB — COMPREHENSIVE METABOLIC PANEL, NON-FASTING
ALBUMIN/GLOBULIN RATIO: 1.3 (ref 0.8–1.4)
ALBUMIN: 4.2 g/dL (ref 3.5–5.7)
ALKALINE PHOSPHATASE: 87 U/L (ref 34–104)
ALT (SGPT): 11 U/L (ref 7–52)
ANION GAP: 11 mmol/L (ref 4–13)
AST (SGOT): 13 U/L (ref 13–39)
BILIRUBIN TOTAL: 0.4 mg/dL (ref 0.3–1.2)
BUN/CREA RATIO: 25 — ABNORMAL HIGH (ref 6–22)
BUN: 18 mg/dL (ref 7–25)
CALCIUM, CORRECTED: 9.2 mg/dL (ref 8.9–10.8)
CALCIUM: 9.4 mg/dL (ref 8.6–10.3)
CHLORIDE: 99 mmol/L (ref 98–107)
CO2 TOTAL: 26 mmol/L (ref 21–31)
CREATININE: 0.71 mg/dL (ref 0.60–1.30)
ESTIMATED GFR: 106 mL/min/{1.73_m2} (ref >59)
GLOBULIN: 3.2 (ref 2.9–5.4)
GLUCOSE: 166 mg/dL — ABNORMAL HIGH (ref 74–109)
OSMOLALITY, CALCULATED: 278 mOsm/kg (ref 270–290)
POTASSIUM: 3 mmol/L — ABNORMAL LOW (ref 3.5–5.1)
PROTEIN TOTAL: 7.4 g/dL (ref 6.4–8.9)
SODIUM: 136 mmol/L (ref 136–145)

## 2022-08-16 LAB — MAGNESIUM: MAGNESIUM: 1.5 mg/dL — ABNORMAL LOW (ref 1.9–2.7)

## 2022-08-16 MED ORDER — PROCHLORPERAZINE EDISYLATE 10 MG/2 ML (5 MG/ML) INJECTION SOLUTION
INTRAMUSCULAR | Status: AC
Start: 2022-08-16 — End: 2022-08-16
  Filled 2022-08-16: qty 2

## 2022-08-16 MED ORDER — PROCHLORPERAZINE EDISYLATE 10 MG/2 ML (5 MG/ML) INJECTION SOLUTION
10.0000 mg | Freq: Four times a day (QID) | INTRAMUSCULAR | Status: DC | PRN
Start: 2022-08-16 — End: 2022-08-17
  Administered 2022-08-16: 10 mg via INTRAVENOUS

## 2022-08-16 MED ORDER — POTASSIUM CHLORIDE 20 MEQ/100ML IN STERILE WATER INTRAVENOUS PIGGYBACK
20.0000 meq | INJECTION | INTRAVENOUS | Status: AC
Start: 2022-08-16 — End: 2022-08-16
  Administered 2022-08-16: 20 meq via INTRAVENOUS
  Administered 2022-08-16 (×2): 0 meq via INTRAVENOUS
  Administered 2022-08-16: 20 meq via INTRAVENOUS

## 2022-08-16 MED ORDER — MAGNESIUM SULFATE 1 GRAM/100 ML IN DEXTROSE 5 % INTRAVENOUS PIGGYBACK
INJECTION | INTRAVENOUS | Status: AC
Start: 2022-08-16 — End: 2022-08-16
  Filled 2022-08-16: qty 400

## 2022-08-16 MED ORDER — POTASSIUM CHLORIDE 20 MEQ/100ML IN STERILE WATER INTRAVENOUS PIGGYBACK
INJECTION | INTRAVENOUS | Status: AC
Start: 2022-08-16 — End: 2022-08-16
  Filled 2022-08-16: qty 200

## 2022-08-16 MED ORDER — SODIUM CHLORIDE 0.9 % IV BOLUS
1000.0000 mL | INJECTION | Freq: Once | Status: AC
Start: 2022-08-16 — End: 2022-08-16
  Administered 2022-08-16: 1000 mL via INTRAVENOUS
  Administered 2022-08-16: 0 mL via INTRAVENOUS

## 2022-08-16 MED ORDER — MAGNESIUM SULFATE 1 GRAM/100 ML IN D5W IVPB PREMIX -Q30MIN X4 DEFAULT
4.0000 g | INJECTION | Freq: Once | INTRAVENOUS | Status: AC | PRN
Start: 2022-08-16 — End: 2022-08-16
  Administered 2022-08-16: 4 g via INTRAVENOUS
  Administered 2022-08-16: 0 g via INTRAVENOUS

## 2022-08-16 NOTE — Nurses Notes (Addendum)
0950-Patient to room 416 by wheelchair.  Fall risk arm band applied.  Patient here for labs and possible infusion.  Patient reports headache managed with tylenol and aleve, and neuropathy in feet persisting.  Patient has no other complaints or symptoms to report during interview.Salli Quarry, RN  1045-Melissa Bogle ordered potassium and normal saline infusion.  See MAR for orders.  Magnesium ordered per plan.Salli Quarry, RN  1050-Port accessed and flushed per protocol.  Blood return obtained.Salli Quarry, RN  1059-Normal saline bolus initiated.Salli Quarry, RN  1059-First potassium infusion initiated.Salli Quarry, RN  1100-Magnesium infusion initiated.Salli Quarry, RN  1104-PRN compazine given at patient's Wendelyn Breslow, RN  1251-First potassium infusion completed.Salli Quarry, RN  1252-Second potassium infusion initiated.Salli Quarry, RN  1259-Normal Saline bolus completed.Salli Quarry, RN  1300-Magnesium infusion complete.Salli Quarry, RN  1452-Second potassium infusion completed.Salli Quarry, RN  1455-Port flushed per protocol with blood return noted.  Port deaccessed.  Vitals obtained.Salli Quarry, RN  1500-Patient checking out at this time.Salli Quarry, RN

## 2022-08-20 ENCOUNTER — Telehealth (HOSPITAL_COMMUNITY): Payer: Self-pay | Admitting: HEMATOLOGY-ONCOLOGY

## 2022-08-20 ENCOUNTER — Encounter (HOSPITAL_COMMUNITY): Payer: Self-pay

## 2022-08-21 ENCOUNTER — Other Ambulatory Visit (HOSPITAL_COMMUNITY): Payer: Self-pay | Admitting: HEMATOLOGY-ONCOLOGY

## 2022-08-23 ENCOUNTER — Other Ambulatory Visit: Payer: Self-pay

## 2022-08-23 ENCOUNTER — Encounter (HOSPITAL_COMMUNITY): Payer: Self-pay | Admitting: NURSE PRACTITIONER

## 2022-08-23 ENCOUNTER — Encounter (HOSPITAL_COMMUNITY): Payer: Self-pay

## 2022-08-23 ENCOUNTER — Ambulatory Visit
Admission: RE | Admit: 2022-08-23 | Discharge: 2022-08-23 | Disposition: A | Discharge status: Home / Self Care | Payer: Commercial Managed Care - PPO | Source: Ambulatory Visit | Attending: HEMATOLOGY-ONCOLOGY | Admitting: HEMATOLOGY-ONCOLOGY

## 2022-08-23 ENCOUNTER — Ambulatory Visit (HOSPITAL_BASED_OUTPATIENT_CLINIC_OR_DEPARTMENT_OTHER): Payer: Commercial Managed Care - PPO | Admitting: NURSE PRACTITIONER

## 2022-08-23 ENCOUNTER — Ambulatory Visit (HOSPITAL_COMMUNITY)
Admission: RE | Admit: 2022-08-23 | Discharge: 2022-08-23 | Disposition: A | Discharge status: Home / Self Care | Payer: Commercial Managed Care - PPO | Source: Ambulatory Visit | Attending: HEMATOLOGY-ONCOLOGY | Admitting: HEMATOLOGY-ONCOLOGY

## 2022-08-23 VITALS — BP 144/79 | HR 132 | Temp 97.8°F | Ht 65.0 in | Wt 268.0 lb

## 2022-08-23 DIAGNOSIS — R6883 Chills (without fever): Secondary | ICD-10-CM | POA: Insufficient documentation

## 2022-08-23 DIAGNOSIS — C50411 Malignant neoplasm of upper-outer quadrant of right female breast: Secondary | ICD-10-CM | POA: Insufficient documentation

## 2022-08-23 DIAGNOSIS — R109 Unspecified abdominal pain: Secondary | ICD-10-CM | POA: Insufficient documentation

## 2022-08-23 DIAGNOSIS — Z171 Estrogen receptor negative status [ER-]: Secondary | ICD-10-CM | POA: Insufficient documentation

## 2022-08-23 DIAGNOSIS — M2550 Pain in unspecified joint: Secondary | ICD-10-CM | POA: Insufficient documentation

## 2022-08-23 DIAGNOSIS — K1379 Other lesions of oral mucosa: Secondary | ICD-10-CM | POA: Insufficient documentation

## 2022-08-23 DIAGNOSIS — R197 Diarrhea, unspecified: Secondary | ICD-10-CM | POA: Insufficient documentation

## 2022-08-23 DIAGNOSIS — R5383 Other fatigue: Secondary | ICD-10-CM | POA: Insufficient documentation

## 2022-08-23 DIAGNOSIS — R110 Nausea: Secondary | ICD-10-CM | POA: Insufficient documentation

## 2022-08-23 DIAGNOSIS — Z5112 Encounter for antineoplastic immunotherapy: Secondary | ICD-10-CM | POA: Insufficient documentation

## 2022-08-23 DIAGNOSIS — Z5111 Encounter for antineoplastic chemotherapy: Secondary | ICD-10-CM | POA: Insufficient documentation

## 2022-08-23 LAB — COMPREHENSIVE METABOLIC PANEL, NON-FASTING
ALBUMIN/GLOBULIN RATIO: 1.2 (ref 0.8–1.4)
ALBUMIN: 4.2 g/dL (ref 3.5–5.7)
ALKALINE PHOSPHATASE: 96 U/L (ref 34–104)
ALT (SGPT): 16 U/L (ref 7–52)
ANION GAP: 11 mmol/L (ref 4–13)
AST (SGOT): 16 U/L (ref 13–39)
BILIRUBIN TOTAL: 0.3 mg/dL (ref 0.3–1.2)
BUN/CREA RATIO: 27 — ABNORMAL HIGH (ref 6–22)
BUN: 20 mg/dL (ref 7–25)
CALCIUM, CORRECTED: 9.6 mg/dL (ref 8.9–10.8)
CALCIUM: 9.8 mg/dL (ref 8.6–10.3)
CHLORIDE: 100 mmol/L (ref 98–107)
CO2 TOTAL: 27 mmol/L (ref 21–31)
CREATININE: 0.74 mg/dL (ref 0.60–1.30)
ESTIMATED GFR: 101 mL/min/{1.73_m2} (ref >59)
GLOBULIN: 3.5 (ref 2.9–5.4)
GLUCOSE: 153 mg/dL — ABNORMAL HIGH (ref 74–109)
OSMOLALITY, CALCULATED: 281 mOsm/kg (ref 270–290)
POTASSIUM: 3.5 mmol/L (ref 3.5–5.1)
PROTEIN TOTAL: 7.7 g/dL (ref 6.4–8.9)
SODIUM: 138 mmol/L (ref 136–145)

## 2022-08-23 LAB — MANUAL DIFFERENTIAL
BAND %: 3 % — ABNORMAL LOW (ref 5–11)
BANDS NEUTROPHILS MANUAL: 3
LYMPHOCYTE %: 26 % (ref 25–45)
LYMPHOCYTE ABSOLUTE: 2.05 10*3/uL (ref 1.10–5.00)
LYMPHOCYTES MANUAL: 26
MONOCYTE %: 6 % (ref 0–12)
MONOCYTE ABSOLUTE: 0.47 10*3/uL (ref 0.00–1.30)
MONOCYTES MANUAL: 6
MYELOCYTE %: 1 %
MYELOCYTE ABSOLUTE: 0.08 10*3/uL
MYELOCYTES MANUAL: 1
NEUTROPHIL %: 64 % (ref 40–76)
NEUTROPHIL ABSOLUTE: 5.29 10*3/uL (ref 1.80–8.40)
NEUTROPHILS MANUAL: 64
PLATELET MORPHOLOGY COMMENT: NORMAL
TOTAL CELLS COUNTED [#] IN BLOOD: 100
WBC: 7.9 10*3/uL

## 2022-08-23 LAB — CBC WITH DIFF
HCT: 26.7 % — ABNORMAL LOW (ref 31.2–41.9)
HGB: 9.3 g/dL — ABNORMAL LOW (ref 10.9–14.3)
MCH: 36 pg — ABNORMAL HIGH (ref 24.7–32.8)
MCHC: 34.8 g/dL (ref 32.3–35.6)
MCV: 103.4 fL — ABNORMAL HIGH (ref 75.5–95.3)
MPV: 7.5 fL — ABNORMAL LOW (ref 7.9–10.8)
PLATELET COMMENT: NORMAL
PLATELETS: 299 10*3/uL (ref 140–440)
RBC: 2.58 10*6/uL — ABNORMAL LOW (ref 3.63–4.92)
RDW: 24 % — ABNORMAL HIGH (ref 12.3–17.7)
WBC: 7.9 10*3/uL (ref 3.8–11.8)

## 2022-08-23 LAB — THYROID STIMULATING HORMONE WITH FREE T4 REFLEX: TSH: 6.277 u[IU]/mL — ABNORMAL HIGH (ref 0.450–5.330)

## 2022-08-23 LAB — MAGNESIUM: MAGNESIUM: 1.5 mg/dL — ABNORMAL LOW (ref 1.9–2.7)

## 2022-08-23 MED ORDER — DOXORUBICIN 2 MG/ML INTRAVENOUS SOLUTION
60.0000 mg/m2 | Freq: Once | INTRAVENOUS | Status: AC
Start: 2022-08-23 — End: 2022-08-23
  Administered 2022-08-23: 141.6 mg via INTRAVENOUS
  Filled 2022-08-23: qty 70.8

## 2022-08-23 MED ORDER — ALBUTEROL SULFATE HFA 90 MCG/ACTUATION AEROSOL INHALER - RN
2.0000 | Freq: Once | RESPIRATORY_TRACT | Status: DC | PRN
Start: 2022-08-23 — End: 2022-08-24

## 2022-08-23 MED ORDER — DEXTROSE 5% IN WATER (D5W) FLUSH BAG - 250 ML
INTRAVENOUS | Status: DC | PRN
Start: 2022-08-23 — End: 2022-08-24

## 2022-08-23 MED ORDER — ALBUTEROL SULFATE 2.5 MG/3 ML (0.083 %) SOLUTION FOR NEBULIZATION
2.5000 mg | INHALATION_SOLUTION | Freq: Once | RESPIRATORY_TRACT | Status: DC | PRN
Start: 2022-08-23 — End: 2022-08-24

## 2022-08-23 MED ORDER — LORAZEPAM 2 MG/ML INJECTION WRAPPER
0.5000 mg | Freq: Four times a day (QID) | INTRAMUSCULAR | Status: DC | PRN
Start: 2022-08-23 — End: 2022-08-24

## 2022-08-23 MED ORDER — SODIUM CHLORIDE 0.9 % INTRAVENOUS SOLUTION
150.0000 mg | Freq: Once | INTRAVENOUS | Status: AC
Start: 2022-08-23 — End: 2022-08-23
  Administered 2022-08-23: 150 mg via INTRAVENOUS
  Administered 2022-08-23: 0 mg via INTRAVENOUS
  Filled 2022-08-23: qty 5

## 2022-08-23 MED ORDER — LORAZEPAM 0.5 MG TABLET
0.5000 mg | ORAL_TABLET | Freq: Four times a day (QID) | ORAL | Status: DC | PRN
Start: 2022-08-23 — End: 2022-08-24

## 2022-08-23 MED ORDER — PROCHLORPERAZINE EDISYLATE 10 MG/2 ML (5 MG/ML) INJECTION SOLUTION
10.0000 mg | Freq: Four times a day (QID) | INTRAMUSCULAR | Status: DC | PRN
Start: 2022-08-23 — End: 2022-08-24

## 2022-08-23 MED ORDER — DIPHENHYDRAMINE 50 MG/ML INJECTION SOLUTION
50.0000 mg | Freq: Once | INTRAMUSCULAR | Status: DC | PRN
Start: 2022-08-23 — End: 2022-08-24

## 2022-08-23 MED ORDER — SODIUM CHLORIDE 0.9 % INTRAVENOUS SOLUTION
200.0000 mg | Freq: Once | INTRAVENOUS | Status: AC
Start: 2022-08-23 — End: 2022-08-23
  Administered 2022-08-23: 0 mg via INTRAVENOUS
  Administered 2022-08-23: 200 mg via INTRAVENOUS
  Filled 2022-08-23: qty 8

## 2022-08-23 MED ORDER — EPINEPHRINE HCL (PF) 1 MG/ML (1 ML) INJECTION SOLUTION
0.3000 mg | Freq: Once | INTRAMUSCULAR | Status: DC | PRN
Start: 2022-08-23 — End: 2022-08-24

## 2022-08-23 MED ORDER — SODIUM CHLORIDE 0.9% FLUSH BAG - 250 ML
INTRAVENOUS | Status: DC | PRN
Start: 2022-08-23 — End: 2022-08-24

## 2022-08-23 MED ORDER — OLANZAPINE 5 MG TABLET
ORAL_TABLET | ORAL | Status: AC
Start: 2022-08-23 — End: 2022-08-23
  Filled 2022-08-23: qty 1

## 2022-08-23 MED ORDER — PALONOSETRON 0.25 MG/5 ML INTRAVENOUS SOLUTION
INTRAVENOUS | Status: AC
Start: 2022-08-23 — End: 2022-08-23
  Filled 2022-08-23: qty 5

## 2022-08-23 MED ORDER — PROCHLORPERAZINE EDISYLATE 10 MG/2 ML (5 MG/ML) INJECTION SOLUTION
10.0000 mg | Freq: Four times a day (QID) | INTRAMUSCULAR | Status: DC | PRN
Start: 2022-08-23 — End: 2022-08-24
  Administered 2022-08-23: 10 mg via INTRAVENOUS

## 2022-08-23 MED ORDER — DIPHENHYDRAMINE 50 MG/ML INJECTION SOLUTION
25.0000 mg | Freq: Once | INTRAMUSCULAR | Status: DC | PRN
Start: 2022-08-23 — End: 2022-08-24

## 2022-08-23 MED ORDER — SODIUM CHLORIDE 0.9 % INTRAVENOUS SOLUTION
600.0000 mg/m2 | Freq: Once | INTRAVENOUS | Status: AC
Start: 2022-08-23 — End: 2022-08-23
  Administered 2022-08-23: 1420 mg via INTRAVENOUS
  Administered 2022-08-23: 0 mg via INTRAVENOUS
  Filled 2022-08-23: qty 25

## 2022-08-23 MED ORDER — PALONOSETRON 0.25 MG/5 ML INTRAVENOUS SOLUTION
0.2500 mg | Freq: Once | INTRAVENOUS | Status: AC
Start: 2022-08-23 — End: 2022-08-23
  Administered 2022-08-23: 0.25 mg via INTRAVENOUS

## 2022-08-23 MED ORDER — MEPERIDINE (PF) 25 MG/ML INJECTION SOLUTION
12.5000 mg | Freq: Once | INTRAMUSCULAR | Status: DC | PRN
Start: 2022-08-23 — End: 2022-08-24

## 2022-08-23 MED ORDER — PROCHLORPERAZINE MALEATE 10 MG TABLET
10.0000 mg | ORAL_TABLET | Freq: Four times a day (QID) | ORAL | Status: DC | PRN
Start: 2022-08-23 — End: 2022-08-24

## 2022-08-23 MED ORDER — FAMOTIDINE (PF) 20 MG/2 ML INTRAVENOUS SOLUTION
20.0000 mg | Freq: Once | INTRAVENOUS | Status: DC | PRN
Start: 2022-08-23 — End: 2022-08-24

## 2022-08-23 MED ORDER — MAGNESIUM SULFATE 1 GRAM/100 ML IN D5W IVPB PREMIX -Q30MIN X4 DEFAULT
4.0000 g | INJECTION | Freq: Once | INTRAVENOUS | Status: AC
Start: 2022-08-23 — End: 2022-08-23
  Administered 2022-08-23: 4 g via INTRAVENOUS
  Administered 2022-08-23 (×2): 0 g via INTRAVENOUS

## 2022-08-23 MED ORDER — PROCHLORPERAZINE EDISYLATE 10 MG/2 ML (5 MG/ML) INJECTION SOLUTION
INTRAMUSCULAR | Status: AC
Start: 2022-08-23 — End: 2022-08-23
  Filled 2022-08-23: qty 2

## 2022-08-23 MED ORDER — OLANZAPINE 5 MG TABLET
5.0000 mg | ORAL_TABLET | Freq: Once | ORAL | Status: AC
Start: 2022-08-23 — End: 2022-08-23
  Administered 2022-08-23: 5 mg via ORAL

## 2022-08-23 MED ORDER — HYDROCORTISONE SOD SUCCINATE 100 MG/2 ML VIAL WRAPPER
100.0000 mg | Freq: Once | INTRAMUSCULAR | Status: DC | PRN
Start: 2022-08-23 — End: 2022-08-24

## 2022-08-23 MED ORDER — MAGNESIUM SULFATE 1 GRAM/100 ML IN DEXTROSE 5 % INTRAVENOUS PIGGYBACK
INJECTION | INTRAVENOUS | Status: AC
Start: 2022-08-23 — End: 2022-08-23
  Filled 2022-08-23: qty 400

## 2022-08-23 NOTE — Nurses Notes (Addendum)
0815-Patient to room 415 ambulatory.  Patient remains fall risk due to weakness and fatigue.  Fall risk armband applied.  Patient also reports nausea but not affecting intake now.  Patient's lungs are clear and bowel sounds are active.  No edema or swelling.Salli Quarry, RN    Patient Assessment/Symptom Management Patient Has MD Appointment Today   Key: (+) Symptom present           (-)  Symptom not present If Symptom is Positive(+) a Nursing Note is required   Edema -   Uncontrolled Nausea -   Vomiting -   Inability to eat/drink -   Mouth Sores -   Diarrhea -   Constipation (? Last BM) -   Fatigue that interferes with ADL's + Provider aware.   Numbness/Tingling -change + Provider aware.   Other -   Fever/Signs & Symptoms of infection -   Nurse Initials KB     0832-Port accessed and flushed per protocol.  Blood return obtained.Salli Quarry, RN  0853-Magnesium infusion started per standing order: 4 grams for magnesium less than 1.6.Salli Quarry, RN  0933-Aloxi, Zyprexa, and EMEND infusion.Salli Quarry, RN  1015-Magnesium infusion paused due to compatibility.Salli Quarry, RN  1016-KEYTRUDA infusion started.Salli Quarry, RN  1046-KEYTRUDA infusion stopped.  Patient tolerated well.Salli Quarry, RN  1112-CYTOXAN infusion started and Magnesium infusion resumed.Salli Quarry, RN  1142-CYTOXAN infusion stopped.Salli Quarry, RN  1236-Magnesium infusion stopped.Salli Quarry, RN  1236-ADRIAMYCIN push given per protocol.Salli Quarry, RN  1255-Port flushed per protocol and deaccessed.  Blood return obtained.Salli Quarry, RN  1300-Patient checking out at this time.Salli Quarry, RN

## 2022-08-23 NOTE — Cancer Center Note (Signed)
Department of Hematology/Oncology  History and Physical    Name: Rachel Mendoza  ZOX:[id number]  Date of Birth: [birth year]  Encounter Date: 08/23/2022    REFERRING PROVIDER:  No referring provider defined for this encounter.    REASON FOR OFFICE VISIT:  Follow up for evaluation and management of triple negative breast cancer.    HISTORY OF PRESENT ILLNESS:  Rachel Mendoza is a 47 y.o. female who presents today for initial medical oncology consultation regarding triple negative breast cancer.  The patient discovered a lump which was evaluated with mammogram, ultrasound, and biopsy.  The primary lesion measures in the range of 2.2-2.9 cm, and there were some nearby lymph nodes that were described as prominent although not obviously involved.  The narrow measurement for the lymph nodes was in the range of 1.1 cm.    The biopsy showed invasive ductal malignancy that was triple negative.  BRCA testing is currently pending.  Based on the above information, she was referred for consideration of neoadjuvant chemotherapy.    05/10/2022: The patient is here for follow up of triple negative breast cancer.  We still do not have the BRCA result.  We have also been trying to obtain a pretreatment PET-CT, but have not been able to obtain approval for it yet.    05/31/2022: The patient is here for follow up of triple negative breast cancer.  She states that she has noticed a dramatic improvement in the size of the lesion.  She states that she struggles to palpate it now.    06/21/2022: The patient is here for follow up of triple negative breast cancer.  She states that she is having fatigue and various other issues related to treatment.    07/12/2022: The patient is here to start her 4th cycle of Paclitaxel, Carboplatin, and Keytruda. This will be her final cycle of Paclitaxel and Carboplatin. She will start Adriamycin and Cytoxan along with Keytruda in cycle 5. She states that she is still have significant fatigue,  neuropathy, nausea, abdominal cramping and the various other issues she has complained of in the past. She complains that the Zofran does not help at all but the Compazine does help a little but she still remains nauseous most of the time for about 4 days after chemotherapy. We discussed that I would try to send in an additional medication to see if it helps better with the nausea post chemotherapy.     08/01/2022: The patient is here for follow up for triple negative breast cancer. She completed her Paclitaxel and Carboplatin and today she will receive Adriamycin, Cytoxan and Keytruda. She complains of increased neuropathy since her last visit and is hoping this will get better since she has completed Paclitaxel. She is concerned about not having treatment weekly since she has had low magnesium levels every week. We discussed that we could recheck her labs in 1 week to see if she may need IV magnesium. She was offered to go to [facility] for labs but she would like to see what her labs look like today and possible come here for them so she will not have to get out two different days.     08/23/2022: The patient is here for follow up for triple negative breast cancer. She completed her Paclitaxel and Carboplatin and today she will receive Adriamycin, Cytoxan and Keytruda. She states that she had felt like quitting after her last treatment. She states that she is feeling better now but had about 10 days  that she felt really bad. She continues to take nausea medicine but has not needed any for the past 3 days. She is ready to try the treatment again today. We discussed that we can continue to monitor her symptoms and possible adjust a dose or postpone a week if needed but not to give up.     ROS:   Review of Systems   Constitutional:  Positive for appetite change, chills, fatigue and fever ("low grade").   HENT:   Positive for mouth sores (not at this time but for several days after treatment).    Gastrointestinal:   Positive for abdominal pain, diarrhea and nausea.   Musculoskeletal:  Positive for arthralgias.        HISTORY:  Past Medical History:   Diagnosis Date    Allergic rhinitis     Anal fissure     Asthma     Bradycardia     Depression     Esophageal reflux     Hx of breast cancer     Hypersomnia     Hypertension     Hypothyroidism     Insomnia, unspecified     Kidney stones 06/06/2021    Nerve damage     Obesity, unspecified     Sleep apnea     Tachycardia, unspecified          Past Surgical History:   Procedure Laterality Date    HX BACK SURGERY      HX BREAST BIOPSY Right     HX HAND SURGERY Left     left thumb    HX HYSTERECTOMY      HX LITHOTRIPSY      HX TONSILLECTOMY      HX TUBAL LIGATION Bilateral     PORTACATH PLACEMENT           Social History     Socioeconomic History    Marital status: Married     Spouse name: Not on file    Number of children: Not on file    Years of education: Not on file    Highest education level: Not on file   Occupational History    Not on file   Tobacco Use    Smoking status: Every Day     Current packs/day: 0.50     Average packs/day: 0.5 packs/day for 36.4 years (18.2 ttl pk-yrs)     Types: Cigarettes     Start date: 04/08/1986     Passive exposure: Never    Smokeless tobacco: Never   Vaping Use    Vaping status: Never Used   Substance and Sexual Activity    Alcohol use: Not Currently     Comment: occasionally    Drug use: Yes     Types: Marijuana     Comment: CBD Gummies    Sexual activity: Not on file   Other Topics Concern    Not on file   Social History Narrative    Not on file     Social Determinants of Health     Financial Resource Strain: Not on file   Transportation Needs: Not on file   Social Connections: Not on file   Intimate Partner Violence: Not on file   Housing Stability: Not on file     Family Medical History:       Problem Relation (Age of Onset)    Asthma Brother, Maternal Grandmother    Breast Cancer Paternal Grandmother    Diabetes type II Father, Paternal  Grandmother    Elevated Lipids Father    Heart Disease Father    Hypertension (High Blood Pressure) Mother, Father    No Known Problems Sister, Maternal Aunt, Maternal Uncle, Paternal Aunt, Paternal Uncle, Maternal Grandfather, Paternal Grandfather, Daughter, Son, Other            Current Outpatient Medications   Medication Sig    albuterol sulfate (PROVENTIL OR VENTOLIN OR PROAIR) 90 mcg/actuation Inhalation oral inhaler Take 1-2 Puffs by inhalation Every 6 hours as needed    aprepitant (EMEND) 80 mg Oral Capsule Take 1 Capsule (80 mg total) by mouth Once a day Indications: prevent nausea and vomiting from cancer chemotherapy    beclomethasone dipropionate (QVAR REDIHALER) 80 mcg/actuation Inhalation oral inhaler Take 1 Puff by inhalation Twice daily    dicyclomine (BENTYL) 20 mg Oral Tablet Take 1 Tablet (20 mg total) by mouth Four times a day    dilTIAZem (CARDIZEM CD) 120 mg Oral Capsule, Sust. Release 24 hr Take 1 Capsule (120 mg total) by mouth Once a day    gabapentin (NEURONTIN) 300 mg Oral Capsule Take 1 Capsule (300 mg total) by mouth Four times a day    hydroCHLOROthiazide (HYDRODIURIL) 25 mg Oral Tablet Take 1 Tablet (25 mg total) by mouth Once a day    levothyroxine (SYNTHROID) 150 mcg Oral Tablet Take 1 Tablet (150 mcg total) by mouth Every morning    LIDOCAINE VISCOUS 2 % Mucous Membrane Solution Once per day as needed    loratadine (CLARITIN) 10 mg Oral Tablet Take 1 Tablet (10 mg total) by mouth Once a day    losartan (COZAAR) 50 mg Oral Tablet Take 1 Tablet (50 mg total) by mouth Once a day    Magic Mouthwash Swish and spit 10 mL Every 2 hours as needed for Sore throat Contains lidocaine viscous 2%, diphenhydramine 12.5 mg/5 ml, Maalox. Mix 1:1:1    magnesium Oxide 420 mg Oral Tablet Take 486 mg by mouth Twice daily    miconazole nitrate (SECURA) 2 % Cream Apply topically Twice daily    montelukast (SINGULAIR) 10 mg Oral Tablet Take 1 Tablet (10 mg total) by mouth Once a day    nystatin  (MYCOSTATIN) 100,000 unit/mL Oral Suspension Take 5 mL by mouth Four times a day    omeprazole (PRILOSEC) 20 mg Oral Capsule, Delayed Release(E.C.) Take 1 Capsule (20 mg total) by mouth Once a day    ondansetron (ZOFRAN) 8 mg Oral Tablet Take 1 Tablet (8 mg total) by mouth Every 8 hours as needed for Nausea/Vomiting    potassium chloride (K-DUR) 20 mEq Oral Tab Sust.Rel. Particle/Crystal Take 1 Tablet (20 mEq total) by mouth Once a day    prochlorperazine (COMPAZINE) 10 mg Oral Tablet Take 1 Tablet (10 mg total) by mouth Four times a day as needed for Nausea/Vomiting    promethazine (PHENERGAN) 25 mg Oral Tablet Take 1 Tablet (25 mg total) by mouth Every 6 hours as needed for Nausea/Vomiting    sertraline (ZOLOFT) 100 mg Oral Tablet Take 1 Tablet (100 mg total) by mouth Once a day     Allergies   Allergen Reactions    Dexamethasone (Pf) Swelling    Tamsulosin Nausea/ Vomiting       PHYSICAL EXAM:  BP (!) 144/79 (Site: Left, Patient Position: Sitting, Cuff Size: Adult)   Pulse (!) 132   Temp 36.6 C (97.8 F) (Temporal)   Ht 1.651 m (5\' 5" )   Wt 122 kg (268 lb)  SpO2 95%   BMI 44.60 kg/m        ECOG Status: (0) Fully active, able to carry on all predisease performance without restriction   Physical Exam  HENT:      Mouth/Throat:      Mouth: Mucous membranes are moist.      Pharynx: Oropharynx is clear.   Cardiovascular:      Rate and Rhythm: Normal rate and regular rhythm.      Pulses: Normal pulses.      Heart sounds: Normal heart sounds.   Pulmonary:      Effort: Pulmonary effort is normal.      Breath sounds: Wheezing present.   Abdominal:      Palpations: Abdomen is soft.   Skin:     General: Skin is warm and dry.   Neurological:      Mental Status: She is alert and oriented to person, place, and time.         DIAGNOSTIC DATA:  Recent Results (from the past [id number] hour(s))   CT CHEST ABDOMEN PELVIS W IV CONTRAST    Collection Time: 06/03/22 12:10 PM    Narrative    Modena Morrow Ginley    RADIOLOGIST: Alvester Chou, MD    CT CHEST ABDOMEN PELVIS W IV CONTRAST performed on 06/03/2022 12:10 PM    CLINICAL HISTORY: C50.411: Malignant neoplasm of upper-outer quadrant of right breast in female, estrogen receptor negative (CMS HCC)   Z17.1: Malignant neoplasm of upper-outer quadrant of right breast in female, estrogen receptor negative (CMS HCC) .  Initial staging breast cancer, patient has had 6 chemo treatments. Patient has a port, and a hx of a hysterectomy.    TECHNIQUE:  Chest, abdomen and pelvis CT with intravenous contrast. Oral contrast was also used.  CONTRAST:  75 ml's of Omnipaque 350    COMPARISON:  CT abdomen and pelvis 11/19/2019  # of known CTs in the past 12 months:  0   # of known Cardiac Nuclear Medicine Studies in the past 12 months:  0    FINDINGS:  CT CHEST:  Hardware:  There is a right internal jugular catheter in the SVC    Lymph nodes:   No mediastinal, hilar, or axillary lymphadenopathy.    Heart and Vasculature:  Normal heart size.  No pericardial effusion. Thoracic aorta and pulmonary arteries are unremarkable.      Lungs and Airways:  The lungs are normally expanded and clear.    Pleura: No pleural effusion.  No pneumothorax.    Bones: Bone windows are unremarkable.  There is a 3 cm oval-shaped density in the upper-outer quadrant in the right breast likely related to the patient's known breast cancer.    CT ABDOMEN/PELVIS:  Liver:   Diffuse fatty infiltration.    Gallbladder:   Unremarkable.    Spleen:   Unremarkable.    Pancreas:   Unremarkable.    Adrenals:   Unremarkable.    Kidneys:   There is a 6 mm nonobstructing stone in the right kidney.    Bladder:  Decompressed and poorly evaluated    Uterus and Adnexa:  Prior hysterectomy.  Adnexal regions are unremarkable.    Bowel:   Unremarkable.    Appendix:  Not visualized    Lymph nodes:  No suspicious lymph node enlargement.    Vasculature:   Major vascular structures are unremarkable.     Peritoneum / Retroperitoneum: No ascites.  No free  air.    Bones:  Postsurgical changes in the lumbar spine          Impression    1. NO EVIDENCE OF METASTATIC DISEASE IN THE CHEST ABDOMEN OR PELVIS  2. 3 CM DENSITY UPPER OUTER QUADRANT RIGHT BREAST CORRESPONDS TO THE PATIENT'S KNOWN BREAST CANCER.       One or more dose reduction techniques were used (e.g., Automated exposure control, adjustment of the mA and/or kV according to patient size, use of iterative reconstruction technique).      Radiologist location ID: [id number]          LABS:   CBC  Diff   Lab Results   Component Value Date/Time    WBC 7.9 08/23/2022 07:56 AM    HGB 9.3 (L) 08/23/2022 07:56 AM    HCT 26.7 (L) 08/23/2022 07:56 AM    PLTCNT 299 08/23/2022 07:56 AM    RBC 2.58 (L) 08/23/2022 07:56 AM    MCV 103.4 (H) 08/23/2022 07:56 AM    MCHC 34.8 08/23/2022 07:56 AM    MCH 36.0 (H) 08/23/2022 07:56 AM    RDW 24.0 (H) 08/23/2022 07:56 AM    MPV 7.5 (L) 08/23/2022 07:56 AM    Lab Results   Component Value Date/Time    PMNS 38 (L) 08/16/2022 09:29 AM    LYMPHOCYTES 43 08/16/2022 09:29 AM    EOSINOPHIL 1 08/16/2022 09:29 AM    MONOCYTES 18 (H) 08/16/2022 09:29 AM    BASOPHILS 1 08/16/2022 09:29 AM    BASOPHILS 0.00 08/16/2022 09:29 AM    PMNABS 1.10 (L) 08/16/2022 09:29 AM    LYMPHSABS 1.20 08/16/2022 09:29 AM    EOSABS 0.00 08/16/2022 09:29 AM    MONOSABS 0.50 08/16/2022 09:29 AM        ASSESSMENT:    ICD-10-CM    1. Malignant neoplasm of upper-outer quadrant of right breast in female, estrogen receptor negative (CMS HCC)  C50.411     Z17.1       2. Hypomagnesemia  E83.42             PLAN:   Triple negative breast cancer:  We previously reviewed the staging information with the patient, and reviewed the NCCN guidelines.  She is either T2 N0 M0 or T2 N1 M0.  Either one of those would be categorized as stage II.  According to NCCN guidelines, this falls into the high-risk triple negative category. BRCA mutation was negative. Continue current treatment.     Rachel Mendoza was given the chance to ask  questions, and these were answered to their satisfaction. The patient is welcome to call with any questions or concerns in the meantime.     On the day of the encounter, a total of 30 minutes was spent on this patient encounter including review of historical information, examination, documentation and post-visit activities.   Return in about 3 weeks (around 09/13/2022).     Benjaman Pott, APRN,FNP-BC ,08/23/2022 ,09:00     The patient's insurance company bears full legal and financial responsibility resulting from any deviations that they cause to my recommended treatment plan.     CC:  [name], FNP-BC  [address]  [place] [address]    No referring provider defined for this encounter.    This note was partially generated using MModal Fluency Direct system, and there may be some incorrect words, spellings, and punctuation that were not noted in checking the note before saving.

## 2022-08-26 ENCOUNTER — Other Ambulatory Visit (HOSPITAL_COMMUNITY): Payer: Self-pay | Admitting: NURSE PRACTITIONER

## 2022-08-26 ENCOUNTER — Telehealth (RURAL_HEALTH_CENTER): Payer: Self-pay | Admitting: Family

## 2022-08-26 DIAGNOSIS — R112 Nausea with vomiting, unspecified: Secondary | ICD-10-CM

## 2022-08-26 NOTE — Telephone Encounter (Signed)
-----   Message from [name], FNP-BC sent at 08/25/2022  9:19 PM EDT -----  Let her know I saw her TSH.  We will plan to stay on the same dose for now as it has only been 2 weeks since we last changed it.  Hopefully with the next set of labs it will improve.

## 2022-08-26 NOTE — Telephone Encounter (Signed)
Left message for patient to return call to the office.

## 2022-08-27 ENCOUNTER — Ambulatory Visit (RURAL_HEALTH_CENTER): Payer: Self-pay | Admitting: Family

## 2022-08-27 NOTE — Telephone Encounter (Signed)
Patient was notified.

## 2022-08-30 ENCOUNTER — Ambulatory Visit (HOSPITAL_COMMUNITY): Payer: Commercial Managed Care - PPO

## 2022-08-30 ENCOUNTER — Other Ambulatory Visit (HOSPITAL_COMMUNITY): Payer: Self-pay | Admitting: NURSE PRACTITIONER

## 2022-09-06 ENCOUNTER — Other Ambulatory Visit (HOSPITAL_COMMUNITY): Payer: Self-pay | Admitting: NURSE PRACTITIONER

## 2022-09-06 ENCOUNTER — Ambulatory Visit (HOSPITAL_COMMUNITY): Payer: Commercial Managed Care - PPO

## 2022-09-13 ENCOUNTER — Other Ambulatory Visit: Payer: Self-pay

## 2022-09-13 ENCOUNTER — Encounter (INDEPENDENT_AMBULATORY_CARE_PROVIDER_SITE_OTHER): Payer: Self-pay | Admitting: HEMATOLOGY-ONCOLOGY

## 2022-09-13 ENCOUNTER — Ambulatory Visit (INDEPENDENT_AMBULATORY_CARE_PROVIDER_SITE_OTHER)
Admission: RE | Admit: 2022-09-13 | Discharge: 2022-09-13 | Disposition: A | Discharge status: Home / Self Care | Payer: Commercial Managed Care - PPO | Source: Ambulatory Visit | Attending: HEMATOLOGY-ONCOLOGY | Admitting: HEMATOLOGY-ONCOLOGY

## 2022-09-13 ENCOUNTER — Encounter (HOSPITAL_COMMUNITY): Payer: Self-pay

## 2022-09-13 ENCOUNTER — Ambulatory Visit: Payer: Commercial Managed Care - PPO | Admitting: HEMATOLOGY-ONCOLOGY

## 2022-09-13 ENCOUNTER — Ambulatory Visit
Admission: RE | Admit: 2022-09-13 | Discharge: 2022-09-13 | Disposition: A | Discharge status: Home / Self Care | Payer: Commercial Managed Care - PPO | Source: Ambulatory Visit | Attending: HEMATOLOGY-ONCOLOGY | Admitting: HEMATOLOGY-ONCOLOGY

## 2022-09-13 VITALS — BP 131/98 | HR 112 | Temp 97.4°F | Ht 65.0 in | Wt 264.0 lb

## 2022-09-13 VITALS — BP 110/65 | HR 89 | Temp 97.9°F | Resp 18

## 2022-09-13 DIAGNOSIS — C50411 Malignant neoplasm of upper-outer quadrant of right female breast: Secondary | ICD-10-CM

## 2022-09-13 DIAGNOSIS — Z171 Estrogen receptor negative status [ER-]: Secondary | ICD-10-CM

## 2022-09-13 DIAGNOSIS — Z5112 Encounter for antineoplastic immunotherapy: Secondary | ICD-10-CM | POA: Insufficient documentation

## 2022-09-13 LAB — COMPREHENSIVE METABOLIC PANEL, NON-FASTING
ALBUMIN/GLOBULIN RATIO: 1.2 (ref 0.8–1.4)
ALBUMIN: 4.2 g/dL (ref 3.5–5.7)
ALKALINE PHOSPHATASE: 102 U/L (ref 34–104)
ALT (SGPT): 17 U/L (ref 7–52)
ANION GAP: 10 mmol/L (ref 4–13)
AST (SGOT): 20 U/L (ref 13–39)
BILIRUBIN TOTAL: 0.3 mg/dL (ref 0.3–1.2)
BUN/CREA RATIO: 20 (ref 6–22)
BUN: 19 mg/dL (ref 7–25)
CALCIUM, CORRECTED: 9.6 mg/dL (ref 8.9–10.8)
CALCIUM: 9.8 mg/dL (ref 8.6–10.3)
CHLORIDE: 101 mmol/L (ref 98–107)
CO2 TOTAL: 27 mmol/L (ref 21–31)
CREATININE: 0.96 mg/dL (ref 0.60–1.30)
ESTIMATED GFR: 73 mL/min/{1.73_m2} (ref >59)
GLOBULIN: 3.5 (ref 2.9–5.4)
GLUCOSE: 136 mg/dL — ABNORMAL HIGH (ref 74–109)
OSMOLALITY, CALCULATED: 280 mOsm/kg (ref 270–290)
POTASSIUM: 3.4 mmol/L — ABNORMAL LOW (ref 3.5–5.1)
PROTEIN TOTAL: 7.7 g/dL (ref 6.4–8.9)
SODIUM: 138 mmol/L (ref 136–145)

## 2022-09-13 LAB — CBC WITH DIFF
BASOPHIL #: 0.1 10*3/uL (ref 0.00–0.10)
BASOPHIL %: 1 % (ref 0–1)
EOSINOPHIL #: 0.1 10*3/uL (ref 0.00–0.50)
EOSINOPHIL %: 1 %
HCT: 29.8 % — ABNORMAL LOW (ref 31.2–41.9)
HGB: 10.4 g/dL — ABNORMAL LOW (ref 10.9–14.3)
LYMPHOCYTE #: 1.6 10*3/uL (ref 1.00–3.00)
LYMPHOCYTE %: 18 % (ref 16–44)
MCH: 36.7 pg — ABNORMAL HIGH (ref 24.7–32.8)
MCHC: 34.8 g/dL (ref 32.3–35.6)
MCV: 105.3 fL — ABNORMAL HIGH (ref 75.5–95.3)
MONOCYTE #: 0.7 10*3/uL (ref 0.30–1.00)
MONOCYTE %: 8 % (ref 5–13)
MPV: 7.1 fL — ABNORMAL LOW (ref 7.9–10.8)
NEUTROPHIL #: 6.6 10*3/uL (ref 1.85–7.80)
NEUTROPHIL %: 73 % (ref 43–77)
PLATELETS: 327 10*3/uL (ref 140–440)
RBC: 2.83 10*6/uL — ABNORMAL LOW (ref 3.63–4.92)
RDW: 17.3 % (ref 12.3–17.7)
WBC: 9 10*3/uL (ref 3.8–11.8)

## 2022-09-13 LAB — MAGNESIUM: MAGNESIUM: 1.5 mg/dL — ABNORMAL LOW (ref 1.9–2.7)

## 2022-09-13 LAB — THYROID STIMULATING HORMONE WITH FREE T4 REFLEX: TSH: 16.243 u[IU]/mL — ABNORMAL HIGH (ref 0.450–5.330)

## 2022-09-13 MED ORDER — LORAZEPAM 0.5 MG TABLET
0.5000 mg | ORAL_TABLET | Freq: Four times a day (QID) | ORAL | Status: DC | PRN
Start: 2022-09-13 — End: 2022-09-14

## 2022-09-13 MED ORDER — SODIUM CHLORIDE 0.9 % INTRAVENOUS SOLUTION
600.0000 mg/m2 | Freq: Once | INTRAVENOUS | Status: AC
Start: 2022-09-13 — End: 2022-09-13
  Administered 2022-09-13: 0 mg via INTRAVENOUS
  Administered 2022-09-13: 1420 mg via INTRAVENOUS
  Filled 2022-09-13: qty 25

## 2022-09-13 MED ORDER — DIPHENHYDRAMINE 50 MG/ML INJECTION SOLUTION
50.0000 mg | Freq: Once | INTRAMUSCULAR | Status: DC | PRN
Start: 2022-09-13 — End: 2022-09-14

## 2022-09-13 MED ORDER — ALBUTEROL SULFATE 2.5 MG/3 ML (0.083 %) SOLUTION FOR NEBULIZATION
2.5000 mg | INHALATION_SOLUTION | Freq: Once | RESPIRATORY_TRACT | Status: DC | PRN
Start: 2022-09-13 — End: 2022-09-14

## 2022-09-13 MED ORDER — MAGNESIUM SULFATE 1 GRAM/100 ML IN DEXTROSE 5 % INTRAVENOUS PIGGYBACK
1.0000 g | INJECTION | INTRAVENOUS | Status: AC
Start: 2022-09-13 — End: 2022-09-13
  Administered 2022-09-13: 1 g via INTRAVENOUS
  Administered 2022-09-13: 0 g via INTRAVENOUS
  Administered 2022-09-13: 1 g via INTRAVENOUS
  Administered 2022-09-13: 0 g via INTRAVENOUS

## 2022-09-13 MED ORDER — PROCHLORPERAZINE EDISYLATE 10 MG/2 ML (5 MG/ML) INJECTION SOLUTION
10.0000 mg | Freq: Four times a day (QID) | INTRAMUSCULAR | Status: DC | PRN
Start: 2022-09-13 — End: 2022-09-14

## 2022-09-13 MED ORDER — EPINEPHRINE HCL (PF) 1 MG/ML (1 ML) INJECTION SOLUTION
0.3000 mg | Freq: Once | INTRAMUSCULAR | Status: DC | PRN
Start: 2022-09-13 — End: 2022-09-14

## 2022-09-13 MED ORDER — DEXTROSE 5% IN WATER (D5W) FLUSH BAG - 250 ML
INTRAVENOUS | Status: AC | PRN
Start: 2022-09-13 — End: ?

## 2022-09-13 MED ORDER — LORAZEPAM 2 MG/ML INJECTION WRAPPER
0.5000 mg | Freq: Four times a day (QID) | INTRAMUSCULAR | Status: DC | PRN
Start: 2022-09-13 — End: 2022-09-14

## 2022-09-13 MED ORDER — DOXORUBICIN 2 MG/ML INTRAVENOUS SOLUTION
60.0000 mg/m2 | Freq: Once | INTRAVENOUS | Status: AC
Start: 2022-09-13 — End: 2022-09-13
  Administered 2022-09-13: 141.6 mg via INTRAVENOUS
  Filled 2022-09-13: qty 70.8

## 2022-09-13 MED ORDER — SODIUM CHLORIDE 0.9 % INTRAVENOUS SOLUTION
150.0000 mg | Freq: Once | INTRAVENOUS | Status: AC
Start: 2022-09-13 — End: 2022-09-13
  Administered 2022-09-13: 0 mg via INTRAVENOUS
  Administered 2022-09-13: 150 mg via INTRAVENOUS
  Filled 2022-09-13: qty 5

## 2022-09-13 MED ORDER — FAMOTIDINE (PF) 20 MG/2 ML INTRAVENOUS SOLUTION
20.0000 mg | Freq: Once | INTRAVENOUS | Status: DC | PRN
Start: 2022-09-13 — End: 2022-09-14

## 2022-09-13 MED ORDER — SODIUM CHLORIDE 0.9 % INTRAVENOUS SOLUTION
200.0000 mg | Freq: Once | INTRAVENOUS | Status: AC
Start: 2022-09-13 — End: 2022-09-13
  Administered 2022-09-13: 0 mg via INTRAVENOUS
  Administered 2022-09-13: 200 mg via INTRAVENOUS
  Filled 2022-09-13: qty 8

## 2022-09-13 MED ORDER — PALONOSETRON 0.25 MG/5 ML INTRAVENOUS SOLUTION
INTRAVENOUS | Status: AC
Start: 2022-09-13 — End: 2022-09-13
  Filled 2022-09-13: qty 5

## 2022-09-13 MED ORDER — SODIUM CHLORIDE 0.9% FLUSH BAG - 250 ML
INTRAVENOUS | Status: DC | PRN
Start: 2022-09-13 — End: 2022-09-14

## 2022-09-13 MED ORDER — PROCHLORPERAZINE MALEATE 10 MG TABLET
10.0000 mg | ORAL_TABLET | Freq: Four times a day (QID) | ORAL | Status: DC | PRN
Start: 2022-09-13 — End: 2022-09-14

## 2022-09-13 MED ORDER — MEPERIDINE (PF) 25 MG/ML INJECTION SOLUTION
12.5000 mg | Freq: Once | INTRAMUSCULAR | Status: DC | PRN
Start: 2022-09-13 — End: 2022-09-14

## 2022-09-13 MED ORDER — ALBUTEROL SULFATE HFA 90 MCG/ACTUATION AEROSOL INHALER - RN
2.0000 | Freq: Once | RESPIRATORY_TRACT | Status: DC | PRN
Start: 2022-09-13 — End: 2022-09-14

## 2022-09-13 MED ORDER — OLANZAPINE 5 MG TABLET
5.0000 mg | ORAL_TABLET | Freq: Once | ORAL | Status: DC
Start: 2022-09-13 — End: 2022-09-14
  Administered 2022-09-13: 0 mg via ORAL

## 2022-09-13 MED ORDER — PALONOSETRON 0.25 MG/5 ML INTRAVENOUS SOLUTION
0.2500 mg | Freq: Once | INTRAVENOUS | Status: AC
Start: 2022-09-13 — End: 2022-09-13
  Administered 2022-09-13: 0.25 mg via INTRAVENOUS

## 2022-09-13 MED ORDER — MAGNESIUM SULFATE 1 GRAM/100 ML IN DEXTROSE 5 % INTRAVENOUS PIGGYBACK
INJECTION | INTRAVENOUS | Status: AC
Start: 2022-09-13 — End: 2022-09-13
  Filled 2022-09-13: qty 200

## 2022-09-13 MED ORDER — HYDROCORTISONE SOD SUCCINATE 100 MG/2 ML VIAL WRAPPER
100.0000 mg | Freq: Once | INTRAMUSCULAR | Status: DC | PRN
Start: 2022-09-13 — End: 2022-09-14

## 2022-09-13 MED ORDER — DIPHENHYDRAMINE 50 MG/ML INJECTION SOLUTION
25.0000 mg | Freq: Once | INTRAMUSCULAR | Status: DC | PRN
Start: 2022-09-13 — End: 2022-09-14

## 2022-09-13 NOTE — Nurses Notes (Addendum)
0848 Pt ambulatory to OP Onc unit with spouse. Appt with Dr.Mackey complete and labs drawn peripherally prior to appt. Pt denies c/o. Immunotherapy Flow Sheet completed. Felecia Jan, RN  [phone] Port accessed with ease. Excellent blood return noted and flushes easily. Pt states she does not want Zyprexa po. States it makes her very drowsy and hard to function for 2 days after taking. Just wants to sleep all the time. TSH 16.243 Pt states her primary care provider [name] NP) is managing. Felecia Jan, RN  [phone] Aloxi 0.25mg  IVP given.Felecia Jan, RN  [phone] Emend 150 mg IV infusion started. Felecia Jan, RN  [phone] Emend infusion completed. Line flushed with NS. Felecia Jan, RN  [phone] Keytruda 200 mg IV infusion started. Felecia Jan, RN  [phone] Mag 1.5.Felecia Jan, RN  [phone] Keytruda infusion completed. Line flushed with NS. Felecia Jan, RN  1024 Mag Sulfate 2 G IV infusion started. Felecia Jan, RN  [phone] Cytoxan 1,420 mg IV infusion started. Felecia Jan, RN  1055 Cytoxan infusion completed. Line flushed with NS. Felecia Jan, RN  [phone] Mag Sulfate infusion completed. Line flushed with NS. Felecia Jan, RN  [phone] Adriamycin 141.6 mg slow IVP given over 15 min via side arm of rapidly infusing IV fluids. Excellent blood return noted before, during and after Adriamycin administration. Pt tolerated without difficulty. Felecia Jan, RN  1200 Pt states she feels so much better than she has with the other txs. States the Zyprexa may her feel bad. Felecia Jan, RN  1225 VS obtained. Excellent blood return noted from port. Port flushed with 30 ml NS. Demetrios Isaacs d/c'ed. Site clear. Band aid applied. Felecia Jan, RN  1230 Pt left OP Onc unit ambulatory with spouse to assist. No adverse reaction noted. Felecia Jan, RN

## 2022-09-13 NOTE — Progress Notes (Unsigned)
Department of Hematology/Oncology  History and Physical    Name: Rachel Mendoza  ZOX:[id number]  Date of Birth: Jan 04, 1976  Encounter Date: 09/13/2022    TELEMEDICINE DOCUMENTATION:  Patient Location:  [facility], [facility] outpatient Hematology/Oncology [address] [place] [address]  Patient/family aware of provider location:  yes  Patient/family consent for telemedicine:  yes    REFERRING PROVIDER:  No referring provider defined for this encounter.    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    HISTORY OF PRESENT ILLNESS:  Rachel Mendoza is a 47 y.o. female who presents today for initial medical oncology consultation regarding triple negative breast cancer.  The patient discovered a lump which was evaluated with mammogram, ultrasound, and biopsy.  The primary lesion measures in the range of 2.2-2.9 cm, and there were some nearby lymph nodes that were described as prominent although not obviously involved.  The narrow measurement for the lymph nodes was in the range of 1.1 cm.    The biopsy showed invasive ductal malignancy that was triple negative.  BRCA testing is currently pending.  Based on the above information, she was referred for consideration of neoadjuvant chemotherapy.    05/10/2022: The patient is here for follow up of triple negative breast cancer.  We still do not have the BRCA result.  We have also been trying to obtain a pretreatment PET-CT, but have not been able to obtain approval for it yet.    05/31/2022: The patient is here for follow up of triple negative breast cancer.  She states that she has noticed a dramatic improvement in the size of the lesion.  She states that she struggles to palpate it now.    06/21/2022: The patient is here for follow up of triple negative breast cancer.  She states that she is having fatigue and various other issues related to treatment.    09/13/2022: The patient is here for  follow up of triple negative breast cancer. She states that she feels bad for approximately 10 days after chemotherapy treatments.     ROS:   Review of Systems   Constitutional:  Negative for appetite change, chills and fatigue.   HENT:   Negative for sore throat and trouble swallowing.    Eyes:  Negative for eye problems.   Respiratory:  Negative for cough and shortness of breath.    Cardiovascular:  Negative for chest pain and leg swelling.   Gastrointestinal:  Negative for abdominal pain.   Genitourinary:  Negative for dysuria and hematuria.    Musculoskeletal:  Negative for arthralgias and gait problem.   Skin:  Negative for rash.   Neurological:  Negative for gait problem.   Hematological:  Negative for adenopathy.   Psychiatric/Behavioral:  Negative for depression.         HISTORY:  Past Medical History:   Diagnosis Date    Allergic rhinitis     Anal fissure     Asthma     Bradycardia     Depression     Esophageal reflux     Hx of breast cancer     Hypersomnia     Hypertension     Hypothyroidism     Insomnia, unspecified     Kidney stones 06/06/2021    Nerve damage     Obesity, unspecified     Sleep apnea     Tachycardia, unspecified  Past Surgical History:   Procedure Laterality Date    HX BACK SURGERY      HX BREAST BIOPSY Right     HX HAND SURGERY Left     left thumb    HX HYSTERECTOMY      HX LITHOTRIPSY      HX TONSILLECTOMY      HX TUBAL LIGATION Bilateral     PORTACATH PLACEMENT           Social History     Socioeconomic History    Marital status: Married     Spouse name: Not on file    Number of children: Not on file    Years of education: Not on file    Highest education level: Not on file   Occupational History    Not on file   Tobacco Use    Smoking status: Every Day     Current packs/day: 0.50     Average packs/day: 0.5 packs/day for 36.4 years (18.2 ttl pk-yrs)     Types: Cigarettes     Start date: 04/08/1986     Passive exposure: Never    Smokeless tobacco: Never   Vaping Use    Vaping  status: Never Used   Substance and Sexual Activity    Alcohol use: Not Currently     Comment: occasionally    Drug use: Yes     Types: Marijuana     Comment: CBD Gummies    Sexual activity: Not on file   Other Topics Concern    Not on file   Social History Narrative    Not on file     Social Determinants of Health     Financial Resource Strain: Not on file   Transportation Needs: Not on file   Social Connections: Not on file   Intimate Partner Violence: Not on file   Housing Stability: Not on file     Family Medical History:       Problem Relation (Age of Onset)    Asthma Brother, Maternal Grandmother    Breast Cancer Paternal Grandmother    Diabetes type II Father, Paternal Grandmother    Elevated Lipids Father    Heart Disease Father    Hypertension (High Blood Pressure) Mother, Father    No Known Problems Sister, Maternal Aunt, Maternal Uncle, Paternal Aunt, Paternal Uncle, Maternal Grandfather, Paternal Grandfather, Daughter, Son, Other            Current Outpatient Medications   Medication Sig    albuterol sulfate (PROVENTIL OR VENTOLIN OR PROAIR) 90 mcg/actuation Inhalation oral inhaler Take 1-2 Puffs by inhalation Every 6 hours as needed    aprepitant (EMEND) 80 mg Oral Capsule Take 1 Capsule (80 mg total) by mouth Once a day Indications: prevent nausea and vomiting from cancer chemotherapy    beclomethasone dipropionate (QVAR REDIHALER) 80 mcg/actuation Inhalation oral inhaler Take 1 Puff by inhalation Twice daily    dicyclomine (BENTYL) 20 mg Oral Tablet Take 1 Tablet (20 mg total) by mouth Four times a day    dilTIAZem (CARDIZEM CD) 120 mg Oral Capsule, Sust. Release 24 hr Take 1 Capsule (120 mg total) by mouth Once a day    gabapentin (NEURONTIN) 300 mg Oral Capsule Take 1 Capsule (300 mg total) by mouth Four times a day    hydroCHLOROthiazide (HYDRODIURIL) 25 mg Oral Tablet Take 1 Tablet (25 mg total) by mouth Once a day    levothyroxine (SYNTHROID) 150 mcg Oral Tablet Take 1 Tablet (150  mcg total) by  mouth Every morning    LIDOCAINE VISCOUS 2 % Mucous Membrane Solution Once per day as needed    lidocaine-prilocaine (EMLA) 2.5-2.5 % Cream APPLY TOPICALLY AS A ONE-TIME DOSE    loratadine (CLARITIN) 10 mg Oral Tablet Take 1 Tablet (10 mg total) by mouth Once a day    losartan (COZAAR) 50 mg Oral Tablet Take 1 Tablet (50 mg total) by mouth Once a day    Magic Mouthwash Swish and spit 10 mL Every 2 hours as needed for Sore throat Contains lidocaine viscous 2%, diphenhydramine 12.5 mg/5 ml, Maalox. Mix 1:1:1    magnesium Oxide 420 mg Oral Tablet Take 486 mg by mouth Twice daily    miconazole nitrate (SECURA) 2 % Cream Apply topically Twice daily    montelukast (SINGULAIR) 10 mg Oral Tablet Take 1 Tablet (10 mg total) by mouth Once a day    nystatin (MYCOSTATIN) 100,000 unit/mL Oral Suspension Take 5 mL by mouth Four times a day    omeprazole (PRILOSEC) 20 mg Oral Capsule, Delayed Release(E.C.) Take 1 Capsule (20 mg total) by mouth Once a day    ondansetron (ZOFRAN) 8 mg Oral Tablet Take 1 Tablet (8 mg total) by mouth Every 8 hours as needed for Nausea/Vomiting    potassium chloride (K-DUR) 20 mEq Oral Tab Sust.Rel. Particle/Crystal Take 1 Tablet (20 mEq total) by mouth Once a day    prochlorperazine (COMPAZINE) 10 mg Oral Tablet Take 1 Tablet (10 mg total) by mouth Four times a day as needed for Nausea/Vomiting    promethazine (PHENERGAN) 25 mg Oral Tablet TAKE 1 TABLET BY MOUTH EVERY 6 HOURS AS NEEDED FOR NAUSEA AND VOMITING    sertraline (ZOLOFT) 100 mg Oral Tablet Take 1 Tablet (100 mg total) by mouth Once a day     Allergies   Allergen Reactions    Dexamethasone (Pf) Swelling    Tamsulosin Nausea/ Vomiting       PHYSICAL EXAM:  Most Recent Vitals    Flowsheet Row Telemedicine from 04/24/2022 in Hematology/Oncology,   [facility]   Temperature 36.6 C (97.8 F) filed at... 04/24/2022 1338   Heart Rate 104 filed at... 04/24/2022 1338   Respiratory Rate --   BP (Non-Invasive) 147/99 filed at...  04/24/2022 1338   SpO2 96 % filed at... 04/24/2022 1338   Height 1.651 m (5\' 5" ) filed at... 04/24/2022 1338   Weight 121 kg (266 lb 6.4 oz) filed at... 04/24/2022 1338   BMI (Calculated) 44.42 filed at... 04/24/2022 1338   BSA (Calculated) 2.35 filed at... 04/24/2022 1338      ECOG Status: (0) Fully active, able to carry on all predisease performance without restriction   Physical Exam    DIAGNOSTIC DATA:  No results found for this or any previous visit (from the past 09811 hour(s)).    LABS:   CBC  Diff   Lab Results   Component Value Date/Time    WBC 9.0 09/13/2022 07:47 AM    HGB 10.4 (L) 09/13/2022 07:47 AM    HCT 29.8 (L) 09/13/2022 07:47 AM    PLTCNT 327 09/13/2022 07:47 AM    RBC 2.83 (L) 09/13/2022 07:47 AM    MCV 105.3 (H) 09/13/2022 07:47 AM    MCHC 34.8 09/13/2022 07:47 AM    MCH 36.7 (H) 09/13/2022 07:47 AM    RDW 17.3 09/13/2022 07:47 AM    MPV 7.1 (L) 09/13/2022 07:47 AM    Lab Results   Component Value Date/Time  PMNS 73 09/13/2022 07:47 AM    LYMPHOCYTES 18 09/13/2022 07:47 AM    EOSINOPHIL 1 09/13/2022 07:47 AM    MONOCYTES 8 09/13/2022 07:47 AM    BASOPHILS 1 09/13/2022 07:47 AM    BASOPHILS 0.10 09/13/2022 07:47 AM    PMNABS 6.60 09/13/2022 07:47 AM    LYMPHSABS 1.60 09/13/2022 07:47 AM    EOSABS 0.10 09/13/2022 07:47 AM    MONOSABS 0.70 09/13/2022 07:47 AM            ASSESSMENT:  No diagnosis found.       PLAN:   1. All relevant medical records were reviewed including available pertinent provider notes, procedure notes, imaging, laboratory, and pathology.   2. All pertinent labs and/or imaging were reviewed with the patient.   3. Triple negative breast cancer:  We previously reviewed the staging information with the patient, and reviewed the NCCN guidelines.  She is either T2 N0 M0 or T2 N1 M0.  Either one of those would be categorized as stage II.  According to NCCN guidelines, this falls into the high-risk triple negative category. BRCA mutation status is negative. Continue current  treatment.     Rachel Mendoza was given the chance to ask questions, and these were answered to their satisfaction. The patient is welcome to call with any questions or concerns in the meantime.     On the day of the encounter, a total of 35 minutes was spent on this patient encounter including review of historical information, examination, documentation and post-visit activities.   Return in about 3 weeks (around 10/04/2022).     Lupita Dawn, MD  09/13/2022, 08:20  The patient was seen as part of a collaborative telemedicine service with Dr. Damita Lack who participated in the encounter by active presence via approved video/audio means for portions of the encounter.  The patient's insurance company bears full legal and financial responsibility resulting from any deviations that they cause to my recommended treatment plan.     CC:  [name], FNP-BC  [address]  [place] [address]    No referring provider defined for this encounter.    This note was partially generated using MModal Fluency Direct system, and there may be some incorrect words, spellings, and punctuation that were not noted in checking the note before saving.

## 2022-09-15 ENCOUNTER — Other Ambulatory Visit (RURAL_HEALTH_CENTER): Payer: Self-pay | Admitting: Family

## 2022-09-15 MED ORDER — LEVOTHYROXINE 175 MCG TABLET
175.0000 ug | ORAL_TABLET | Freq: Every morning | ORAL | 1 refills | Status: DC
Start: 2022-09-15 — End: 2023-01-31

## 2022-09-16 ENCOUNTER — Other Ambulatory Visit (HOSPITAL_COMMUNITY): Payer: Self-pay | Admitting: NURSE PRACTITIONER

## 2022-09-16 ENCOUNTER — Telehealth (RURAL_HEALTH_CENTER): Payer: Self-pay | Admitting: Family

## 2022-09-16 DIAGNOSIS — R112 Nausea with vomiting, unspecified: Secondary | ICD-10-CM

## 2022-09-16 NOTE — Telephone Encounter (Signed)
Patient notified

## 2022-09-16 NOTE — Telephone Encounter (Signed)
-----   Message from [name], FNP-BC sent at 09/15/2022  2:37 PM EDT -----  Let her know that I increased her dose of synthroid.

## 2022-10-04 ENCOUNTER — Other Ambulatory Visit: Payer: Self-pay

## 2022-10-04 ENCOUNTER — Ambulatory Visit (INDEPENDENT_AMBULATORY_CARE_PROVIDER_SITE_OTHER)
Admission: RE | Admit: 2022-10-04 | Discharge: 2022-10-04 | Disposition: A | Discharge status: Home / Self Care | Payer: Commercial Managed Care - PPO | Source: Ambulatory Visit | Attending: HEMATOLOGY-ONCOLOGY | Admitting: HEMATOLOGY-ONCOLOGY

## 2022-10-04 ENCOUNTER — Encounter (INDEPENDENT_AMBULATORY_CARE_PROVIDER_SITE_OTHER): Payer: Self-pay | Admitting: HEMATOLOGY-ONCOLOGY

## 2022-10-04 ENCOUNTER — Encounter (HOSPITAL_BASED_OUTPATIENT_CLINIC_OR_DEPARTMENT_OTHER): Payer: Self-pay

## 2022-10-04 ENCOUNTER — Ambulatory Visit
Admission: RE | Admit: 2022-10-04 | Discharge: 2022-10-04 | Disposition: A | Discharge status: Home / Self Care | Payer: Commercial Managed Care - PPO | Source: Ambulatory Visit | Attending: HEMATOLOGY-ONCOLOGY | Admitting: HEMATOLOGY-ONCOLOGY

## 2022-10-04 ENCOUNTER — Ambulatory Visit (HOSPITAL_BASED_OUTPATIENT_CLINIC_OR_DEPARTMENT_OTHER): Payer: Commercial Managed Care - PPO | Admitting: HEMATOLOGY-ONCOLOGY

## 2022-10-04 ENCOUNTER — Encounter (HOSPITAL_COMMUNITY): Payer: Self-pay

## 2022-10-04 VITALS — BP 134/92 | HR 126 | Temp 97.0°F | Ht 65.0 in | Wt 257.4 lb

## 2022-10-04 VITALS — BP 144/63 | HR 99 | Temp 97.5°F | Resp 17

## 2022-10-04 DIAGNOSIS — R42 Dizziness and giddiness: Secondary | ICD-10-CM | POA: Insufficient documentation

## 2022-10-04 DIAGNOSIS — Z5111 Encounter for antineoplastic chemotherapy: Secondary | ICD-10-CM | POA: Insufficient documentation

## 2022-10-04 DIAGNOSIS — C50411 Malignant neoplasm of upper-outer quadrant of right female breast: Secondary | ICD-10-CM

## 2022-10-04 DIAGNOSIS — Z171 Estrogen receptor negative status [ER-]: Secondary | ICD-10-CM | POA: Insufficient documentation

## 2022-10-04 DIAGNOSIS — Z5112 Encounter for antineoplastic immunotherapy: Secondary | ICD-10-CM | POA: Insufficient documentation

## 2022-10-04 DIAGNOSIS — Z79630 Long term (current) use of alkylating agent: Secondary | ICD-10-CM | POA: Insufficient documentation

## 2022-10-04 DIAGNOSIS — Z7969 Long term (current) use of other immunomodulators and immunosuppressants: Secondary | ICD-10-CM | POA: Insufficient documentation

## 2022-10-04 LAB — COMPREHENSIVE METABOLIC PANEL, NON-FASTING
ALBUMIN/GLOBULIN RATIO: 1.1 (ref 0.8–1.4)
ALBUMIN: 4.1 g/dL (ref 3.5–5.7)
ALKALINE PHOSPHATASE: 97 U/L (ref 34–104)
ALT (SGPT): 27 U/L (ref 7–52)
ANION GAP: 9 mmol/L (ref 4–13)
AST (SGOT): 23 U/L (ref 13–39)
BILIRUBIN TOTAL: 0.3 mg/dL (ref 0.3–1.2)
BUN/CREA RATIO: 20 (ref 6–22)
BUN: 17 mg/dL (ref 7–25)
CALCIUM, CORRECTED: 9.7 mg/dL (ref 8.9–10.8)
CALCIUM: 9.8 mg/dL (ref 8.6–10.3)
CHLORIDE: 102 mmol/L (ref 98–107)
CO2 TOTAL: 26 mmol/L (ref 21–31)
CREATININE: 0.84 mg/dL (ref 0.60–1.30)
ESTIMATED GFR: 86 mL/min/{1.73_m2} (ref >59)
GLOBULIN: 3.6 (ref 2.9–5.4)
GLUCOSE: 139 mg/dL — ABNORMAL HIGH (ref 74–109)
OSMOLALITY, CALCULATED: 278 mOsm/kg (ref 270–290)
POTASSIUM: 3 mmol/L — ABNORMAL LOW (ref 3.5–5.1)
PROTEIN TOTAL: 7.7 g/dL (ref 6.4–8.9)
SODIUM: 137 mmol/L (ref 136–145)

## 2022-10-04 LAB — CBC WITH DIFF
BASOPHIL #: 0.1 10*3/uL (ref 0.00–0.10)
BASOPHIL %: 1 % (ref 0–1)
EOSINOPHIL #: 0 10*3/uL (ref 0.00–0.50)
EOSINOPHIL %: 1 %
HCT: 32.3 % (ref 31.2–41.9)
HGB: 11.1 g/dL (ref 10.9–14.3)
LYMPHOCYTE #: 2 10*3/uL (ref 1.00–3.00)
LYMPHOCYTE %: 24 % (ref 16–44)
MCH: 36.1 pg — ABNORMAL HIGH (ref 24.7–32.8)
MCHC: 34.3 g/dL (ref 32.3–35.6)
MCV: 105.2 fL — ABNORMAL HIGH (ref 75.5–95.3)
MONOCYTE #: 0.5 10*3/uL (ref 0.30–1.00)
MONOCYTE %: 6 % (ref 5–13)
MPV: 7.6 fL — ABNORMAL LOW (ref 7.9–10.8)
NEUTROPHIL #: 5.5 10*3/uL (ref 1.85–7.80)
NEUTROPHIL %: 68 % (ref 43–77)
PLATELETS: 264 10*3/uL (ref 140–440)
RBC: 3.07 10*6/uL — ABNORMAL LOW (ref 3.63–4.92)
RDW: 14.8 % (ref 12.3–17.7)
WBC: 8.1 10*3/uL (ref 3.8–11.8)

## 2022-10-04 LAB — CORTISOL, PLASMA OR SERUM: CORTISOL: 17.2 ug/dL (ref 6.7–22.6)

## 2022-10-04 LAB — MAGNESIUM: MAGNESIUM: 1.6 mg/dL — ABNORMAL LOW (ref 1.9–2.7)

## 2022-10-04 LAB — THYROID STIMULATING HORMONE WITH FREE T4 REFLEX: TSH: 1.046 u[IU]/mL (ref 0.450–5.330)

## 2022-10-04 MED ORDER — POTASSIUM CHLORIDE 20 MEQ/100ML IN STERILE WATER INTRAVENOUS PIGGYBACK
20.0000 meq | INJECTION | Freq: Two times a day (BID) | INTRAVENOUS | Status: DC | PRN
Start: 2022-10-04 — End: 2022-10-04

## 2022-10-04 MED ORDER — MEPERIDINE (PF) 25 MG/ML INJECTION SOLUTION
12.5000 mg | Freq: Once | INTRAMUSCULAR | Status: DC | PRN
Start: 2022-10-04 — End: 2022-10-05

## 2022-10-04 MED ORDER — DEXTROSE 5% IN WATER (D5W) FLUSH BAG - 250 ML
INTRAVENOUS | Status: DC | PRN
Start: 2022-10-04 — End: 2022-10-05

## 2022-10-04 MED ORDER — POTASSIUM CHLORIDE 20 MEQ/100ML IN STERILE WATER INTRAVENOUS PIGGYBACK
INJECTION | INTRAVENOUS | Status: AC
Start: 2022-10-04 — End: 2022-10-04
  Filled 2022-10-04: qty 100

## 2022-10-04 MED ORDER — OLANZAPINE 5 MG TABLET
ORAL_TABLET | ORAL | Status: AC
Start: 2022-10-04 — End: 2022-10-04
  Filled 2022-10-04: qty 1

## 2022-10-04 MED ORDER — SODIUM CHLORIDE 0.9 % INTRAVENOUS SOLUTION
600.0000 mg/m2 | Freq: Once | INTRAVENOUS | Status: AC
Start: 2022-10-04 — End: 2022-10-04
  Administered 2022-10-04: 0 mg via INTRAVENOUS
  Administered 2022-10-04: 1420 mg via INTRAVENOUS
  Filled 2022-10-04: qty 50

## 2022-10-04 MED ORDER — MAGNESIUM SULFATE 1 GRAM/100 ML IN DEXTROSE 5 % INTRAVENOUS PIGGYBACK
1.0000 g | INJECTION | INTRAVENOUS | Status: AC
Start: 2022-10-04 — End: 2022-10-04
  Administered 2022-10-04: 1 g via INTRAVENOUS
  Administered 2022-10-04: 0 g via INTRAVENOUS
  Administered 2022-10-04: 1 g via INTRAVENOUS
  Administered 2022-10-04: 0 g via INTRAVENOUS

## 2022-10-04 MED ORDER — HYDROCORTISONE SOD SUCCINATE 100 MG/2 ML VIAL WRAPPER
100.0000 mg | Freq: Once | INTRAMUSCULAR | Status: DC | PRN
Start: 2022-10-04 — End: 2022-10-05

## 2022-10-04 MED ORDER — SODIUM CHLORIDE 0.9% FLUSH BAG - 250 ML
INTRAVENOUS | Status: DC | PRN
Start: 2022-10-04 — End: 2022-10-05

## 2022-10-04 MED ORDER — MAGNESIUM SULFATE 1 GRAM/100 ML IN DEXTROSE 5 % INTRAVENOUS PIGGYBACK
INJECTION | INTRAVENOUS | Status: AC
Start: 2022-10-04 — End: 2022-10-04
  Filled 2022-10-04: qty 200

## 2022-10-04 MED ORDER — DOXORUBICIN 2 MG/ML INTRAVENOUS SOLUTION
60.0000 mg/m2 | Freq: Once | INTRAVENOUS | Status: AC
Start: 2022-10-04 — End: 2022-10-04
  Administered 2022-10-04: 141.6 mg via INTRAVENOUS
  Filled 2022-10-04: qty 70.8

## 2022-10-04 MED ORDER — FAMOTIDINE (PF) 20 MG/2 ML INTRAVENOUS SOLUTION
20.0000 mg | Freq: Once | INTRAVENOUS | Status: DC | PRN
Start: 2022-10-04 — End: 2022-10-05

## 2022-10-04 MED ORDER — PROCHLORPERAZINE MALEATE 10 MG TABLET
10.0000 mg | ORAL_TABLET | Freq: Four times a day (QID) | ORAL | Status: DC | PRN
Start: 2022-10-04 — End: 2022-10-05

## 2022-10-04 MED ORDER — SODIUM CHLORIDE 0.9 % INTRAVENOUS SOLUTION
150.0000 mg | Freq: Once | INTRAVENOUS | Status: AC
Start: 2022-10-04 — End: 2022-10-04
  Administered 2022-10-04: 150 mg via INTRAVENOUS
  Administered 2022-10-04: 0 mg via INTRAVENOUS
  Filled 2022-10-04: qty 5

## 2022-10-04 MED ORDER — LORAZEPAM 2 MG/ML INJECTION WRAPPER
0.5000 mg | Freq: Four times a day (QID) | INTRAMUSCULAR | Status: DC | PRN
Start: 2022-10-04 — End: 2022-10-05

## 2022-10-04 MED ORDER — ALBUTEROL SULFATE HFA 90 MCG/ACTUATION AEROSOL INHALER - RN
2.0000 | Freq: Once | RESPIRATORY_TRACT | Status: DC | PRN
Start: 2022-10-04 — End: 2022-10-05

## 2022-10-04 MED ORDER — EPINEPHRINE 1 MG/ML (1 ML) INJECTION SOLUTION
0.3000 mg | Freq: Once | INTRAMUSCULAR | Status: DC | PRN
Start: 2022-10-04 — End: 2022-10-05

## 2022-10-04 MED ORDER — PALONOSETRON 0.25 MG/5 ML INTRAVENOUS SOLUTION
INTRAVENOUS | Status: AC
Start: 2022-10-04 — End: 2022-10-04
  Filled 2022-10-04: qty 5

## 2022-10-04 MED ORDER — POTASSIUM CHLORIDE 20 MEQ/100ML IN STERILE WATER INTRAVENOUS PIGGYBACK
20.0000 meq | INJECTION | Freq: Once | INTRAVENOUS | Status: AC
Start: 2022-10-04 — End: 2022-10-04
  Administered 2022-10-04: 0 meq via INTRAVENOUS
  Administered 2022-10-04: 20 meq via INTRAVENOUS

## 2022-10-04 MED ORDER — PALONOSETRON 0.25 MG/5 ML INTRAVENOUS SOLUTION
0.2500 mg | Freq: Once | INTRAVENOUS | Status: AC
Start: 2022-10-04 — End: 2022-10-04
  Administered 2022-10-04: 0.25 mg via INTRAVENOUS

## 2022-10-04 MED ORDER — ALBUTEROL SULFATE 2.5 MG/3 ML (0.083 %) SOLUTION FOR NEBULIZATION
2.5000 mg | INHALATION_SOLUTION | Freq: Once | RESPIRATORY_TRACT | Status: DC | PRN
Start: 2022-10-04 — End: 2022-10-05

## 2022-10-04 MED ORDER — LORAZEPAM 0.5 MG TABLET
0.5000 mg | ORAL_TABLET | Freq: Four times a day (QID) | ORAL | Status: DC | PRN
Start: 2022-10-04 — End: 2022-10-05

## 2022-10-04 MED ORDER — OLANZAPINE 5 MG TABLET
5.0000 mg | ORAL_TABLET | Freq: Once | ORAL | Status: DC
Start: 2022-10-04 — End: 2022-10-05
  Administered 2022-10-04: 0 mg via ORAL

## 2022-10-04 MED ORDER — DIPHENHYDRAMINE 50 MG/ML INJECTION SOLUTION
50.0000 mg | Freq: Once | INTRAMUSCULAR | Status: DC | PRN
Start: 2022-10-04 — End: 2022-10-05

## 2022-10-04 MED ORDER — SODIUM CHLORIDE 0.9 % INTRAVENOUS SOLUTION
200.0000 mg | Freq: Once | INTRAVENOUS | Status: AC
Start: 2022-10-04 — End: 2022-10-04
  Administered 2022-10-04: 0 mg via INTRAVENOUS
  Administered 2022-10-04: 200 mg via INTRAVENOUS
  Filled 2022-10-04: qty 8

## 2022-10-04 MED ORDER — POTASSIUM CHLORIDE 20 MEQ/100ML IN STERILE WATER INTRAVENOUS PIGGYBACK
20.0000 meq | INJECTION | Freq: Once | INTRAVENOUS | Status: AC
Start: 2022-10-04 — End: 2022-10-04
  Administered 2022-10-04: 20 meq via INTRAVENOUS
  Administered 2022-10-04: 0 meq via INTRAVENOUS

## 2022-10-04 MED ORDER — PROCHLORPERAZINE EDISYLATE 10 MG/2 ML (5 MG/ML) INJECTION SOLUTION
10.0000 mg | Freq: Four times a day (QID) | INTRAMUSCULAR | Status: DC | PRN
Start: 2022-10-04 — End: 2022-10-05

## 2022-10-04 MED ORDER — DIPHENHYDRAMINE 50 MG/ML INJECTION SOLUTION
25.0000 mg | Freq: Once | INTRAMUSCULAR | Status: DC | PRN
Start: 2022-10-04 — End: 2022-10-05

## 2022-10-04 NOTE — Nursing Note (Signed)
10/04/22 1441 Referral received from Dr Damita Lack. Pt received taxol/ carbo/ keytruda followed by [facility]. Last A/C dose today and then continues with Keytruda. Sent message to surgical nurse; pt needs to be seen asap with imaging. She is wanting to see plastics as well. Scheduled with Dr Fredric Mare on 10/15/22 at 1030; with soonest appt. Called pt and left my contact number to return phone call.  Sent message to plastic surgery referral coordinator. P.Shamonique Battiste, RN

## 2022-10-04 NOTE — Nurses Notes (Addendum)
1000-Patient to room ambulatory.  Patient had recent fall and made fall risk.  Patient is oriented x4 and lungs sound clear.  Patient reports heart palpatations and dizziness when standing.  Patient reports all have been discussed with provider this morning.  Patient will have potassium and magnesium replacement today.  Other symptoms include chronic neuropathy and decreased appetite.  Vitals were stable this morning.Salli Quarry, RN    Patient Assessment/Symptom Management Patient Has MD Appointment Today   Key: (+) Symptom present           (-)  Symptom not present If Symptom is Positive(+) a Nursing Note is required   Edema -   Uncontrolled Nausea -   Vomiting -   Inability to eat/drink -   Mouth Sores -   Diarrhea -   Constipation (? Last BM) -   Fatigue that interferes with ADL's +   Numbness/Tingling -change +   Heart palpitations + Provider aware   Fever/Signs & Symptoms of infection -   Nurse Initials KB       1018-Port accessed and flushed per protocol.  Blood return obtained.Salli Quarry, RN  [phone] given at this time.Salli Quarry, RN  1029-First potassium chloride infusion started.Salli Quarry, RN  1040-2 Gram magnesium infusion started.Salli Quarry, RN  [phone] Gram magnesium infusion complete.  Patient tolerated well.Salli Quarry, RN  1229-First potassium chloride infusion complete.  Patient had no issues.Salli Quarry, RN  1255-Emend infusion started.Salli Quarry, RN  1315-Emend infusion complete.  Patient tolerated well.Salli Quarry, RN  1330-KEYTRUDA infusion started.Salli Quarry, RN  1350-KEYTRUDA infusion complete.  Patient tolerated well.Salli Quarry, RN  1408-CYTOXAN infusion started.Salli Quarry, RN  1411-Second potassium chloride infusion started.Salli Quarry, RN  1438-CYTOXAN infusion complete.  Patient had no issues.Salli Quarry, RN  1611-Second potassium infusion complete.Salli Quarry, RN  1617-ADRIAMYCIN push given.Salli Quarry, RN  1640-Port flushed per protocol and deaccessed.   Blood return obtained prior.Salli Quarry, RN  1645-Patient checking out at this time.  Vitals stable.Salli Quarry, RN

## 2022-10-04 NOTE — Nursing Note (Signed)
10/04/22 1545 Christina called back; reviewed appointment with Dr Fredric Mare on 10/15/22 at 1030. She agreed to the appointment. Pt needing to get in to see surgeon; has completed neoadjuvant treatment today (10/04/22) provided my contact information. She thanked me for the phone call. P.Alonia Dibuono, RN

## 2022-10-04 NOTE — Progress Notes (Unsigned)
Department of Hematology/Oncology  History and Physical    Name: Rachel Mendoza  ZOX:[id number]  Date of Birth: 08-26-1975  Encounter Date: 10/04/2022    REFERRING PROVIDER:  No referring provider defined for this encounter.    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    HISTORY OF PRESENT ILLNESS:  Rachel Mendoza is a 47 y.o. female who presents today for initial medical oncology consultation regarding triple negative breast cancer.  The patient discovered a lump which was evaluated with mammogram, ultrasound, and biopsy.  The primary lesion measures in the range of 2.2-2.9 cm, and there were some nearby lymph nodes that were described as prominent although not obviously involved.  The narrow measurement for the lymph nodes was in the range of 1.1 cm.    The biopsy showed invasive ductal malignancy that was triple negative.  BRCA testing is currently pending.  Based on the above information, she was referred for consideration of neoadjuvant chemotherapy.    05/10/2022: The patient is here for follow up of triple negative breast cancer.  We still do not have the BRCA result.  We have also been trying to obtain a pretreatment PET-CT, but have not been able to obtain approval for it yet.    05/31/2022: The patient is here for follow up of triple negative breast cancer.  She states that she has noticed a dramatic improvement in the size of the lesion.  She states that she struggles to palpate it now.    06/21/2022: The patient is here for follow up of triple negative breast cancer.  She states that she is having fatigue and various other issues related to treatment.    09/13/2022: The patient is here for follow up of triple negative breast cancer. She states that she feels bad for approximately 10 days after chemotherapy treatments.     10/04/2022: The patient is here for follow up of triple negative breast cancer.  She states that she has a problem  with dizziness when she stands up too quickly.  Otherwise, she is doing about the same.    ROS:   Review of Systems   Constitutional:  Negative for appetite change, chills and fatigue.   HENT:   Negative for sore throat and trouble swallowing.    Eyes:  Negative for eye problems.   Respiratory:  Negative for cough and shortness of breath.    Cardiovascular:  Negative for chest pain and leg swelling.   Gastrointestinal:  Negative for abdominal pain.   Genitourinary:  Negative for dysuria and hematuria.    Musculoskeletal:  Negative for arthralgias and gait problem.   Skin:  Negative for rash.   Neurological:  Negative for gait problem.   Hematological:  Negative for adenopathy.   Psychiatric/Behavioral:  Negative for depression.         HISTORY:  Past Medical History:   Diagnosis Date    Allergic rhinitis     Anal fissure     Asthma     Bradycardia     Depression     Esophageal reflux     Hx of breast cancer     Hypersomnia     Hypertension     Hypothyroidism     Insomnia, unspecified     Kidney stones 06/06/2021    Nerve damage     Obesity, unspecified     Sleep apnea     Tachycardia, unspecified  Past Surgical History:   Procedure Laterality Date    HX BACK SURGERY      HX BREAST BIOPSY Right     HX HAND SURGERY Left     left thumb    HX HYSTERECTOMY      HX LITHOTRIPSY      HX TONSILLECTOMY      HX TUBAL LIGATION Bilateral     PORTACATH PLACEMENT           Social History     Socioeconomic History    Marital status: Married     Spouse name: Not on file    Number of children: Not on file    Years of education: Not on file    Highest education level: Not on file   Occupational History    Not on file   Tobacco Use    Smoking status: Every Day     Current packs/day: 0.50     Average packs/day: 0.5 packs/day for 36.5 years (18.2 ttl pk-yrs)     Types: Cigarettes     Start date: 04/08/1986     Passive exposure: Never    Smokeless tobacco: Never   Vaping Use    Vaping status: Never Used   Substance and Sexual  Activity    Alcohol use: Not Currently     Comment: occasionally    Drug use: Yes     Types: Marijuana     Comment: CBD Gummies    Sexual activity: Not Currently   Other Topics Concern    Not on file   Social History Narrative    Not on file     Social Determinants of Health     Financial Resource Strain: Not on file   Transportation Needs: Not on file   Social Connections: Not on file   Intimate Partner Violence: Not on file   Housing Stability: Not on file     Family Medical History:       Problem Relation (Age of Onset)    Asthma Brother, Maternal Grandmother    Breast Cancer Paternal Grandmother    Diabetes type II Father, Paternal Grandmother    Elevated Lipids Father    Heart Disease Father    Hypertension (High Blood Pressure) Mother, Father    No Known Problems Sister, Maternal Aunt, Maternal Uncle, Paternal Aunt, Paternal Uncle, Maternal Grandfather, Paternal Grandfather, Daughter, Son, Other            Current Outpatient Medications   Medication Sig    albuterol sulfate (PROVENTIL OR VENTOLIN OR PROAIR) 90 mcg/actuation Inhalation oral inhaler Take 1-2 Puffs by inhalation Every 6 hours as needed    aprepitant (EMEND) 80 mg Oral Capsule Take 1 Capsule (80 mg total) by mouth Once a day Indications: prevent nausea and vomiting from cancer chemotherapy    beclomethasone dipropionate (QVAR REDIHALER) 80 mcg/actuation Inhalation oral inhaler Take 1 Puff by inhalation Twice daily    dicyclomine (BENTYL) 20 mg Oral Tablet Take 1 Tablet (20 mg total) by mouth Four times a day    dilTIAZem (CARDIZEM CD) 120 mg Oral Capsule, Sust. Release 24 hr Take 1 Capsule (120 mg total) by mouth Once a day    gabapentin (NEURONTIN) 300 mg Oral Capsule Take 1 Capsule (300 mg total) by mouth Four times a day    hydroCHLOROthiazide (HYDRODIURIL) 25 mg Oral Tablet Take 1 Tablet (25 mg total) by mouth Once a day    levothyroxine (SYNTHROID) 175 mcg Oral Tablet Take 1 Tablet (175 mcg  total) by mouth Every morning    LIDOCAINE VISCOUS 2  % Mucous Membrane Solution Once per day as needed    lidocaine-prilocaine (EMLA) 2.5-2.5 % Cream APPLY TOPICALLY AS A ONE-TIME DOSE    loratadine (CLARITIN) 10 mg Oral Tablet Take 1 Tablet (10 mg total) by mouth Once a day    losartan (COZAAR) 50 mg Oral Tablet Take 1 Tablet (50 mg total) by mouth Once a day    Magic Mouthwash Swish and spit 10 mL Every 2 hours as needed for Sore throat Contains lidocaine viscous 2%, diphenhydramine 12.5 mg/5 ml, Maalox. Mix 1:1:1    magnesium Oxide 420 mg Oral Tablet Take 486 mg by mouth Twice daily    miconazole nitrate (SECURA) 2 % Cream Apply topically Twice daily    montelukast (SINGULAIR) 10 mg Oral Tablet Take 1 Tablet (10 mg total) by mouth Once a day    nystatin (MYCOSTATIN) 100,000 unit/mL Oral Suspension Take 5 mL by mouth Four times a day    omeprazole (PRILOSEC) 20 mg Oral Capsule, Delayed Release(E.C.) Take 1 Capsule (20 mg total) by mouth Once a day    ondansetron (ZOFRAN) 8 mg Oral Tablet Take 1 Tablet (8 mg total) by mouth Every 8 hours as needed for Nausea/Vomiting    potassium chloride (K-DUR) 20 mEq Oral Tab Sust.Rel. Particle/Crystal Take 1 Tablet (20 mEq total) by mouth Once a day (Patient taking differently: Take 1 Tablet (20 mEq total) by mouth Once a day Has not been taking)    prochlorperazine (COMPAZINE) 10 mg Oral Tablet Take 1 Tablet (10 mg total) by mouth Four times a day as needed for Nausea/Vomiting    promethazine (PHENERGAN) 25 mg Oral Tablet TAKE 1 TABLET BY MOUTH EVERY 6 HOURS AS NEEDED FOR NAUSEA AND VOMITING    sertraline (ZOLOFT) 100 mg Oral Tablet Take 1 Tablet (100 mg total) by mouth Once a day     Allergies   Allergen Reactions    Dexamethasone (Pf) Swelling    Tamsulosin Nausea/ Vomiting       PHYSICAL EXAM:  Most Recent Vitals    Flowsheet Row Telemedicine from 04/24/2022 in Hematology/Oncology,   [facility]   Temperature 36.6 C (97.8 F) filed at... 04/24/2022 1338   Heart Rate 104 filed at... 04/24/2022 1338    Respiratory Rate --   BP (Non-Invasive) 147/99 filed at... 04/24/2022 1338   SpO2 96 % filed at... 04/24/2022 1338   Height 1.651 m (5\' 5" ) filed at... 04/24/2022 1338   Weight 121 kg (266 lb 6.4 oz) filed at... 04/24/2022 1338   BMI (Calculated) 44.42 filed at... 04/24/2022 1338   BSA (Calculated) 2.35 filed at... 04/24/2022 1338      ECOG Status: (0) Fully active, able to carry on all predisease performance without restriction   Physical Exam    DIAGNOSTIC DATA:  No results found for this or any previous visit (from the past [address] hour(s)).    LABS:   CBC  Diff   Lab Results   Component Value Date/Time    WBC 8.1 10/04/2022 08:59 AM    HGB 11.1 10/04/2022 08:59 AM    HCT 32.3 10/04/2022 08:59 AM    PLTCNT 264 10/04/2022 08:59 AM    RBC 3.07 (L) 10/04/2022 08:59 AM    MCV 105.2 (H) 10/04/2022 08:59 AM    MCHC 34.3 10/04/2022 08:59 AM    MCH 36.1 (H) 10/04/2022 08:59 AM    RDW 14.8 10/04/2022 08:59 AM    MPV  7.6 (L) 10/04/2022 08:59 AM    Lab Results   Component Value Date/Time    PMNS 68 10/04/2022 08:59 AM    LYMPHOCYTES 24 10/04/2022 08:59 AM    EOSINOPHIL 1 10/04/2022 08:59 AM    MONOCYTES 6 10/04/2022 08:59 AM    BASOPHILS 1 10/04/2022 08:59 AM    BASOPHILS 0.10 10/04/2022 08:59 AM    PMNABS 5.50 10/04/2022 08:59 AM    LYMPHSABS 2.00 10/04/2022 08:59 AM    EOSABS 0.00 10/04/2022 08:59 AM    MONOSABS 0.50 10/04/2022 08:59 AM            ASSESSMENT:    ICD-10-CM    1. Malignant neoplasm of upper-outer quadrant of right breast in female, estrogen receptor negative (CMS HCC)  C50.411 Refer to [facility]    Z17.1              PLAN:   1. All relevant medical records were reviewed including available pertinent provider notes, procedure notes, imaging, laboratory, and pathology.   2. All pertinent labs and/or imaging were reviewed with the patient.   3. Triple negative breast cancer:  We previously reviewed the staging information with the patient, and reviewed the NCCN guidelines.  She is either T2 N0  M0 or T2 N1 M0.  Either one of those would be categorized as stage II.  According to NCCN guidelines, this falls into the high-risk triple negative category. BRCA mutation status is negative. Continue current treatment.  She was towards the end of her treatment with cytotoxic chemotherapy.  She was starting to have some thoughts about plans for surgery, and we discussed several options.  I have recommended that she see Dr. Lovell Sheehan for assistance in trying to plan for coordinating reconstruction efforts.    NIMO WHILLOCK was given the chance to ask questions, and these were answered to their satisfaction. The patient is welcome to call with any questions or concerns in the meantime.     On the day of the encounter, a total of 35 minutes was spent on this patient encounter including review of historical information, examination, documentation and post-visit activities.   Return in about 3 weeks (around 10/25/2022).     Lupita Dawn, MD  10/04/2022, 09:53  The patient's insurance company bears full legal and financial responsibility resulting from any deviations that they cause to my recommended treatment plan.     CC:  [name], FNP-BC  [address]  [place] [address]    No referring provider defined for this encounter.    This note was partially generated using M[employer] Fluency Direct system, and there may be some incorrect words, spellings, and punctuation that were not noted in checking the note before saving.

## 2022-10-05 ENCOUNTER — Encounter (HOSPITAL_COMMUNITY): Payer: Self-pay | Admitting: HEMATOLOGY-ONCOLOGY

## 2022-10-07 ENCOUNTER — Ambulatory Visit: Payer: Commercial Managed Care - PPO | Attending: GENERAL SURGERY

## 2022-10-07 ENCOUNTER — Other Ambulatory Visit (HOSPITAL_BASED_OUTPATIENT_CLINIC_OR_DEPARTMENT_OTHER): Payer: Self-pay | Admitting: Surgery

## 2022-10-07 DIAGNOSIS — Z710 Person encountering health services to consult on behalf of another person: Secondary | ICD-10-CM

## 2022-10-07 DIAGNOSIS — C50911 Malignant neoplasm of unspecified site of right female breast: Secondary | ICD-10-CM

## 2022-10-08 ENCOUNTER — Encounter (HOSPITAL_BASED_OUTPATIENT_CLINIC_OR_DEPARTMENT_OTHER): Payer: Self-pay

## 2022-10-08 ENCOUNTER — Telehealth (HOSPITAL_BASED_OUTPATIENT_CLINIC_OR_DEPARTMENT_OTHER): Payer: Self-pay | Admitting: Surgery

## 2022-10-08 NOTE — Telephone Encounter (Signed)
1530 incoming call from pt returning my call. Confirmed breast imaging appointments on 07-09 prior to her visit with Dr Fredric Mare. Discussed family house referral for patient but she states they wish to stay in a hotel, pt was provided the family house number so she could receive the discounted hotel information from them. Pt is aware she will also be contacted to schedule an appointment with plastic surgery. Pt states she has missed our referral coordinator's calls because she hasn't been feeling well after her last chemo treatment and has been sleeping a lot. Pt has our office number to call if she has any questions or concerns. Joan Flores, RN

## 2022-10-09 ENCOUNTER — Other Ambulatory Visit (HOSPITAL_COMMUNITY): Payer: Self-pay | Admitting: NURSE PRACTITIONER

## 2022-10-09 DIAGNOSIS — R112 Nausea with vomiting, unspecified: Secondary | ICD-10-CM

## 2022-10-14 ENCOUNTER — Telehealth (HOSPITAL_BASED_OUTPATIENT_CLINIC_OR_DEPARTMENT_OTHER): Payer: Self-pay | Admitting: Surgery

## 2022-10-14 NOTE — Telephone Encounter (Signed)
1320 returned patient's incoming call into the office. She is scheduled for breast imaging and a clinic visit with Dr Fredric Mare tomorrow. She was unable to be scheduled with plastic surgery until 07-15. Referral coordinator tried to coordinate appointments but there was no plastic surgery availability to coordinate on 07-09. Pt then asked to coordinate all appointments on 07-15, but Dr Fredric Mare will be out of office next week. It was advised to patient since her chemotherapy has actually ended on 06-26 she should be seen as soon as possible to initiate the surgical planning process. Karolee Ohs reached out to plastic surgery RN's and they are unable to coordinate plastics visit on 07-09 or 07-10. Joan Flores, RN

## 2022-10-15 ENCOUNTER — Other Ambulatory Visit: Payer: Self-pay

## 2022-10-15 ENCOUNTER — Encounter (HOSPITAL_BASED_OUTPATIENT_CLINIC_OR_DEPARTMENT_OTHER): Payer: Self-pay | Admitting: Surgery

## 2022-10-15 ENCOUNTER — Ambulatory Visit (HOSPITAL_BASED_OUTPATIENT_CLINIC_OR_DEPARTMENT_OTHER)
Admission: RE | Admit: 2022-10-15 | Discharge: 2022-10-15 | Disposition: A | Discharge status: Home / Self Care | Payer: Commercial Managed Care - PPO | Source: Ambulatory Visit

## 2022-10-15 ENCOUNTER — Ambulatory Visit: Payer: Commercial Managed Care - PPO | Attending: Surgery | Admitting: Surgery

## 2022-10-15 ENCOUNTER — Encounter (HOSPITAL_COMMUNITY): Payer: Self-pay

## 2022-10-15 DIAGNOSIS — Z171 Estrogen receptor negative status [ER-]: Secondary | ICD-10-CM | POA: Insufficient documentation

## 2022-10-15 DIAGNOSIS — C50911 Malignant neoplasm of unspecified site of right female breast: Secondary | ICD-10-CM

## 2022-10-15 DIAGNOSIS — C50811 Malignant neoplasm of overlapping sites of right female breast: Secondary | ICD-10-CM | POA: Insufficient documentation

## 2022-10-15 DIAGNOSIS — C50411 Malignant neoplasm of upper-outer quadrant of right female breast: Secondary | ICD-10-CM

## 2022-10-15 NOTE — Progress Notes (Signed)
BREAST [facility]IN[facility], [facility]  [address]  [place] [address]    Chief Complaint: newly diagnosed     Rachel Mendoza is a 47 y.o. White female who presents as a new patient to clinic today for evaluation of newly diagnosed breast cancer. Per review of her outside records and px report,  patient initially palpated mass in her right breast in the beginning of December 2023, she subsequently underwent diagnostic mammogram on 12/6 that showed 2 x 2.2 x 2.1 cm oval mass in the right breast with no microcalcifications, skin thickening, or retraction. 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 She initially met with a surgeon and planned to undergo breast conserving therapy with radiation, but started neoadjuvant chemotherapy due to the Ki67. CT imaging on 06/03/22 showed no evidence of metastatic disease. She underwent 4 cycles of paclitaxel, carboplatin, and keytruda and then 9 cycles of adriamycin, cytoxan and keytruda, finishing her last cycle on 6/28. She underwent genetic testing that showed no clinically significant variants. Her ultrasound today showed a 1.1 cm residual mass with normal axillary lymph nodes. She reports today that she is interested in a lumpectomy with radiation. She is scheduled to see Dr. Rich Reining on 7/15 to discuss reconstruction options. She admits/ denies tenderness or pain.  Denies the presence of nipple discharge, change in appearance of the breast, hx of trauma to the breast, axillary adenopathy or a previous Hx of cyst aspirations or breast biopsies.     She was referred by: Lupita Dawn, MD    Her Imaging has included:  12/6 dx bilateral mammogram  12/13 ultrasound guided breast biopsy right  2/26 CT chest/abdomen/pelvis w/ IV contrast  7/9 dx mammogram right   7/9 US breast right     Breast cancer risk factors include menarche before age 49.    HIGH RISK breast factors include:   N/a    GYN  HISTORY:   Age of menarche was 26.   Patient is G3P3.   Age of menopause was N/a.    Age of first live birth was 49.   Patient denies hormonal therapy.    PMH:   Past Medical History:   Diagnosis Date    Allergic rhinitis     Anal fissure     Asthma     Bradycardia     Depression     Esophageal reflux     Hx of breast cancer     Hypersomnia     Hypertension     Hypothyroidism     Insomnia, unspecified     Kidney stones 06/06/2021    Nerve damage     Obesity, unspecified     Sleep apnea     Tachycardia, unspecified          Past Surgical History:   Procedure Laterality Date    HX BACK SURGERY      HX BREAST BIOPSY Right     HX HAND SURGERY Left     left thumb    HX HYSTERECTOMY      HX LITHOTRIPSY      HX TONSILLECTOMY      HX TUBAL LIGATION Bilateral     PORTACATH PLACEMENT           Family Medical History:       Problem Relation (Age of Onset)    Asthma Brother, Maternal Grandmother    Breast Cancer Paternal Grandmother    Diabetes type II  Father, Paternal Grandmother    Elevated Lipids Father    Heart Disease Father    Hypertension (High Blood Pressure) Mother, Father    No Known Problems Sister, Maternal Aunt, Maternal Uncle, Paternal Aunt, Paternal Uncle, Maternal Grandfather, Paternal Grandfather, Daughter, Son, Other            Current Outpatient Medications   Medication Sig Dispense Refill    albuterol sulfate (PROVENTIL OR VENTOLIN OR PROAIR) 90 mcg/actuation Inhalation oral inhaler Take 1-2 Puffs by inhalation Every 6 hours as needed 1 Each 2    aprepitant (EMEND) 80 mg Oral Capsule Take 1 Capsule (80 mg total) by mouth Once a day Indications: prevent nausea and vomiting from cancer chemotherapy 4 Capsule 2    beclomethasone dipropionate (QVAR REDIHALER) 80 mcg/actuation Inhalation oral inhaler Take 1 Puff by inhalation Twice daily 10.6 g 4    dicyclomine (BENTYL) 20 mg Oral Tablet Take 1 Tablet (20 mg total) by mouth Four times a day 120 Tablet 1    dilTIAZem (CARDIZEM CD) 120 mg Oral Capsule, Sust.  Release 24 hr Take 1 Capsule (120 mg total) by mouth Once a day 90 Capsule 1    gabapentin (NEURONTIN) 300 mg Oral Capsule Take 1 Capsule (300 mg total) by mouth Four times a day      hydroCHLOROthiazide (HYDRODIURIL) 25 mg Oral Tablet Take 1 Tablet (25 mg total) by mouth Once a day 90 Tablet 1    levothyroxine (SYNTHROID) 175 mcg Oral Tablet Take 1 Tablet (175 mcg total) by mouth Every morning 90 Tablet 1    LIDOCAINE VISCOUS 2 % Mucous Membrane Solution Once per day as needed      lidocaine-prilocaine (EMLA) 2.5-2.5 % Cream APPLY TOPICALLY AS A ONE-TIME DOSE      loratadine (CLARITIN) 10 mg Oral Tablet Take 1 Tablet (10 mg total) by mouth Once a day 90 Tablet 1    losartan (COZAAR) 50 mg Oral Tablet Take 1 Tablet (50 mg total) by mouth Once a day 90 Tablet 1    Magic Mouthwash Swish and spit 10 mL Every 2 hours as needed for Sore throat Contains lidocaine viscous 2%, diphenhydramine 12.5 mg/5 ml, Maalox. Mix 1:1:1 120 mL 0    magnesium Oxide 420 mg Oral Tablet Take 486 mg by mouth Twice daily      miconazole nitrate (SECURA) 2 % Cream Apply topically Twice daily 30 g 2    montelukast (SINGULAIR) 10 mg Oral Tablet Take 1 Tablet (10 mg total) by mouth Once a day 90 Tablet 1    nystatin (MYCOSTATIN) 100,000 unit/mL Oral Suspension Take 5 mL by mouth Four times a day 200 mL 1    omeprazole (PRILOSEC) 20 mg Oral Capsule, Delayed Release(E.C.) Take 1 Capsule (20 mg total) by mouth Once a day 90 Capsule 1    ondansetron (ZOFRAN) 8 mg Oral Tablet Take 1 Tablet (8 mg total) by mouth Every 8 hours as needed for Nausea/Vomiting 30 Tablet 2    potassium chloride (K-DUR) 20 mEq Oral Tab Sust.Rel. Particle/Crystal Take 1 Tablet (20 mEq total) by mouth Once a day (Patient taking differently: Take 1 Tablet (20 mEq total) by mouth Once a day Has not been taking) 30 Tablet 5    prochlorperazine (COMPAZINE) 10 mg Oral Tablet Take 1 Tablet (10 mg total) by mouth Four times a day as needed for Nausea/Vomiting 60 Tablet 2     promethazine (PHENERGAN) 25 mg Oral Tablet TAKE 1 TABLET BY MOUTH EVERY 6 HOURS AS  NEEDED FOR NAUSEA AND VOMITING 30 Tablet 0    sertraline (ZOLOFT) 100 mg Oral Tablet Take 1 Tablet (100 mg total) by mouth Once a day 90 Tablet 1     No current facility-administered medications for this visit.     Allergies   Allergen Reactions    Dexamethasone (Pf) Swelling    Tamsulosin Nausea/ Vomiting     Social History     Socioeconomic History    Marital status: Married     Spouse name: Not on file    Number of children: Not on file    Years of education: Not on file    Highest education level: Not on file   Occupational History    Not on file   Tobacco Use    Smoking status: Every Day     Current packs/day: 0.50     Average packs/day: 0.5 packs/day for 36.5 years (18.3 ttl pk-yrs)     Types: Cigarettes     Start date: 04/08/1986     Passive exposure: Never    Smokeless tobacco: Never   Vaping Use    Vaping status: Never Used   Substance and Sexual Activity    Alcohol use: Not Currently     Comment: occasionally    Drug use: Yes     Types: Marijuana     Comment: CBD Gummies    Sexual activity: Not Currently   Other Topics Concern    Not on file   Social History Narrative    Not on file     Social Determinants of Health     Financial Resource Strain: Not on file   Transportation Needs: Not on file   Social Connections: Not on file   Intimate Partner Violence: Not on file   Housing Stability: Not on file       REVIEW OF SYSTEMS:  Constitutional: endorses night sweats and weight loss; denies any pertinent negatives   HEENT: endorses dizziness, blurred/double vision, bleeding gums, and difficulty swallowing; denies any pertinent negatives  Cardiovascular: endorses palpitations, irregular heart beat, and hypetension ; denies any pertinent negatives  Respiratory: endorses cough, asthma, and sleep apnea; denies any pertinent negatives  Gastrointestinal (GI): endorses abdominal pain, nausea, vomiting, diarrhea, and constipation; denies  any pertinent negatives  Genitourinary (GU): endorses kidney stones; denies any pertinent negatives  Musculoskeletal: endorses back pain and weakness in arms/legs; denies any pertinent negatives  Integumentary: endorses no pertinent positives; denies any pertinent negatives   Hematologic: endorses no pertinent positives; denies any pertinent negatives  Endocrine: endorsesthyroid disease and polydipsia; denies any pertinent negatives  Neurological: endorses no pertinent positives; denies any pertinent negatives  Psychiatric: endorses anxiety, depression, and difficulty sleeping; denies any pertinent negatives    ROS:  General: Very pleasant; Denies fever or chills  Head: Denies recent trauma or persistent light headedness   EENT: Denies blurry vision, tinnitus, nasal congestion, or trouble with swallowing  Cardiovascular: Denies Chest pain, palpitations, hx of heart attack or heart surgery  Respiratory: Denies SOB, cough, asthma, COPD/emphysema and sleep apnea  Gastrointestinal: Denies abdominal pain, Nausea/ vomiting, constipation/ diarrhea  GU: Denies dysuria or hematuria  Musculoskeletal: Denies weakness, arthritis or new onset back pain/ bone pain   Neurological: Denies HA, dizziness, hx of stroke or seizures  Hematologic: Denies hx of bleeding disorders or blood clots  Integumentary: denies a personal hx of skin cancer or scleroderma   Endocrine: denies hx of diabetes or thyroid disease  Psychiatric: denies anxiety, depression or nervous d/o  Objective:  Vitals:    10/15/22 1030   BP: (!) 142/76   Pulse: (!) 116   Resp: 18   Temp: 36.5 C (97.7 F)   TempSrc: Temporal   SpO2: 95%   Weight: 116 kg (255 lb)   Height: 1.651 m (5\' 5" )   BMI: 42.52        Physical Exam:   Constitutional: Very pleasant, well nourished female in no apparent distress  HNT: Normocephalic, atraumatic.  Neck is supple, no supraclavicular or cervical adenopathy appreciated    Cardiovascular: Heart is of regular rate and rhythm  Pulmonary:  normal pulmonary effort and rate is observed  BREAST:    RIGHT breast: breast is symmetric in shape. Grade 3 ptosis. Folliculitis scarring to inferior breast. Negative for masses, dimpling or tenderness    Areola: consistent pigmentation, negative for ulceration.    Nipple:  No evidence of ulceration, discharge or retraction    Axilla: Negative for Palpable LN     LEFT breast: breast is symmetric in shape. Negative for masses, dimpling or tenderness. Grade 3 ptosis. Folliculitis scarring to inferior breast.    Areola: consistent pigmentation, negative for ulceration.    Nipple:  No evidence of ulceration, discharge or retraction    Axilla: Negative for Palpable LN  Abdomen: soft and non tender, non distended   Skin: warm and dry without rash  Psychiatric: Alert and Oriented.    Assessment and Plan:  Rachel Mendoza is a 47 y.o. 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  She presents for surgical recommendations.  Co-morbidities include asthma, GERD, HTN, hypothyroid, obstructive sleep apnea.     The distinction between invasive and non-invasive cancer as well as the difference between local and systemic therapy were outlined for the patient and her companions today.  Specifically, she was told there are two surgical treatment options for women diagnosed with breast cancer.      One is a lumpectomy, achieving negative surgical margins followed by radiation therapy; this combination is called breast conservation therapy (BCT). If the cancer is not palpable, the localization process entails preoperative placement of a radioactive seed or wire by a Radiologist in order to guide dissection of the tissue surrounding the biopsy site. The patient was quoted a 10-15% positive margin rate with this surgical treatment and if that were to occur, she is aware that another operative procedure would be necessary  as the final pathology is usually not available until 7-10 days after completion of the surgery.  In the event there are multiple surgical margins that are positive and this appears to be somewhat discordant with the imaging findings, the discussion may be centered on mastectomy instead of re-excision of margins.  However, if only a few margins are positive, a re-attempt at excision is a valid treatment option and in certain situations, even multiple re-excisions may be within reason.  However, if the volume of tissue loss to too significant to result in good cosmesis, the discussion will again be turned to mastectomy.     The second surgical treatment option is total mastectomy (TM) which can be done with or without immediate reconstruction.  The differences in the incisions made as well as the surgical technique for each type of mastectomy were discussed with her.  She was also told that if she chose mastectomy without immediate reconstruction, she has not burned any bridges  and will be able to seek reconstruction in a delayed fashion should that she wish to do so.      She was told that based on NSABP B-06, with 20 years of follow up data, there is no difference in overall survival when comparing BCT and TM, but that there is a slight increased risk of local recurrence with BCT, although not statistically significant.     We discussed axillary staging surgery including it's role in staging cancer and directing adjuvant treatment recommendations per current NCCN guidelines / ASBrS consensus guidelines.    The procedure of a sentinel lymph node biopsy was discussed. The SLNB technique, utilizing dual mapping with both nuclear tracer and blue dye to identify the first draining lymph nodes in the axilla was described.  Based on the results of the ACOSOG Z0011, IBCSG 23-01 and AMAROS trials, women with early stage, palpably node-negative breast cancer may not benefit from completion ALND.  We discussed the possibility  for a second surgery to complete the ALND if more than two sentinel lymph nodes are positive on final pathology.      We discussed the multidisciplinary approach to breast cancer treatment and the need for adjuvant therapy including radiation, anti-hormonal, and/or chemotherapy.  I emphasized the fact that these treatment decisions ultimately depend on several factors including her tumor biology, clinical stage, overall state of health, final pathology and possible tumor genomic testing.      Patient has already undergone genetic testing prior to surgical evaluation that did ot show any clinically significant variants.     All imaging and pathology results were reviewed with the patient.  Her case will be reviewed at our multidisciplinary tumor conference.      I think she is a good candidate for a right breast seed localized lumpectomy and sentinel lymph node biopsy with frozen section and possible axillary lymph node dissection and she agrees with this treatment plan. She is planning to meet with plastic surgery next week to discuss reconstruction options and will contact our clinic if she decides to pursue alternative surgical therapy. The procedure, rationale, risks, benefits alternatives and possible complications were discussed.  Informed consent was obtained. We discussed her comorbidities and how they may affect her surgery.       The patient and her family were given ample opportunity to ask questions, and all of their questions were answered to  their satisfaction.  They appear to understand the disease process and the above mentioned treatment options.  The patient received a folder containing printed information on breast cancer diasgnosis, treatment options, postoperative exercises, local support services, and professional contact information.  They were encouraged to call with any additional questions or concerns.     Px was seen in conjunction with collaborating physician, Dr. Fredric Mare.     Maryjean Ka, MD 10/15/2022, 11:00      I saw and examined the patient.  I reviewed the resident's note.  I agree with the findings and plan of care as documented in the resident's note.  Any exceptions/additions are edited/noted.    47yo female presents with RIGHT breast invasive ductal carcinoma, ER/PR/HER2 negative, Ki67 >90%, NG3, 29 x 22 x 18mm, 9:00, 9cm FN.  On exam I cannot palpate her mass which is 1.1cm on her ultrasound immediately prior to this appointment.  No axillary adenopathy is appreciated.  She has large breast volume with grade 3 ptosis.  She completed her neoadjuvant chemotherapy on 10/04/2022 which is the day she was  referred to my clinic.  She has not received surgical consultation prior to this appointment.  She did have 36 gene panel testing performed on 06/21/2022 through [facility] which was negative for mutation.  We discussed breast cancer in general as well as in her specific case.  The goals of neoadjuvant therapy were described from a surgical perspective.  She has had good response to chemotherapy and her mass is not palpable at that time.  She was offered either breast conservation or mastectomy.  We also discussed contralateral prophylactic mastectomy as she specifically had questions about whether or not that would be beneficial.  Pros and cons of each approach, logistics and expected recovery were discussed.  AT this time, Ms Sebastiani is motivated for a RIGHT breast seed-localized lumpectomy with SLNbx.  We will do intra-operative frozen sections and completion ALND if any nodes are positive.  Risks of scar, bleeding, infection, need for additional procedures, hematoma, seroma, poor cosmetic outcome and risks of anesthesia all discussed.  Patient voiced understanding and wishes to proceed with surgery. Consent was obtained.  Surgery has been scheduled.    The patient decided against pursuing consultation with plastic surgery at this time as she does not desire mastectomy.  I did cover types of  reconstruction and expected recoveries, but she is not interested any longer.        Docia Furl, MD

## 2022-10-15 NOTE — Addendum Note (Signed)
Addended by: Maryjean Ka on: 10/15/2022 12:40 PM     Modules accepted: Orders

## 2022-10-16 ENCOUNTER — Encounter (INDEPENDENT_AMBULATORY_CARE_PROVIDER_SITE_OTHER): Payer: Self-pay

## 2022-10-17 ENCOUNTER — Encounter (HOSPITAL_COMMUNITY): Payer: Self-pay

## 2022-10-17 ENCOUNTER — Inpatient Hospital Stay (HOSPITAL_COMMUNITY)
Admission: RE | Admit: 2022-10-17 | Discharge: 2022-10-17 | Disposition: A | Discharge status: Home / Self Care | Payer: Commercial Managed Care - PPO | Source: Ambulatory Visit

## 2022-10-18 ENCOUNTER — Ambulatory Visit (HOSPITAL_COMMUNITY): Payer: Commercial Managed Care - PPO

## 2022-10-21 ENCOUNTER — Ambulatory Visit (HOSPITAL_BASED_OUTPATIENT_CLINIC_OR_DEPARTMENT_OTHER): Payer: Self-pay | Admitting: PLASTIC AND RECONSTRUCTIVE SURGERY

## 2022-10-23 ENCOUNTER — Other Ambulatory Visit (HOSPITAL_BASED_OUTPATIENT_CLINIC_OR_DEPARTMENT_OTHER): Payer: Self-pay | Admitting: Surgery

## 2022-10-23 DIAGNOSIS — C50411 Malignant neoplasm of upper-outer quadrant of right female breast: Secondary | ICD-10-CM

## 2022-10-24 ENCOUNTER — Ambulatory Visit: Payer: Commercial Managed Care - PPO | Attending: Surgery

## 2022-10-24 DIAGNOSIS — R002 Palpitations: Secondary | ICD-10-CM

## 2022-10-24 DIAGNOSIS — Z720 Tobacco use: Secondary | ICD-10-CM

## 2022-10-24 DIAGNOSIS — K219 Gastro-esophageal reflux disease without esophagitis: Secondary | ICD-10-CM

## 2022-10-24 DIAGNOSIS — C50811 Malignant neoplasm of overlapping sites of right female breast: Secondary | ICD-10-CM

## 2022-10-24 DIAGNOSIS — C50919 Malignant neoplasm of unspecified site of unspecified female breast: Secondary | ICD-10-CM

## 2022-10-24 DIAGNOSIS — J45909 Unspecified asthma, uncomplicated: Secondary | ICD-10-CM

## 2022-10-24 DIAGNOSIS — Z01818 Encounter for other preprocedural examination: Secondary | ICD-10-CM

## 2022-10-24 DIAGNOSIS — I10 Essential (primary) hypertension: Secondary | ICD-10-CM

## 2022-10-24 NOTE — H&P (Signed)
Addendum:  none      Perioperative Evaluation Center  Department of Anesthesiology  Optimization Visit  History and Physical     Rachel Mendoza, Rachel Mendoza, 47 y.o. female Medical Record Number: [id number]   Date of Birth:  03-21-1976 Date of Service:  10/24/2022    Information Obtained from: patient Surgeon:  Surgeon(s):  Docia Furl, MD    Date of Surgery: 11/05/2022  Planned anesthesia: General   Estimated Case Length: 140 Min     TELEMEDICINE DOCUMENTATION:    Patient Location:  MyChart video visit from home address: [address]  [facility] [id number]    Patient/family aware of provider location:  yes  Patient/family consent for telemedicine:  yes  Examination observed and performed by:  Malachy Chamber, FNP-BC        CHIEF COMPLAINT  Pre-Operative Optimization Visit for LUMPECTOMY SEED LOCALIZATION AND SENTINEL LYMPH NODE MAPPING:   DISSECTION AXILLARY:  due to breast cancer.        RECOMMENDATION  SUMMARY: The surgery is considered elective.  The surgery is considered intermediate intensity .  Recommend  proceeding towards surgery.    Patient has the following outstanding needs for optimization:   None  Patient would benefit from active incentive spirometer, early ambulation following surgery and multi-modal pain management approach as appropriate.  DVT prophylaxis as appropriate.  Please see addendum for any updates after 10/24/2022.        ASSESSMENT & PLAN  1.) Preoperative Evaluation  Plan: EKG (NSR, 07/19/2022), ECHO (EF 65-70%, 05/07/2022), Labs (10/04/2022), CT CAP (06/03/2022), Reviewed. Proceed with surgery as scheduled.     2.) Breast Cancer  Assessment/ Plan: Patient reports that she has completed 16 rounds of chemotherapy, last approximately 3 weeks ago. Procedure for additional treatment. Refer to Surgical Oncology for recommendations.     3.) Hypertension  Assessment/Plan  - BP: Unable to assess, Telemedicine Visit.  Denies CP, frequent headaches, dizziness, vision changes.  EKG reviewed. Electrolytes  and creatinine/GFR reviewed. Follows w/ PCP. Prescribed losartan, hydrochlorothiazide. Continue monitoring/management as directed by PCP for continued follow-up.     4.) Asthma  Assessment: Denies recent URI or pneumonia.Lungs- Unable to assess, Telemedicine Visit.  Patient reports that they usually give her a breathing treatment as soon as she wakes up from anesthesia. Patient reports that she has approximately 2-3 flights of stairs to get into her apartment and since chemotherapy she has to stop once during all the steps to catch her breath.   Plan: CT CAP (06/03/2022). Continue albuterol as needed, claritin. Based on the ARISCAT Score, Patient is at low risk for post operative pulmonary complications. Advise early incentive spirometry and early mobilization to reduce atelectasis and pulmonary infection. Continue monitoring/management as directed by PCP.     5.) GERD  Assessment: Stable on Prilosec.   Plan: Continue diet modifications. Follow up with PCP as scheduled.     6.) Heart Palpitations  Assessment/ Plan: Patient reports that they were on occasion. She reports that the Cardizem has controlled them. EKG (NSR, 07/19/2022), ECHO (EF 65-70%, 05/07/2022). Patient reports the last episode of palpitations was during her chemotherapy. Continue Cardizem. Follow up with PCP as needed.     7.) Tobacco Use  Assessment: Patient reports 0.8 ppd. Smoked for 30+ years. States tried quitting in the past.   Plan: Educated to hold a minimum of 8 hours prior to procedure. 1-800-QUIT-NOW recommended. Encouragement given for smoking cessation.     MEDICATION INSTRUCTIONS    Take the following medications the  morning of surgery: albuterol as needed, Cardizem, gabapentin, synthroid, Claritin, Prilosec, Zoloft.     Hold the following medications the day of surgery: hydrochlorothiazide, magnesium.     Hold the following medications the night before and day of surgery: losartan.     Hold NSAIDs (Motrin, Advil, Aleve, Ibuprofen,  Naprosyn), vitamins, fish oil, and herbal supplements 7 days prior to the procedure.      DIET INSTRUCTIONS   Regular diet until 8 hours prior to arrival. Clear liquids until 2 hours prior to arrival. No tobacco/nicotine or mints for 8 hours prior to arrival.         HPI  Rachel Mendoza is a 47 y.o. female that presents with need for pre-operative optimization.      Patient with breast cancer. Patient is progressing towards LUMPECTOMY SEED LOCALIZATION AND SENTINEL LYMPH NODE MAPPING:   DISSECTION AXILLARY:  in the near future.      Operative Reference Anesthesia  ASA  3    METS  >4    History of difficult intubation No    Personal or family history of anesthesia problems Yes "After Hysterectomy they had a hard time waking me up, took hours"   OSA  Yes -Compliant with CPAP      Operative Reference Surgery  Allergy to the following: latex, metal/nickel, iodine/shellfish, acrylic, egg/propofol, all opioids No    Recent Hospitalization (within the last 3 months) No    Current Tobacco Use  Yes- Amount - 0.5  ppd Educated to hold a minimum of 8 hours prior to procedure.    Current Alcohol Use No    Current Drug Use  No      Assessment Tools   Hx of Lung Disease   Yes: Ariscat Score 16 points, low risk.    Concern for Cardiac Ischemia No   History of PE/DVT and/or Clotting Disorder No   History of Liver Disease No   Frailty Score No    Reference:     []  F= Fatigue in the past month? (More unusual fatigue than normal or inability to complete ADLs)  []  R= Resistance- difficulty climbing a flight of stairs?   []  A= Ambulation- difficulty walking one block on level land?  []   I= Illness (HTN, DM, Cancer (other than minor skin CA), Chronic Lung Disease, Heart Attack, CHF, Angina, Asthma, Arthritis, Stroke, CKD); 5+=1 point; <5=0  []   L= Loss of weight- have you lost >5% of your body weight in the past year?     STOP-BANG:       04/26/2022     6:15 AM   Stop Bang   Have you been Diagnosed with SLEEP APNEA (if yes do not need  to continue Stop Brennan Bailey) Yes            Past Medical History:  Past Medical History:   Diagnosis Date    Allergic rhinitis     Anal fissure     Asthma     Bradycardia     CPAP (continuous positive airway pressure) dependence     Depression     Dysrhythmias     tachy after anesthesia for back surgery leading to prolonged stay    Esophageal reflux     History of anesthesia complications     hx of slow to wake    Hx of breast cancer     Hypersomnia     Hypertension     Hypothyroidism     Insomnia, unspecified  Kidney stones 06/06/2021    Nerve damage     Obesity, unspecified     Shortness of breath     Sleep apnea     Tachycardia, unspecified     Thyroid disorder     Wears glasses      Past Surgical History:   Procedure Laterality Date    HX BACK SURGERY      HX BREAST BIOPSY Right     HX HAND SURGERY Left     left thumb    HX HYSTERECTOMY      HX LITHOTRIPSY      HX TONSILLECTOMY      HX TUBAL LIGATION Bilateral     PORTACATH PLACEMENT       Family Medical History:       Problem Relation (Age of Onset)    Asthma Brother, Maternal Grandmother    Breast Cancer Paternal Grandmother    Diabetes type II Father, Paternal Grandmother    Elevated Lipids Father    Heart Disease Father    Hypertension (High Blood Pressure) Mother, Father    No Known Problems Sister, Maternal Aunt, Maternal Uncle, Paternal Aunt, Paternal Uncle, Maternal Grandfather, Paternal Grandfather, Daughter, Son, Other                Review of Systems:  Review of Systems   Constitutional: Negative.   Skin: Negative.    HENT: Negative.   Exercise Tolerance: Negative for less than 4 METS.   Cardiovascular:  Positive for palpitations. Negative for chest pain and edema.   Respiratory:  Positive for shortness of breath (with exertion) and wheezing (asthma). Negative for shortness of breath (at rest).    Gastrointestinal:  Positive for heartburn.   Genitourinary: Negative.    Musculoskeletal:  Positive for back pain.   Family Anesthesia History: Negative for  malignant hyperthermia precautions and pseudocholinesterase deficiency.   Anesthesia: Positive for PONV. Negative for difficult intubation.   Neurological:  Positive for dizziness (with changing positions.), numbness and muscle weakness. Negative for headaches and syncope.        "After Hysterectomy they had a hard time waking me up, took hours"   Psychiatric/Behavioral:  Positive for depression, post-traumatic stress disorder and physiological symptoms of anxiety.        Exam:  There were no vitals taken for this visit.      There is no height or weight on file to calculate BMI.       Airway     Comment: Unable to assess- Telemedicine Visit.                     Dental                 Comment: Unable to assess- Telemedicine Visit.        Pulmonary    Comment: Unable to assess- Telemedicine Visit.          Cardiovascular    Comment: Unable to assess- Telemedicine Visit.            Other findings                  Malachy Chamber, FNP-BC  10/24/2022, 15:25    Perioperative Evaluation Center  [facility]  [place]    It should be emphasized that the [facility] Provider consultation is to assess the patient's medical conditions and provide recommendations to optimize the patient to decrease peri-operative complications.  Decision on whether or not to proceed  to surgery is made at the care team's discretion.

## 2022-10-25 ENCOUNTER — Ambulatory Visit
Admission: RE | Admit: 2022-10-25 | Discharge: 2022-10-25 | Disposition: A | Discharge status: Home / Self Care | Payer: Commercial Managed Care - PPO | Source: Ambulatory Visit | Attending: HEMATOLOGY-ONCOLOGY | Admitting: HEMATOLOGY-ONCOLOGY

## 2022-10-25 ENCOUNTER — Encounter (INDEPENDENT_AMBULATORY_CARE_PROVIDER_SITE_OTHER): Payer: Self-pay | Admitting: HEMATOLOGY-ONCOLOGY

## 2022-10-25 ENCOUNTER — Ambulatory Visit: Payer: Commercial Managed Care - PPO | Admitting: HEMATOLOGY-ONCOLOGY

## 2022-10-25 ENCOUNTER — Encounter (HOSPITAL_COMMUNITY): Payer: Self-pay

## 2022-10-25 ENCOUNTER — Ambulatory Visit (INDEPENDENT_AMBULATORY_CARE_PROVIDER_SITE_OTHER)
Admission: RE | Admit: 2022-10-25 | Discharge: 2022-10-25 | Disposition: A | Discharge status: Home / Self Care | Payer: Commercial Managed Care - PPO | Source: Ambulatory Visit | Attending: HEMATOLOGY-ONCOLOGY | Admitting: HEMATOLOGY-ONCOLOGY

## 2022-10-25 ENCOUNTER — Other Ambulatory Visit: Payer: Self-pay

## 2022-10-25 VITALS — BP 134/88 | HR 145 | Temp 97.3°F | Ht 65.0 in | Wt 250.0 lb

## 2022-10-25 VITALS — BP 129/72 | HR 104 | Temp 98.6°F | Resp 18

## 2022-10-25 DIAGNOSIS — Z171 Estrogen receptor negative status [ER-]: Secondary | ICD-10-CM

## 2022-10-25 DIAGNOSIS — Z9221 Personal history of antineoplastic chemotherapy: Secondary | ICD-10-CM | POA: Insufficient documentation

## 2022-10-25 DIAGNOSIS — C50411 Malignant neoplasm of upper-outer quadrant of right female breast: Secondary | ICD-10-CM

## 2022-10-25 DIAGNOSIS — Z5112 Encounter for antineoplastic immunotherapy: Secondary | ICD-10-CM | POA: Insufficient documentation

## 2022-10-25 LAB — COMPREHENSIVE METABOLIC PANEL, NON-FASTING
ALBUMIN/GLOBULIN RATIO: 1.1 (ref 0.8–1.4)
ALBUMIN: 4 g/dL (ref 3.5–5.7)
ALKALINE PHOSPHATASE: 90 U/L (ref 34–104)
ALT (SGPT): 26 U/L (ref 7–52)
ANION GAP: 15 mmol/L — ABNORMAL HIGH (ref 4–13)
AST (SGOT): 20 U/L (ref 13–39)
BILIRUBIN TOTAL: 0.2 mg/dL — ABNORMAL LOW (ref 0.3–1.2)
BUN/CREA RATIO: 15 (ref 6–22)
BUN: 13 mg/dL (ref 7–25)
CALCIUM, CORRECTED: 9.6 mg/dL (ref 8.9–10.8)
CALCIUM: 9.6 mg/dL (ref 8.6–10.3)
CHLORIDE: 100 mmol/L (ref 98–107)
CO2 TOTAL: 24 mmol/L (ref 21–31)
CREATININE: 0.87 mg/dL (ref 0.60–1.30)
ESTIMATED GFR: 83 mL/min/{1.73_m2} (ref >59)
GLOBULIN: 3.5 (ref 2.9–5.4)
GLUCOSE: 161 mg/dL — ABNORMAL HIGH (ref 74–109)
OSMOLALITY, CALCULATED: 281 mOsm/kg (ref 270–290)
POTASSIUM: 3.2 mmol/L — ABNORMAL LOW (ref 3.5–5.1)
PROTEIN TOTAL: 7.5 g/dL (ref 6.4–8.9)
SODIUM: 139 mmol/L (ref 136–145)

## 2022-10-25 LAB — CBC WITH DIFF
BASOPHIL #: 0.1 10*3/uL (ref 0.00–0.10)
BASOPHIL %: 1 % (ref 0–1)
EOSINOPHIL #: 0.1 10*3/uL (ref 0.00–0.50)
EOSINOPHIL %: 1 %
HCT: 33.4 % (ref 31.2–41.9)
HGB: 11.3 g/dL (ref 10.9–14.3)
LYMPHOCYTE #: 2.1 10*3/uL (ref 1.00–3.00)
LYMPHOCYTE %: 28 % (ref 16–44)
MCH: 35.1 pg — ABNORMAL HIGH (ref 24.7–32.8)
MCHC: 34 g/dL (ref 32.3–35.6)
MCV: 103.2 fL — ABNORMAL HIGH (ref 75.5–95.3)
MONOCYTE #: 0.4 10*3/uL (ref 0.30–1.00)
MONOCYTE %: 6 % (ref 5–13)
MPV: 7.7 fL — ABNORMAL LOW (ref 7.9–10.8)
NEUTROPHIL #: 4.7 10*3/uL (ref 1.85–7.80)
NEUTROPHIL %: 64 % (ref 43–77)
PLATELETS: 218 10*3/uL (ref 140–440)
RBC: 3.24 10*6/uL — ABNORMAL LOW (ref 3.63–4.92)
RDW: 14.5 % (ref 12.3–17.7)
WBC: 7.4 10*3/uL (ref 3.8–11.8)

## 2022-10-25 LAB — MAGNESIUM: MAGNESIUM: 1.6 mg/dL — ABNORMAL LOW (ref 1.9–2.7)

## 2022-10-25 LAB — THYROID STIMULATING HORMONE WITH FREE T4 REFLEX: TSH: 1.505 u[IU]/mL (ref 0.450–5.330)

## 2022-10-25 MED ORDER — FAMOTIDINE (PF) 20 MG/2 ML INTRAVENOUS SOLUTION
20.0000 mg | Freq: Once | INTRAVENOUS | Status: DC | PRN
Start: 2022-10-25 — End: 2022-10-26

## 2022-10-25 MED ORDER — MAGNESIUM SULFATE 1 GRAM/100 ML IN DEXTROSE 5 % INTRAVENOUS PIGGYBACK
1.0000 g | INJECTION | Freq: Once | INTRAVENOUS | Status: AC
Start: 2022-10-25 — End: 2022-10-25
  Administered 2022-10-25: 1 g via INTRAVENOUS
  Administered 2022-10-25: 0 g via INTRAVENOUS

## 2022-10-25 MED ORDER — EPINEPHRINE 1 MG/ML (1 ML) INJECTION SOLUTION
0.3000 mg | Freq: Once | INTRAMUSCULAR | Status: DC | PRN
Start: 2022-10-25 — End: 2022-10-26

## 2022-10-25 MED ORDER — PROCHLORPERAZINE EDISYLATE 10 MG/2 ML (5 MG/ML) INJECTION SOLUTION
10.0000 mg | Freq: Once | INTRAMUSCULAR | Status: AC
Start: 2022-10-25 — End: 2022-10-25
  Administered 2022-10-25: 10 mg via INTRAVENOUS

## 2022-10-25 MED ORDER — HYDROCORTISONE SOD SUCCINATE 100 MG/2 ML VIAL WRAPPER
100.0000 mg | Freq: Once | INTRAMUSCULAR | Status: DC | PRN
Start: 2022-10-25 — End: 2022-10-26

## 2022-10-25 MED ORDER — MEPERIDINE (PF) 25 MG/ML INJECTION SOLUTION
12.5000 mg | Freq: Once | INTRAMUSCULAR | Status: DC | PRN
Start: 2022-10-25 — End: 2022-10-26

## 2022-10-25 MED ORDER — SODIUM CHLORIDE 0.9 % INTRAVENOUS SOLUTION
200.0000 mg | Freq: Once | INTRAVENOUS | Status: AC
Start: 2022-10-25 — End: 2022-10-25
  Administered 2022-10-25: 200 mg via INTRAVENOUS
  Administered 2022-10-25: 0 mg via INTRAVENOUS
  Filled 2022-10-25: qty 8

## 2022-10-25 MED ORDER — ALBUTEROL SULFATE 2.5 MG/3 ML (0.083 %) SOLUTION FOR NEBULIZATION
2.5000 mg | INHALATION_SOLUTION | Freq: Once | RESPIRATORY_TRACT | Status: DC | PRN
Start: 2022-10-25 — End: 2022-10-26

## 2022-10-25 MED ORDER — ALBUTEROL SULFATE HFA 90 MCG/ACTUATION AEROSOL INHALER - RN
2.0000 | Freq: Once | RESPIRATORY_TRACT | Status: DC | PRN
Start: 2022-10-25 — End: 2022-10-26

## 2022-10-25 MED ORDER — PROCHLORPERAZINE EDISYLATE 10 MG/2 ML (5 MG/ML) INJECTION SOLUTION
INTRAMUSCULAR | Status: AC
Start: 2022-10-25 — End: 2022-10-25
  Filled 2022-10-25: qty 2

## 2022-10-25 MED ORDER — DIPHENHYDRAMINE 50 MG/ML INJECTION SOLUTION
25.0000 mg | Freq: Once | INTRAMUSCULAR | Status: DC | PRN
Start: 2022-10-25 — End: 2022-10-26

## 2022-10-25 MED ORDER — MAGNESIUM SULFATE 1 GRAM/100 ML IN DEXTROSE 5 % INTRAVENOUS PIGGYBACK
1.0000 g | INJECTION | Freq: Once | INTRAVENOUS | Status: AC
Start: 2022-10-25 — End: 2022-10-25
  Administered 2022-10-25: 0 g via INTRAVENOUS
  Administered 2022-10-25: 1 g via INTRAVENOUS

## 2022-10-25 MED ORDER — SODIUM CHLORIDE 0.9% FLUSH BAG - 250 ML
INTRAVENOUS | Status: DC | PRN
Start: 2022-10-25 — End: 2022-10-26

## 2022-10-25 MED ORDER — DEXTROSE 5% IN WATER (D5W) FLUSH BAG - 250 ML
INTRAVENOUS | Status: DC | PRN
Start: 2022-10-25 — End: 2022-10-26

## 2022-10-25 MED ORDER — DIPHENHYDRAMINE 50 MG/ML INJECTION SOLUTION
50.0000 mg | Freq: Once | INTRAMUSCULAR | Status: DC | PRN
Start: 2022-10-25 — End: 2022-10-26

## 2022-10-25 MED ORDER — MAGNESIUM SULFATE 1 GRAM/100 ML IN DEXTROSE 5 % INTRAVENOUS PIGGYBACK
INJECTION | INTRAVENOUS | Status: AC
Start: 2022-10-25 — End: 2022-10-25
  Filled 2022-10-25: qty 200

## 2022-10-25 NOTE — Progress Notes (Unsigned)
Department of Hematology/Oncology  History and Physical    Name: Rachel Mendoza  ZOX:[id number]  Date of Birth: May 28, 1975  Encounter Date: 10/25/2022    REFERRING PROVIDER:  No referring provider defined for this encounter.    TELEMEDICINE DOCUMENTATION:  Patient Location:  [facility] outpatient Hematology/Oncology [address], [place] [address]  Patient/family aware of provider location:  yes  Patient/family consent for telemedicine:  yes    REASON FOR OFFICE VISIT:  New patient for evaluation and management of triple negative breast cancer.    HISTORY OF PRESENT ILLNESS:  Rachel Mendoza is a 47 y.o. female who presents today for initial medical oncology consultation regarding triple negative breast cancer.  The patient discovered a lump which was evaluated with mammogram, ultrasound, and biopsy.  The primary lesion measures in the range of 2.2-2.9 cm, and there were some nearby lymph nodes that were described as prominent although not obviously involved.  The narrow measurement for the lymph nodes was in the range of 1.1 cm.    The biopsy showed invasive ductal malignancy that was triple negative.  BRCA testing is currently pending.  Based on the above information, she was referred for consideration of neoadjuvant chemotherapy.    05/10/2022: The patient is here for follow up of triple negative breast cancer.  We still do not have the BRCA result.  We have also been trying to obtain a pretreatment PET-CT, but have not been able to obtain approval for it yet.    05/31/2022: The patient is here for follow up of triple negative breast cancer.  She states that she has noticed a dramatic improvement in the size of the lesion.  She states that she struggles to palpate it now.    06/21/2022: The patient is here for follow up of triple negative breast cancer.  She states that she is having fatigue and various other issues related to treatment.    09/13/2022: The patient is here for  follow up of triple negative breast cancer. She states that she feels bad for approximately 10 days after chemotherapy treatments.     10/04/2022: The patient is here for follow up of triple negative breast cancer.  She states that she has a problem with dizziness when she stands up too quickly.  Otherwise, she is doing about the same.    10/25/2022: The patient is here for follow up of triple negative breast cancer.  Since the last visit, she did meet with a breast surgeon and the plan is to proceed with a lumpectomy at the end of the month.    ROS:   Review of Systems   Constitutional:  Negative for appetite change, chills and fatigue.   HENT:   Negative for sore throat and trouble swallowing.    Eyes:  Negative for eye problems.   Respiratory:  Negative for cough and shortness of breath.    Cardiovascular:  Negative for chest pain and leg swelling.   Gastrointestinal:  Negative for abdominal pain.   Genitourinary:  Negative for dysuria and hematuria.    Musculoskeletal:  Negative for arthralgias and gait problem.   Skin:  Negative for rash.   Neurological:  Negative for gait problem.   Hematological:  Negative for adenopathy.   Psychiatric/Behavioral:  Negative for depression.         HISTORY:  Past Medical History:   Diagnosis Date    Allergic rhinitis     Anal fissure     Asthma  Bradycardia     CPAP (continuous positive airway pressure) dependence     Depression     Dysrhythmias     tachy after anesthesia for back surgery leading to prolonged stay    Esophageal reflux     History of anesthesia complications     hx of slow to wake    Hx of breast cancer     Hypersomnia     Hypertension     Hypothyroidism     Insomnia, unspecified     Kidney stones 06/06/2021    Nerve damage     Obesity, unspecified     Shortness of breath     Sleep apnea     Tachycardia, unspecified     Thyroid disorder     Wears glasses          Past Surgical History:   Procedure Laterality Date    HX BACK SURGERY      HX BREAST BIOPSY  Right     HX HAND SURGERY Left     left thumb    HX HYSTERECTOMY      HX LITHOTRIPSY      HX TONSILLECTOMY      HX TUBAL LIGATION Bilateral     PORTACATH PLACEMENT           Social History     Socioeconomic History    Marital status: Married     Spouse name: Not on file    Number of children: Not on file    Years of education: Not on file    Highest education level: Not on file   Occupational History    Not on file   Tobacco Use    Smoking status: Every Day     Current packs/day: 0.50     Average packs/day: 0.5 packs/day for 36.5 years (18.3 ttl pk-yrs)     Types: Cigarettes     Start date: 04/08/1986     Passive exposure: Never    Smokeless tobacco: Never    Tobacco comments:     1800 quit now   Vaping Use    Vaping status: Never Used   Substance and Sexual Activity    Alcohol use: Not Currently     Comment: occasionally    Drug use: Yes     Types: Marijuana     Comment: CBD Gummies    Sexual activity: Not Currently   Other Topics Concern    Ability to Walk 1 Flight of Steps without SOB/CP No    Routine Exercise No    Ability to Walk 2 Flight of Steps without SOB/CP Not Asked    Unable to Ambulate Not Asked    Total Care Not Asked    Ability To Do Own ADL's Not Asked    Uses Walker No    Other Activity Level No     Comment: chemo has made her very weak, almost no activity    Uses Cane No   Social History Narrative    Not on file     Social Determinants of Health     Financial Resource Strain: Not on file   Transportation Needs: Not on file   Social Connections: Not on file   Intimate Partner Violence: Not on file   Housing Stability: Not on file     Family Medical History:       Problem Relation (Age of Onset)    Asthma Brother, Maternal Grandmother    Breast Cancer Paternal Grandmother  Diabetes type II Father, Paternal Grandmother    Elevated Lipids Father    Heart Disease Father    Hypertension (High Blood Pressure) Mother, Father    No Known Problems Sister, Maternal Aunt, Maternal Uncle, Paternal Aunt,  Paternal Uncle, Maternal Grandfather, Paternal Grandfather, Daughter, Son, Other            Current Outpatient Medications   Medication Sig    albuterol sulfate (PROVENTIL OR VENTOLIN OR PROAIR) 90 mcg/actuation Inhalation oral inhaler Take 1-2 Puffs by inhalation Every 6 hours as needed    beclomethasone dipropionate (QVAR REDIHALER) 80 mcg/actuation Inhalation oral inhaler Take 1 Puff by inhalation Twice daily    dicyclomine (BENTYL) 20 mg Oral Tablet Take 1 Tablet (20 mg total) by mouth Four times a day    dilTIAZem (CARDIZEM CD) 120 mg Oral Capsule, Sust. Release 24 hr Take 1 Capsule (120 mg total) by mouth Once a day    gabapentin (NEURONTIN) 300 mg Oral Capsule Take 1 Capsule (300 mg total) by mouth Four times a day    hydroCHLOROthiazide (HYDRODIURIL) 25 mg Oral Tablet Take 1 Tablet (25 mg total) by mouth Once a day    levothyroxine (SYNTHROID) 175 mcg Oral Tablet Take 1 Tablet (175 mcg total) by mouth Every morning    LIDOCAINE VISCOUS 2 % Mucous Membrane Solution Once per day as needed    lidocaine-prilocaine (EMLA) 2.5-2.5 % Cream APPLY TOPICALLY AS A ONE-TIME DOSE    loratadine (CLARITIN) 10 mg Oral Tablet Take 1 Tablet (10 mg total) by mouth Once a day    losartan (COZAAR) 50 mg Oral Tablet Take 1 Tablet (50 mg total) by mouth Once a day    Magic Mouthwash Swish and spit 10 mL Every 2 hours as needed for Sore throat Contains lidocaine viscous 2%, diphenhydramine 12.5 mg/5 ml, Maalox. Mix 1:1:1    magnesium Oxide 420 mg Oral Tablet Take 486 mg by mouth Twice daily    montelukast (SINGULAIR) 10 mg Oral Tablet Take 1 Tablet (10 mg total) by mouth Once a day    nystatin (MYCOSTATIN) 100,000 unit/mL Oral Suspension Take 5 mL by mouth Four times a day    omeprazole (PRILOSEC) 20 mg Oral Capsule, Delayed Release(E.C.) Take 1 Capsule (20 mg total) by mouth Once a day    potassium chloride (K-DUR) 20 mEq Oral Tab Sust.Rel. Particle/Crystal Take 1 Tablet (20 mEq total) by mouth Once a day (Patient taking  differently: Take 1 Tablet (20 mEq total) by mouth Once a day Has not been taking)    promethazine (PHENERGAN) 25 mg Oral Tablet TAKE 1 TABLET BY MOUTH EVERY 6 HOURS AS NEEDED FOR NAUSEA AND VOMITING    sertraline (ZOLOFT) 100 mg Oral Tablet Take 1 Tablet (100 mg total) by mouth Once a day     Allergies   Allergen Reactions    Dexamethasone (Pf) Swelling    Tamsulosin Nausea/ Vomiting       PHYSICAL EXAM:  Most Recent Vitals    Flowsheet Row Telemedicine from 04/24/2022 in Hematology/Oncology,   [facility]   Temperature 36.6 C (97.8 F) filed at... 04/24/2022 1338   Heart Rate 104 filed at... 04/24/2022 1338   Respiratory Rate --   BP (Non-Invasive) 147/99 filed at... 04/24/2022 1338   SpO2 96 % filed at... 04/24/2022 1338   Height 1.651 m (5\' 5" ) filed at... 04/24/2022 1338   Weight 121 kg (266 lb 6.4 oz) filed at... 04/24/2022 1338   BMI (Calculated) 44.42 filed at... 04/24/2022  1338   BSA (Calculated) 2.35 filed at... 04/24/2022 1338      ECOG Status: (0) Fully active, able to carry on all predisease performance without restriction   Physical Exam    DIAGNOSTIC DATA:  No results found for this or any previous visit (from the past 02725 hour(s)).    LABS:   CBC  Diff   Lab Results   Component Value Date/Time    WBC 7.4 10/25/2022 08:19 AM    HGB 11.3 10/25/2022 08:19 AM    HCT 33.4 10/25/2022 08:19 AM    PLTCNT 218 10/25/2022 08:19 AM    RBC 3.24 (L) 10/25/2022 08:19 AM    MCV 103.2 (H) 10/25/2022 08:19 AM    MCHC 34.0 10/25/2022 08:19 AM    MCH 35.1 (H) 10/25/2022 08:19 AM    RDW 14.5 10/25/2022 08:19 AM    MPV 7.7 (L) 10/25/2022 08:19 AM    Lab Results   Component Value Date/Time    PMNS 64 10/25/2022 08:19 AM    LYMPHOCYTES 28 10/25/2022 08:19 AM    EOSINOPHIL 1 10/25/2022 08:19 AM    MONOCYTES 6 10/25/2022 08:19 AM    BASOPHILS 1 10/25/2022 08:19 AM    BASOPHILS 0.10 10/25/2022 08:19 AM    PMNABS 4.70 10/25/2022 08:19 AM    LYMPHSABS 2.10 10/25/2022 08:19 AM    EOSABS 0.10 10/25/2022 08:19 AM     MONOSABS 0.40 10/25/2022 08:19 AM            ASSESSMENT:    ICD-10-CM    1. Malignant neoplasm of upper-outer quadrant of right breast in female, estrogen receptor negative (CMS HCC)  C50.411     Z17.1              PLAN:   1. All relevant medical records were reviewed including available pertinent provider notes, procedure notes, imaging, laboratory, and pathology.   2. All pertinent labs and/or imaging were reviewed with the patient.   3. Triple negative breast cancer:  We previously reviewed the staging information with the patient, and reviewed the NCCN guidelines.  She is either T2 N0 M0 or T2 N1 M0.  Either one of those would be categorized as stage II.  According to NCCN guidelines, this falls into the high-risk triple negative category. BRCA mutation status is negative.  She has completed the neoadjuvant chemotherapy regimen for high-risk triple negative breast cancer with the exception of pembrolizumab which will continue.  We will proceed with surgery towards the end of the month and I will see her back in a few weeks when the next cycle of treatment is due.    EVAH BUTRICK was given the chance to ask questions, and these were answered to their satisfaction. The patient is welcome to call with any questions or concerns in the meantime.     On the day of the encounter, a total of 35 minutes was spent on this patient encounter including review of historical information, examination, documentation and post-visit activities.   Return in about 3 weeks (around 11/15/2022).     Lupita Dawn, MD  10/25/2022, 08:51  The patient was seen as part of a collaborative telemedicine service with Dr. Damita Lack who participated in the encounter by active presence via approved video/audio means for portions of the encounter.  The patient's insurance company bears full legal and financial responsibility resulting from any deviations that they cause to my recommended treatment plan.   CC:  [name], FNP-BC  [address]  [place] [address]    No referring provider defined for this encounter.    This note was partially generated using MModal Fluency Direct system, and there may be some incorrect words, spellings, and punctuation that were not noted in checking the note before saving.

## 2022-10-25 NOTE — Nurses Notes (Addendum)
0911-Patient to room ambulatory.  Patient had recent fall and made fall risk.  Patient is oriented x4 and lungs sound clear.  Patient reports heart palpatations and dizziness when standing.  Patient reports all have been discussed with provider this morning.  Patient will have magnesium replacement today.  Patient reports provider discussed low potassium level and home medication will be increased.  Vitals were stable this morning.Salli Quarry, RN     Patient Assessment/Symptom Management Patient Has MD Appointment Today   Key: (+) Symptom present           (-)  Symptom not present If Symptom is Positive(+) a Nursing Note is required   Edema -   Uncontrolled Nausea -   Vomiting -   Inability to eat/drink -   Mouth Sores -   Diarrhea -   Constipation (? Last BM) -   Fatigue that interferes with ADL's +   Numbness/Tingling -change +   Heart palpitations + Provider aware   Fever/Signs & Symptoms of infection -   Nurse Initials KB         0930-Port accessed and flushed per protocol.  Blood return obtained.Salli Quarry, RN  0931-Magnesium infusion started.  Total 2 grams.Salli Quarry, RN  0959-Compazine given at this time for nausea.Salli Quarry, RN  1035-Magnesium infusion complete.Salli Quarry, RN  1050-KEYTRUDA infusion started.Salli Quarry, RN  1120-KEYTRUDA infusion complete.  Patient tolerated well.Salli Quarry, RN  1145-Port flushed per protocol and deaccessed.  Blood return obtained prior.Salli Quarry, RN  1150-Patient checking out at this time.  Vitals stable.Salli Quarry, RN

## 2022-10-28 ENCOUNTER — Other Ambulatory Visit (HOSPITAL_BASED_OUTPATIENT_CLINIC_OR_DEPARTMENT_OTHER): Payer: Self-pay | Admitting: Surgery

## 2022-10-28 DIAGNOSIS — Z171 Estrogen receptor negative status [ER-]: Secondary | ICD-10-CM

## 2022-10-28 DIAGNOSIS — C50911 Malignant neoplasm of unspecified site of right female breast: Secondary | ICD-10-CM

## 2022-10-28 LAB — SURGICAL PATHOLOGY CONSULT

## 2022-10-28 NOTE — Progress Notes (Signed)
MULTIDISCIPLINARY BREAST TUMOR BOARD DISCUSSION:    PATIENT:   Rachel Mendoza NUMBER:   [id number]  DOB:    1975/08/30  AGE:   47 y.o.  DATE:   10/28/2022    REFERRING PROVIDER: No ref. provider found  PCP: [name], FNP-BC    PRESENTER: Docia Furl, MD  TYPE OF PRESENTATION: Prospective  PATHOLOGY REVIEWED AT [place]?: Yes  RADIOGRAPHS REVIEWED AT [facility]?: Yes  NATIONAL GUIDELINES DISCUSSED?: Yes; NCCN    DIAGNOSIS: Left breast IDC  DIAGNOSIS LOCATION: Outside Facility  DIAGNOSIS METHOD: Ultrasound-guided Biopsy  CLINICAL STAGE:   Stage II  cT2, N0, M0 vs cT2, N1, M0  RECURRENT?: No    FAMILY HISTORY: Not Significant; Paternal grandmother w/ breast cancer  GENETIC TESTING?: Yes, negative   PROGNOSTIC INDICATORS DISCUSSED:  Yes    PATIENT HISTORY:   G: 3    P:  3     Menarche:  11      Menopause:  N/A   First Live Birth:  17   HRT:   No       OCP:   No    HORMONAL MARKERS:   ER: <1%   PR: <1%   HER2: Negative, 0   Ki-67: >90%    CLINICAL TRIAL PARTICIPATION:  No  ELIGIBLE CLINICAL TRIALS:  Possible candidate for clinical trial    NEED FOR PALLIATIVE CARE?: No  PSYCHOSOCIAL CONCERNS?: No  REHABILITATION CONCERNS?: No    NUTRITIONAL CONCERNS?:  No    PATIENT NAVIGATOR DISCUSSED OUTREACH ACTIVITIES/PROGRAMS?:  Yes  PATIENT NAVIGATOR PROVIDED EDUCATIONAL MATERIAL TO PATIENT?:  Yes    THE PATIENT'S MOST RECENT CLINICAL INFORMATION, IMAGING, AND PATHOLOGY WAS DISCUSSED TODAY AT [place] MULTIDISCIPLINARY BREAST TUMOR BOARD BY THE MEDICAL ONCOLOGY, RADIATION ONCOLOGY, SURGERY, RADIOLOGY, PATHOLOGY, SOCIAL SERVICES, GENETIC, REHAB, AND NURSING SERVICES.    PRELIMINARY RECOMMENDATIONS BASED ON TODAY'S TUMOR BOARD DISCUSSION:  -Plan for lumpectomy w/ SLNB  -Refer to rad-onc to evaluate for post-op RT  -Further treatment decisions will depend on final surgical pathology      Lenon Ahmadi  10/28/2022 10:32  Oncology Tumor Board Coordinator

## 2022-11-04 ENCOUNTER — Other Ambulatory Visit: Payer: Self-pay

## 2022-11-04 ENCOUNTER — Other Ambulatory Visit (HOSPITAL_BASED_OUTPATIENT_CLINIC_OR_DEPARTMENT_OTHER): Payer: Self-pay | Admitting: Surgery

## 2022-11-04 ENCOUNTER — Ambulatory Visit
Admission: RE | Admit: 2022-11-04 | Discharge: 2022-11-04 | Disposition: A | Discharge status: Home / Self Care | Payer: PRIVATE HEALTH INSURANCE | Source: Ambulatory Visit | Attending: Surgery | Admitting: Surgery

## 2022-11-04 ENCOUNTER — Ambulatory Visit (HOSPITAL_BASED_OUTPATIENT_CLINIC_OR_DEPARTMENT_OTHER)
Admission: RE | Admit: 2022-11-04 | Discharge: 2022-11-04 | Disposition: A | Discharge status: Home / Self Care | Payer: PRIVATE HEALTH INSURANCE | Source: Ambulatory Visit

## 2022-11-04 DIAGNOSIS — C50411 Malignant neoplasm of upper-outer quadrant of right female breast: Secondary | ICD-10-CM | POA: Insufficient documentation

## 2022-11-04 DIAGNOSIS — Z171 Estrogen receptor negative status [ER-]: Secondary | ICD-10-CM | POA: Insufficient documentation

## 2022-11-04 MED ORDER — LIDOCAINE HCL 10 MG/ML (1 %) INJECTION SOLUTION
3.0000 mL | Freq: Once | INTRAMUSCULAR | Status: AC
Start: 2022-11-04 — End: 2022-11-04
  Administered 2022-11-04: 30 mg via SUBCUTANEOUS

## 2022-11-04 NOTE — Nurses Notes (Signed)
Post Breast I-125 Seed placement instructions    The only way to remove a seed implanted in your breast is with surgery.  Now that you have your seed placed, you must return for surgery to have it removed.  There are no special instructions while the seed is in place.  People and objects around you are not at risk for radiation.  However, to ensure the utmost safety, we recommend that you not allow children to sit on your lap while the seed is in place.   While rare, if you notice the seed exiting your skin, do not handle the seed and keep others away from it. Call [phone] and ask the operator to page Radiation Safety to provide further instructions.       Thank you for allowing [place] to participate in your care.  Please call [phone] with any questions regarding your seed placement prior to your surgery on 11/05/2022.      Surgery Date: 11/05/2022.

## 2022-11-05 ENCOUNTER — Other Ambulatory Visit (HOSPITAL_COMMUNITY): Payer: Self-pay | Admitting: HEMATOLOGY-ONCOLOGY

## 2022-11-05 ENCOUNTER — Ambulatory Visit (HOSPITAL_COMMUNITY): Payer: Commercial Managed Care - PPO

## 2022-11-05 ENCOUNTER — Encounter (HOSPITAL_COMMUNITY): Payer: Self-pay | Admitting: Surgery

## 2022-11-05 ENCOUNTER — Ambulatory Visit (HOSPITAL_COMMUNITY): Payer: Commercial Managed Care - PPO | Admitting: Surgery

## 2022-11-05 ENCOUNTER — Ambulatory Visit (HOSPITAL_COMMUNITY): Payer: Commercial Managed Care - PPO | Admitting: ANESTHESIOLOGY

## 2022-11-05 ENCOUNTER — Encounter (HOSPITAL_COMMUNITY)
Admission: RE | Disposition: A | Discharge status: Home / Self Care | Payer: Self-pay | Source: Ambulatory Visit | Attending: Surgery

## 2022-11-05 ENCOUNTER — Inpatient Hospital Stay
Admission: RE | Admit: 2022-11-05 | Discharge: 2022-11-05 | Disposition: A | Discharge status: Home / Self Care | Payer: Commercial Managed Care - PPO | Source: Ambulatory Visit | Attending: Surgery | Admitting: Surgery

## 2022-11-05 ENCOUNTER — Ambulatory Visit (HOSPITAL_BASED_OUTPATIENT_CLINIC_OR_DEPARTMENT_OTHER): Payer: Commercial Managed Care - PPO | Admitting: ANESTHESIOLOGY

## 2022-11-05 DIAGNOSIS — Z6841 Body Mass Index (BMI) 40.0 and over, adult: Secondary | ICD-10-CM | POA: Insufficient documentation

## 2022-11-05 DIAGNOSIS — G4733 Obstructive sleep apnea (adult) (pediatric): Secondary | ICD-10-CM | POA: Insufficient documentation

## 2022-11-05 DIAGNOSIS — R112 Nausea with vomiting, unspecified: Secondary | ICD-10-CM

## 2022-11-05 DIAGNOSIS — C50411 Malignant neoplasm of upper-outer quadrant of right female breast: Secondary | ICD-10-CM | POA: Insufficient documentation

## 2022-11-05 DIAGNOSIS — E669 Obesity, unspecified: Secondary | ICD-10-CM | POA: Insufficient documentation

## 2022-11-05 DIAGNOSIS — E039 Hypothyroidism, unspecified: Secondary | ICD-10-CM | POA: Insufficient documentation

## 2022-11-05 DIAGNOSIS — F32A Depression, unspecified: Secondary | ICD-10-CM | POA: Insufficient documentation

## 2022-11-05 DIAGNOSIS — J45909 Unspecified asthma, uncomplicated: Secondary | ICD-10-CM | POA: Insufficient documentation

## 2022-11-05 DIAGNOSIS — I10 Essential (primary) hypertension: Secondary | ICD-10-CM | POA: Insufficient documentation

## 2022-11-05 DIAGNOSIS — Z171 Estrogen receptor negative status [ER-]: Secondary | ICD-10-CM

## 2022-11-05 DIAGNOSIS — K219 Gastro-esophageal reflux disease without esophagitis: Secondary | ICD-10-CM | POA: Insufficient documentation

## 2022-11-05 DIAGNOSIS — Z87442 Personal history of urinary calculi: Secondary | ICD-10-CM | POA: Insufficient documentation

## 2022-11-05 DIAGNOSIS — I499 Cardiac arrhythmia, unspecified: Secondary | ICD-10-CM | POA: Insufficient documentation

## 2022-11-05 SURGERY — LUMPECTOMY SEED LOCALIZATION AND SENTINEL LYMPH NODE MAPPING
Anesthesia: General | Site: Breast | Laterality: Right | Wound class: Clean Wound: Uninfected operative wounds in which no inflammation occurred

## 2022-11-05 MED ORDER — PROPOFOL 10 MG/ML IV BOLUS
INJECTION | Freq: Once | INTRAVENOUS | Status: DC | PRN
Start: 2022-11-05 — End: 2022-11-05
  Administered 2022-11-05: 200 mg via INTRAVENOUS

## 2022-11-05 MED ORDER — DEXTROSE 5% IN WATER (D5W) FLUSH BAG - 250 ML
INTRAVENOUS | Status: DC | PRN
Start: 2022-11-05 — End: 2022-11-05

## 2022-11-05 MED ORDER — SODIUM CHLORIDE 0.9 % (FLUSH) INJECTION SYRINGE
2.0000 mL | INJECTION | INTRAMUSCULAR | Status: DC | PRN
Start: 2022-11-05 — End: 2022-11-05

## 2022-11-05 MED ORDER — FENTANYL (PF) 50 MCG/ML INJECTION SOLUTION
INTRAMUSCULAR | Status: AC
Start: 2022-11-05 — End: 2022-11-05
  Filled 2022-11-05: qty 2

## 2022-11-05 MED ORDER — BUPIVACAINE (PF) 0.25 % (2.5 MG/ML) INJECTION SOLUTION
Freq: Once | INTRAMUSCULAR | Status: DC | PRN
Start: 2022-11-05 — End: 2022-11-05

## 2022-11-05 MED ORDER — LIDOCAINE (PF) 100 MG/5 ML (2 %) INTRAVENOUS SYRINGE
INJECTION | Freq: Once | INTRAVENOUS | Status: DC | PRN
Start: 2022-11-05 — End: 2022-11-05
  Administered 2022-11-05: 100 mg via INTRAVENOUS

## 2022-11-05 MED ORDER — LACTATED RINGERS INTRAVENOUS SOLUTION
INTRAVENOUS | Status: DC
Start: 2022-11-05 — End: 2022-11-05

## 2022-11-05 MED ORDER — OXYCODONE 10 MG TABLET
10.0000 mg | ORAL_TABLET | Freq: Once | ORAL | Status: DC | PRN
Start: 2022-11-05 — End: 2022-11-05

## 2022-11-05 MED ORDER — ATROPINE 0.4 MG/ML INJECTION SOLUTION
INTRAMUSCULAR | Status: AC
Start: 2022-11-05 — End: 2022-11-05
  Filled 2022-11-05: qty 1

## 2022-11-05 MED ORDER — FENTANYL (PF) 50 MCG/ML INJECTION SOLUTION
Freq: Once | INTRAMUSCULAR | Status: DC | PRN
Start: 2022-11-05 — End: 2022-11-05
  Administered 2022-11-05: 100 ug via INTRAVENOUS

## 2022-11-05 MED ORDER — SODIUM CHLORIDE 0.9% FLUSH BAG - 250 ML
INTRAVENOUS | Status: DC | PRN
Start: 2022-11-05 — End: 2022-11-05

## 2022-11-05 MED ORDER — HYDROMORPHONE (PF) 0.5 MG/0.5 ML INJECTION SYRINGE
INJECTION | Freq: Once | INTRAMUSCULAR | Status: DC | PRN
Start: 2022-11-05 — End: 2022-11-05
  Administered 2022-11-05 (×2): .5 mg via INTRAVENOUS

## 2022-11-05 MED ORDER — HYDROMORPHONE (PF) 0.5 MG/0.5 ML INJECTION SYRINGE
INJECTION | INTRAMUSCULAR | Status: AC
Start: 2022-11-05 — End: 2022-11-05
  Filled 2022-11-05: qty 0.5

## 2022-11-05 MED ORDER — LIDOCAINE (PF) 20 MG/ML (2 %) INJECTION SOLUTION
INTRAMUSCULAR | Status: AC
Start: 2022-11-05 — End: 2022-11-05
  Filled 2022-11-05: qty 5

## 2022-11-05 MED ORDER — ONDANSETRON HCL (PF) 4 MG/2 ML INJECTION SOLUTION
INTRAMUSCULAR | Status: AC
Start: 2022-11-05 — End: 2022-11-05
  Filled 2022-11-05: qty 2

## 2022-11-05 MED ORDER — APREPITANT 40 MG CAPSULE
40.0000 mg | ORAL_CAPSULE | Freq: Once | ORAL | Status: AC
Start: 2022-11-05 — End: 2022-11-05
  Administered 2022-11-05: 40 mg via ORAL
  Filled 2022-11-05: qty 1

## 2022-11-05 MED ORDER — ONDANSETRON HCL (PF) 4 MG/2 ML INJECTION SOLUTION
Freq: Once | INTRAMUSCULAR | Status: DC | PRN
Start: 2022-11-05 — End: 2022-11-05
  Administered 2022-11-05: 4 mg via INTRAVENOUS

## 2022-11-05 MED ORDER — HYDROMORPHONE (PF) 0.5 MG/0.5 ML INJECTION SYRINGE
0.2000 mg | INJECTION | INTRAMUSCULAR | Status: DC | PRN
Start: 2022-11-05 — End: 2022-11-05
  Administered 2022-11-05 (×2): 0.2 mg via INTRAVENOUS
  Filled 2022-11-05 (×2): qty 0.5

## 2022-11-05 MED ORDER — EPHEDRINE SULFATE 5 MG/ML INTRAVENOUS SOLUTION
INTRAVENOUS | Status: AC
Start: 2022-11-05 — End: 2022-11-05
  Filled 2022-11-05: qty 10

## 2022-11-05 MED ORDER — PROPOFOL 10 MG/ML INTRAVENOUS EMULSION
INTRAVENOUS | Status: AC
Start: 2022-11-05 — End: 2022-11-05
  Filled 2022-11-05: qty 20

## 2022-11-05 MED ORDER — MIDAZOLAM (PF) 1 MG/ML INJECTION SOLUTION
Freq: Once | INTRAMUSCULAR | Status: DC | PRN
Start: 2022-11-05 — End: 2022-11-05
  Administered 2022-11-05: 2 mg via INTRAVENOUS

## 2022-11-05 MED ORDER — DEXTROSE 5 % IN WATER (D5W) INTRAVENOUS SOLUTION
2.0000 g | Freq: Once | INTRAVENOUS | Status: AC
Start: 2022-11-05 — End: 2022-11-05
  Administered 2022-11-05: 2 g via INTRAVENOUS
  Filled 2022-11-05: qty 10

## 2022-11-05 MED ORDER — KETAMINE 10 MG/ML INJECTION WRAPPER
INTRAMUSCULAR | Status: AC
Start: 2022-11-05 — End: 2022-11-05
  Filled 2022-11-05: qty 5

## 2022-11-05 MED ORDER — SODIUM CHLORIDE 0.9 % IRRIGATION SOLUTION
1000.0000 mL | Status: DC | PRN
Start: 2022-11-05 — End: 2022-11-05
  Administered 2022-11-05: 1000 mL

## 2022-11-05 MED ORDER — GLYCOPYRROLATE 0.2 MG/ML INJECTION SOLUTION
Freq: Once | INTRAMUSCULAR | Status: DC | PRN
Start: 2022-11-05 — End: 2022-11-05
  Administered 2022-11-05: .2 mg via INTRAVENOUS

## 2022-11-05 MED ORDER — SODIUM CHLORIDE 0.9 % (FLUSH) INJECTION SYRINGE
2.0000 mL | INJECTION | Freq: Three times a day (TID) | INTRAMUSCULAR | Status: DC
Start: 2022-11-05 — End: 2022-11-05

## 2022-11-05 MED ORDER — ACETAMINOPHEN 1,000 MG/100 ML (10 MG/ML) INTRAVENOUS SOLUTION
Freq: Once | INTRAVENOUS | Status: DC | PRN
Start: 2022-11-05 — End: 2022-11-05
  Administered 2022-11-05: 1000 mg via INTRAVENOUS

## 2022-11-05 MED ORDER — GLYCOPYRROLATE 0.2 MG/ML INJECTION SOLUTION
INTRAMUSCULAR | Status: AC
Start: 2022-11-05 — End: 2022-11-05
  Filled 2022-11-05: qty 1

## 2022-11-05 MED ORDER — SCOPOLAMINE 1 MG OVER 3 DAYS TRANSDERMAL PATCH
1.0000 | MEDICATED_PATCH | Freq: Once | TRANSDERMAL | Status: DC
Start: 2022-11-05 — End: 2022-11-05
  Administered 2022-11-05: 1 via TRANSDERMAL
  Filled 2022-11-05: qty 1

## 2022-11-05 MED ORDER — HALOPERIDOL LACTATE 5 MG/ML INJECTION SOLUTION
1.0000 mg | Freq: Once | INTRAMUSCULAR | Status: DC | PRN
Start: 2022-11-05 — End: 2022-11-05

## 2022-11-05 MED ORDER — PROCHLORPERAZINE EDISYLATE 10 MG/2 ML (5 MG/ML) INJECTION SOLUTION
5.0000 mg | Freq: Once | INTRAMUSCULAR | Status: DC | PRN
Start: 2022-11-05 — End: 2022-11-05

## 2022-11-05 MED ORDER — ALBUTEROL SULFATE 2.5 MG/3 ML (0.083 %) SOLUTION FOR NEBULIZATION
2.5000 mg | INHALATION_SOLUTION | Freq: Once | RESPIRATORY_TRACT | Status: DC | PRN
Start: 2022-11-05 — End: 2022-11-05

## 2022-11-05 MED ORDER — HYDROMORPHONE (PF) 0.5 MG/0.5 ML INJECTION SYRINGE
0.4000 mg | INJECTION | INTRAMUSCULAR | Status: DC | PRN
Start: 2022-11-05 — End: 2022-11-05

## 2022-11-05 MED ORDER — OXYCODONE 5 MG TABLET
5.0000 mg | ORAL_TABLET | Freq: Once | ORAL | Status: DC | PRN
Start: 2022-11-05 — End: 2022-11-05
  Administered 2022-11-05: 5 mg via ORAL
  Filled 2022-11-05: qty 1

## 2022-11-05 MED ORDER — KETAMINE 10 MG/ML INJECTION WRAPPER
Freq: Once | INTRAMUSCULAR | Status: DC | PRN
Start: 2022-11-05 — End: 2022-11-05
  Administered 2022-11-05: 50 mg via INTRAVENOUS

## 2022-11-05 MED ORDER — MIDAZOLAM 1 MG/ML INJECTION SOLUTION
INTRAMUSCULAR | Status: AC
Start: 2022-11-05 — End: 2022-11-05
  Filled 2022-11-05: qty 2

## 2022-11-05 MED ORDER — ISOSULFAN BLUE 1 % SUBCUTANEOUS SOLUTION
5.0000 mL | Freq: Once | SUBCUTANEOUS | Status: DC | PRN
Start: 2022-11-05 — End: 2022-11-05
  Administered 2022-11-05: 5 mL via INTRAMUSCULAR

## 2022-11-05 MED ORDER — DEXAMETHASONE SODIUM PHOSPHATE 4 MG/ML INJECTION SOLUTION
INTRAMUSCULAR | Status: AC
Start: 2022-11-05 — End: 2022-11-05
  Filled 2022-11-05: qty 1

## 2022-11-05 SURGICAL SUPPLY — 29 items
APPLIER PREM SRGCLP III 9IN 20 CLIP TI SM INTERNAL CLIP DISP (WOUND CARE SUPPLY) ×1 IMPLANT
APPLIER PREM SRGCLP III 9IN 20 CLIP TI SM INTERNAL CLIP DISP (WOUND CARE/ENTEROSTOMAL SUPPLY) ×1
ARMBRD POSITION 20X8X2IN DVN FOAM (IV TUBING & ACCESSORIES) ×2 IMPLANT
BLANKET MISTRAL-AIR ADULT LWR BODY 55.9X40.2IN FRC AIR HI VOL BLWR INTUITIVE CONTROL PNL LRG LED (MED SURG SUPPLIES) ×2 IMPLANT
BRA POST SURGICAL WHITE LARGE (WOUND CARE SUPPLY) IMPLANT
BRA POST SURGICAL WHITE LARGE (WOUND CARE/ENTEROSTOMAL SUPPLY)
BRA POST SURGICAL WHITE MED (WOUND CARE SUPPLY) IMPLANT
BRA POST SURGICAL WHITE MED (WOUND CARE/ENTEROSTOMAL SUPPLY)
BRA QUEEN SHLDR STRAP LACE TRM FRN CLSR POST SURG LYCRA VELCRO COTTON WHT 1OZ (WOUND CARE SUPPLY) ×1 IMPLANT
BRA QUEEN SHLDR STRAP LACE TRM FRN CLSR POST SURG LYCRA VELCRO COTTON WHT 1OZ (WOUND CARE/ENTEROSTOMAL SUPPLY) ×1
CONV USE 329830 - SHEET TBL SURG STAT-BLOC (DRAPE/PACKS/SHEETS/OR TOWEL) ×2
CONV USE 338657 - PACK SURG BRST NONST DISP LF (CUSTOM TRAYS & PACK) ×2 IMPLANT
COVER CAM LEN DISP CLR STRL LF (DRAPE/PACKS/SHEETS/OR TOWEL) ×2 IMPLANT
COVER PROBE CIV-CLR 48IBX5.5IN 3 DIMENSION TELESCOPICAL FOLD STRL LF (MED SURG SUPPLIES) ×2 IMPLANT
DRAIN INCS 15FR JP SIL CHNL TROCAR STRL LF  RND DISP WHT (MED SURG SUPPLIES) IMPLANT
ELECTRODE ESURG BLADE PNCL 10FT VLAB STRL SS DISP BUTTON SWH HEX LOCK CORD HLSTR LF  ACPT 3/32IN STD (CUTTING ELEMENTS) ×1
ELECTRODE ESURG BLADE PNCL 10FT VLAB STRL SS DISP BUTTON SWH HEX LOCK CORD HLSTR LF  ACPT 3/32IN STD (SURGICAL CUTTING SUPPLIES) ×1 IMPLANT
NEEDLE HYPO  18GA 1.5IN REG WL BD PRCSNGL POLYPROP REG BVL LL HUB CLR CD DEHP-FR STRL LF  DISP (MED SURG SUPPLIES) ×2 IMPLANT
PACK SURG BRST NONST DISP LF (CUSTOM TRAYS & PACK) ×2
PAD DRESS 4X3IN MDCHC NONADH NWVN LF  STRL DISP WHT (WOUND CARE SUPPLY) ×2 IMPLANT
PAD DRESS 4X3IN MDCHC NONADH NWVN LF  STRL DISP WHT (WOUND CARE/ENTEROSTOMAL SUPPLY) ×2
RESERVOIR DRAIN SIL JP BULB 100CC STRL LF  DISP (MED SURG SUPPLIES) IMPLANT
SHEET TBL SURG STAT-BLOC (DRAPE/PACKS/SHEETS/OR TOWEL) ×2 IMPLANT
SPONGE GA 2X2IN MDCHC COTTON 8P LF  STRL DISP WHT (WOUND CARE SUPPLY) ×2 IMPLANT
SPONGE GA 2X2IN MDCHC COTTON 8P LF  STRL DISP WHT (WOUND CARE/ENTEROSTOMAL SUPPLY) ×2
SPONGE GAUZE 6.75X6IN COTTON SUP FLF DIAMOND FOLD LOFT WOVEN LF  STRL DISP (WOUND CARE SUPPLY) ×1 IMPLANT
SPONGE GAUZE 6.75X6IN COTTON SUP FLF DIAMOND FOLD LOFT WOVEN LF  STRL DISP (WOUND CARE/ENTEROSTOMAL SUPPLY) ×1
STRIP 4X.5IN STRSTRP PLSTR REINF SKNCLS WHT STRL LF (WOUND CARE SUPPLY) IMPLANT
STRIP 4X.5IN STRSTRP PLSTR REINF SKNCLS WHT STRL LF (WOUND CARE/ENTEROSTOMAL SUPPLY)

## 2022-11-05 NOTE — Anesthesia Preprocedure Evaluation (Signed)
ANESTHESIA PRE-OP EVALUATION  Planned Procedure: LUMPECTOMY SEED LOCALIZATION AND SENTINEL LYMPH NODE MAPPING (Right)  DISSECTION AXILLARY (Right)  Review of Systems    PONV                 Pulmonary   asthma, shortness of breath and sleep apnea,   Cardiovascular    Hypertension, dysrhythmias, DOE and ECG reviewed ,No CAD, no CHF and no atrial fibrillation,        GI/Hepatic/Renal    GERD and kidney stones        Endo/Other    hypothyroidism and obesity,      Neuro/Psych/MS    depression     Cancer    breast cancer,                     Physical Assessment      Airway       Mallampati: II    TM distance: 3 FB    Neck ROM: full  Mouth Opening: good.  No Facial hair  No Beard  No endotracheal tube present  No Tracheostomy present    Dental           (+) missing           Pulmonary      (+) wheezes present        Cardiovascular    Rhythm: regular  Rate: Normal       Other findings              Plan  ASA 3     Planned anesthesia type: general     general anesthesia with laryngeal mask airway      PONV Plan:  I plan to administer pharmcologic prophalaxis antiemetics  POV PLAN:   plan for postoperative opioid use    SLEEP APNEA  Patient is at risk of obstructive sleep apnea        Intravenous induction     Anesthesia issues/risks discussed are: Dental Injuries, Nerve Injuries, PONV, Eye /Visual Loss, Post-op Cognitive Dysfunction, Cardiac Events/MI, Intraoperative Awareness/ Recall, Blood Loss, Sore Throat, Aspiration, Stroke, Post-op Pain Management and Post-op Intubation/Ventilation.  Anesthetic plan and risks discussed with patient  signed consent obtained      Use of blood products discussed with patient who consented to blood products.      Patient's NPO status is appropriate for Anesthesia.           Plan discussed with CRNA.

## 2022-11-05 NOTE — Anesthesia Postprocedure Evaluation (Signed)
Anesthesia Post Op Evaluation    Patient: Rachel Mendoza  Procedure(s) with comments:  LUMPECTOMY SEED LOCALIZATION AND SENTINEL LYMPH NODE MAPPING - BCC Seed Loc done on 7/29  SLN Inj bedside @ 0645  FRZ    Last Vitals:Temperature: 36.4 C (97.5 F) (11/05/22 1030)  Heart Rate: 88 (11/05/22 1030)  BP (Non-Invasive): (!) 118/91 (11/05/22 1030)  Respiratory Rate: 13 (11/05/22 1030)  SpO2: 94 % (11/05/22 1045)    No notable events documented.    Patient is sufficiently recovered from the effects of anesthesia to participate in the evaluation and has returned to their pre-procedure level.  Patient location during evaluation: PACU       Patient participation: complete - patient participated  Level of consciousness: awake and alert  Multimodal Pain Management: Multimodal analgesia used between 6 hours prior to anesthesia start to PACU discharge  Pain management: adequate  Airway patency: patent    Anesthetic complications: no  Cardiovascular status: acceptable  Respiratory status: acceptable  Hydration status: acceptable  Patient post-procedure temperature: Pt Normothermic   PONV Status: Absent

## 2022-11-05 NOTE — Discharge Summary (Signed)
[facility]  DISCHARGE SUMMARY    PATIENT NAME:  Rachel Mendoza, Rachel Mendoza  MRN:  [id number]  DOB:  1975/12/25    ENCOUNTER DATE:  11/05/2022  INPATIENT ADMISSION DATE:   DISCHARGE DATE:  11/05/2022    ATTENDING PHYSICIAN: Docia Furl, MD  SERVICE: SURG ONCOLOGY  PRIMARY CARE PHYSICIAN: [name], FNP-BC       No lay caregiver identified.    PRIMARY DISCHARGE DIAGNOSIS:    There are no hospital problems to display for this patient.    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) 03/24/2022    Primary hypertension 09/10/2021    Thumb pain, left 09/10/2021    Oral candida 09/10/2021    History of depression 09/10/2021    Chronic lower back pain 06/06/2021    Obstructive sleep apnea 06/06/2021    Kidney stones 06/06/2021    Hypersomnia, unspecified 05/01/2021    Fatigue 05/01/2021    Obesity, unspecified 05/01/2021    Gastroesophageal reflux disease 05/01/2021    Hypothyroidism 09/19/2020    Bradycardia 09/19/2020    Tachycardia 08/11/2020    Moderate persistent asthma without complication 08/11/2020           Current Discharge Medication List        CONTINUE these medications - NO CHANGES were made during your visit.        Details   albuterol sulfate 90 mcg/actuation oral inhaler  Commonly known as: PROVENTIL or VENTOLIN or PROAIR   1-2 Puffs, Inhalation, EVERY 6 HOURS PRN  Qty: 1 Each  Refills: 2     beclomethasone dipropionate 80 mcg/actuation oral inhaler  Commonly known as: QVAR RediHaler   1 Puff, Inhalation, 2 TIMES DAILY  Qty: 10.6 g  Refills: 4     dicyclomine 20 mg Tablet  Commonly known as: BENTYL   20 mg, Oral, 4 TIMES DAILY  Qty: 120 Tablet  Refills: 1     dilTIAZem 120 mg Capsule, Sust. Release 24 hr  Commonly known as: CARDIZEM CD   120 mg, Oral, DAILY  Qty: 90 Capsule  Refills: 1     gabapentin 300 mg Capsule  Commonly known as:  NEURONTIN   300 mg, Oral, 4 TIMES DAILY  Refills: 0     hydroCHLOROthiazide 25 mg Tablet  Commonly known as: HYDRODIURIL   25 mg, Oral, DAILY  Qty: 90 Tablet  Refills: 1     levothyroxine 175 mcg Tablet  Commonly known as: SYNTHROID   175 mcg, Oral, EVERY MORNING  Qty: 90 Tablet  Refills: 1     Lidocaine Viscous 2 % Solution  Generic drug: lidocaine HCL   DAILY PRN  Refills: 0     lidocaine, diphenhydramine, maalox Solution  Commonly known as: MAGIC MOUTHWASH   10 mL, Swish & Spit, EVERY 2 HOURS PRN, Contains lidocaine viscous 2%, diphenhydramine 12.5 mg/5 ml, Maalox. Mix 1:1:1  Qty: 120 mL  Refills: 0     lidocaine-prilocaine 2.5-2.5 % Cream  Commonly known as: EMLA   APPLY TOPICALLY AS A ONE-TIME DOSE  Refills: 0     loratadine 10 mg Tablet  Commonly known as: CLARITIN   10 mg, Oral, DAILY  Qty: 90 Tablet  Refills: 1     losartan 50 mg Tablet  Commonly known as: COZAAR   50 mg, Oral, DAILY  Qty: 90 Tablet  Refills: 1     magnesium Oxide 420 mg Tablet   486 mg, Oral, 2 TIMES DAILY  Refills: 0     montelukast 10 mg Tablet  Commonly known as: SINGULAIR   10 mg, Oral, DAILY  Qty: 90 Tablet  Refills: 1     nystatin 100,000 unit/mL Suspension  Commonly known as: MYCOSTATIN   5 mL, Oral, 4 TIMES DAILY  Qty: 200 mL  Refills: 1     omeprazole 20 mg Capsule, Delayed Release(E.C.)  Commonly known as: PRILOSEC   20 mg, Oral, DAILY  Qty: 90 Capsule  Refills: 1     potassium chloride 20 mEq Tab Sust.Rel. Particle/Crystal  Commonly known as: K-DUR   20 mEq, Oral, DAILY  Qty: 30 Tablet  Refills: 5     promethazine 25 mg Tablet  Commonly known as: PHENERGAN   TAKE 1 TABLET BY MOUTH EVERY 6 HOURS AS NEEDED FOR NAUSEA AND VOMITING  Qty: 30 Tablet  Refills: 0     sertraline 100 mg Tablet  Commonly known as: ZOLOFT   100 mg, Oral, DAILY  Qty: 90 Tablet  Refills: 1            Discharge med list refreshed?  YES     Allergies   Allergen Reactions    Dexamethasone (Pf) Swelling    Tamsulosin Nausea/ Vomiting     HOSPITAL PROCEDURE(S):    No orders of the defined types were placed in this encounter.    Surgical/Procedural Cases on this Admission       Case IDs Date Procedure Surgeon Location Status    [id number] 11/05/22 LUMPECTOMY SEED LOCALIZATION AND SENTINEL LYMPH NODE MAPPING Docia Furl, MD [facility] [address]          REASON FOR HOSPITALIZATION AND HOSPITAL COURSE   BRIEF HPI & NARRATIVE: This is a 47 y.o. female who underwent right lumpectomy and axillary sentinel lymph node biopsy. She tolerated the procedure well and was sent to the PACU in stable condition. She was discharged home from PACU once she met discharge criteria.  TRANSITION/POST DISCHARGE CARE/PENDING TESTS/REFERRALS: 2 week follow-up with breast surgery    CONDITION ON DISCHARGE:  A. Ambulation: Full ambulation  B. Self-care Ability: Complete  C. Cognitive Status Alert and Oriented x 3  D. Code status at discharge:       LINES/DRAINS/WOUNDS AT DISCHARGE:   Patient Lines/Drains/Airways Status       Active Line / Dialysis Catheter / Dialysis Graft / Drain / Airway / Wound       Name Placement date Placement time Site Days    Peripheral IV Left;Proximal Basilic  (medial side of arm) 11/05/22  0610  -- less than 1    Implantable Port Right Chest 04/26/22  --  -- 193    Wound  Incision Medial;Right Clavicle 04/26/22  0807  -- 193    Wound  Incision Left;Medial Clavicle 04/26/22  0807  -- 193    Wound  Incision Lateral;Right Axilla 11/05/22  0731  -- less than 1    Wound  Incision Right Breast 11/05/22  0801  -- less than 1                    DISCHARGE DISPOSITION:  Home discharge  DISCHARGE INSTRUCTIONS:  Post-Discharge Follow Up Appointments       Tuesday Nov 05, 2022    Surgical Specimen-Not for patient visit. with BCC MM1 at 11:00 AM  Monday Nov 18, 2022    New Patient Visit with Anice Paganini, PT at 10:30 AM    Return Patient Visit with Docia Furl, MD at  [facility] PM      Friday Nov 22, 2022    Lab with Lab Chair 1, Lab Prn [facility] [place] at  9:15 AM    Return Patient  Visit with Lupita Dawn, MD at  9:30 AM    Return Patient Visit with Nurse, Tm Hem/Onc Prn [facility] [place] at  9:30 AM    Chemotherapy with CHAIR 20 - 417, PRN ONC INFUSION at 10:00 AM      Friday Dec 06, 2022    Chemotherapy with CHAIR 11 - 411, PRN ONC INFUSION at  9:00 AM      Friday Dec 27, 2022    Chemotherapy with CHAIR 11 - 411, PRN ONC INFUSION at  9:00 AM      Breast Clinic, [facility]  [facility], [facility]  [address] [facility]  [place] [address]  [phone] Hematology/Oncology, [facility], [place]  [address]  [place] [address]  [phone] Laboratory Services, [facility], [place]  [address]  [place] [address]  [phone]    Mammography, [facility]  [facility], [facility]  [address] [facility]  [place] [address]  [phone] Oncology Physical Therapy, [facility], [facility]  [address] [facility]  [place] [address]  [phone] [facility]  [facility], [place]   [address]  [place] [address]  [phone]             DISCHARGE INSTRUCTION - ACTIVITY - POST BREAST SURGERY    You will need someone to drive you home after surgery. You will need someone to stay with you for 24 hours after surgery.  You can drive 24 hours after surgery if you are not taking narcotic pain medication.  The day after your surgery you can do most of your normal activities of the day.  You should not do vigorous activity for one month that might cause your breast to move too much or stretch the incision.  Examples of acceptable activities are light housekeeping, walking, and stationary bicycling.  Examples of activities that are not recommended for one month are jogging, aerobics, horseback riding, heavy lifting, repetitive pushing/pulling with  your arm.  You may begin your arm exercises after your postoperative appointment (typically 2 weeks after surgery).     Activity: z - other (specify in comments)      DISCHARGE INSTRUCTION - ACTIVITY - POST SENTINAL LYMPH NODE OR AXILLARY DISSECTION    No lifting greater than 10 pounds or pushing/pulling with the affected arm.     Activity: z - other (specify in comments) No Lifting Greater Than 10 Pounds, No Pulling/Pushing With Affected Arm     DISCHARGE INSTRUCTION - AVOID USING THE AFFECTED UPPER EXTREMITY FOR BLOOD PRESSURES, BLOOD DRAWS, OR IV STICKS    Patient has a history of Right Breast Cancer with Sentinel Lymph Node Biopsy.     No blood pressures, blood draws, or IV sticks to the affected upper extremity: Right arm      DISCHARGE INSTRUCTION - WOUND/INCISION CARE BREAST SURGERY    You will have a surgical bra on that was placed at the end of the surgery.  This has been provided  to help minimize movement and swelling of the breast, which will help reduce pain.  You should wear this bra 24 hours/day (only remove to shower) for 2 weeks or until your postoperative appointment.  You may replace this bra with any compressive sports bra that clasps in the front.  The gauze that is present under the bra compressing your incisions should stay in place and be replaced when dirty with a dry washcloth for 2 weeks.  Two days after surgery you should remove the clear plastic dressing and white gauze that is over your incision. You should now be able to see your surgical incision which will have either several narrow white tapes (steri-strips) or purple glue overlying the incision.  Do not pick, peel or remove the white tapes or purple glue at any time.  They will fall off after several weeks.  You do not need any additional dressing over the incision.  You may shower (not   bathe) as normal starting the first or second day after your surgery.  Water may run over your incision.  Do not submerge your incision in water.   Keep the incision clean and dry.  You may pat your incision to dry, do not rub.  Do not use any powder or lotion on your breast or deodorant under your arm for 2 weeks.  If you had axillary (arm pit) surgery you may have had blue dye injected into the breast.  Your breast may appear blue and you may have blue/green urine for 24-48 hours, which will go away.  Your skin tone may appear blue/gray from the dye, which will also go away.     DISCHARGE INSTRUCTION - SIGNS AND SYMPTOMS OF INFECTION    SIGNS AND SYMPTOMS OF INFECTION    Please watch your incision/wound site for the following signs of potential infection:    Increased redness or warmth at the incision site.  Drainage from the wound that may be foul smelling, cloudy, yellow or green in color.  Bulging or increased swelling at the incision site.  A temperature of more than 101.5 degrees F by mouth for 2 readings taken 4 hours apart.  A sudden increase in pain at the wound that is not relieved with pain medication.     DISCHARGE INSTRUCTION - POST-SURGICAL PAIN    Post surgical pain is a complex response to tissue trauma during surgery that stimulates hypersensitivity of the nervous system. Post-operative pain can be felt after any surgical procedure. Post-operative pain increases the possibility of post-surgical complications, raises the cost of medical care, and most importantly, interferes with recovery and return to normal activities of daily living. Management of post-surgical pain is a basic patient right.    Recent studies on pain control are indicating that taking a non-steroidal anti-inflammatory drug such as ibuprofen (Advil, Motrin) together with acetaminophen (Tylenol) has more significant post-operative pain relief than taking either drug alone.  Also, the ibuprofen and acetaminophen combination has significantly more pain relief than narcotic medications such as codeine, hydrocodone (Vicodin, Norco, Lortab), and oxycodone (Percocet,  Percodan).    For mild to moderate discomfort following surgery, 400 mg of ibuprofen (2 tablets of Advil or Motrin) every 4 hours as needed or 600 mg of ibuprofen (3 tablets of Advil or Motrin) every 6 hours as needed is usually adequate.  If you are unable to take ibuprofen, then 500 - 1000 mg of acetaminophen (1 or 2 tablets of Extra Strength Tylenol) every 6 hours as needed can  be taken for mild to moderate discomfort.  If you are experiencing moderate to severe pain, we suggest that you take 600 mg of ibuprofen (3 tablets of Advil or Motrin) and 1000 mg of acetaminophen (2 tablets of Extra Strength Tylenol) at the same time every 6 hours as needed.  If this does not give you adequate pain relief, please contact us for advice.    Do Not Take Tylenol or acetaminophen if you are already taking a narcotic drug that contains acetaminophen          Maryjean Ka, MD    Copies sent to Care Team         Relationship Specialty Notifications Start End    [name], FNP-BC PCP - General NURSE PRACTITIONER All results, Admissions 05/22/21     Phone: [phone] Fax: [phone]         [address] [place] [address]            Referring providers can utilize [email or web address] to access their referred [facility] patient's information.

## 2022-11-05 NOTE — Discharge Instructions (Addendum)
SURGICAL DISCHARGE INSTRUCTIONS     Dr. Leticia Clas, MD  performed your LUMPECTOMY SEED LOCALIZATION AND SENTINEL LYMPH NODE MAPPING today at the [facility]:  Monday through Friday from 6 a.m. - 7 p.m.: [phone]  Between 7 p.m. - 6 a.m., weekends and holidays:  Call [facility] at [phone] or [phone].    PLEASE SEE WRITTEN HANDOUTS AS DISCUSSED BY YOUR NURSE:      SIGNS AND SYMPTOMS OF A WOUND / INCISION INFECTION   Be sure to watch for the following:  Increase in redness or red streaks near or around the wound or incision.  Increase in pain that is intense or severe and cannot be relieved by the pain medication that your doctor has given you.  Increase in swelling that cannot be relieved by elevation of a body part, or by applying ice, if permitted.  Increase in drainage, or if yellow / green in color and smells bad. This could be on a dressing or a cast.  Increase in fever for longer than 24 hours, or an increase that is higher than 101 degrees Fahrenheit (normal body temperature is 98 degrees Fahrenheit). The incision may feel warm to the touch.    **CALL YOUR DOCTOR IF ONE OR MORE OF THESE SIGNS / SYMPTOMS SHOULD OCCUR.    ANESTHESIA INFORMATION   ANESTHESIA -- ADULT PATIENTS:  You have received intravenous sedation / general anesthesia, and you may feel drowsy and light-headed for several hours. You may even experience some forgetfulness of the procedure. DO NOT DRIVE A MOTOR VEHICLE or perform any activity requiring complete alertness or coordination until you feel fully awake in about 24-48 hours. Do not drink alcoholic beverages for at least 24 hours. Do not stay alone, you must have a responsible adult available to be with you. You may also experience a dry mouth or nausea for 24 hours. This is a normal side effect and will disappear as the effects of the medication wear off.    REMEMBER   If you experience any difficulty breathing, chest pain,  bleeding that you feel is excessive, persistent nausea or vomiting or for any other concerns:  Call your physician Dr. Mel Almond at [phone] or [phone]. You may also ask to have the doctor on call paged. They are available to you 24 hours a day.    SPECIAL INSTRUCTIONS / COMMENTS   See handout.      FOLLOW-UP APPOINTMENTS   Please call patient services at [phone] or [phone] to schedule a date / time of return. They are open Monday - Friday from 7:30 am - 5:00 pm.

## 2022-11-05 NOTE — H&P (Signed)
[facility]       H&P UPDATE FORM                                                                                    Patient's Name: Rachel Mendoza 47 y.o. female  Date of Admission:  11/05/2022  Patient's Date of Birth: 03-13-1976    11/05/2022    STOP: IF H&P IS GREATER THAN 30 DAYS FROM SURGICAL DAY COMPLETE NEW H&P IS REQUIRED.     H & P updated the day of the procedure.  1.  H&P completed within 30 days of surgical procedure  and has been reviewed within 24 hours of the surgery, the patient has been examined, and no change has occured in the patients condition since the H&P was completed.       Change in medications: No     Patient's last menstrual period was No LMP recorded. Patient has had a hysterectomy..      Comments: Patient reports "passing a kidney stone for the past few weeks" denies fevers, chills, worsening pain, weakness, dysuria. States she has a history of passing stones and this is typical for her.     2.  Patient continues to be appropriate candidate for planned surgical procedure. YES      Maryjean Ka, MD      I saw and examined the patient.  I reviewed the resident's note.  I agree with the findings and plan of care as documented in the resident's note.  Any exceptions/additions are edited/noted.    Docia Furl, MD

## 2022-11-05 NOTE — Anesthesia Transfer of Care (Signed)
ANESTHESIA TRANSFER OF CARE   Rachel Mendoza is a 47 y.o. ,female, Weight: 114 kg (252 lb 3.3 oz)   had Procedure(s) with comments:  LUMPECTOMY SEED LOCALIZATION AND SENTINEL LYMPH NODE MAPPING - BCC Seed Loc done on 7/29  SLN Inj bedside @ 0645  FRZ  performed  11/05/22   Primary Service: Docia Furl, MD    Past Medical History:   Diagnosis Date   . Allergic rhinitis    . Anal fissure    . Asthma    . Bradycardia    . CPAP (continuous positive airway pressure) dependence    . Depression    . Dysrhythmias     tachy after anesthesia for back surgery leading to prolonged stay   . Esophageal reflux    . History of anesthesia complications     hx of slow to wake   . Hx of breast cancer    . Hypersomnia    . Hypertension    . Hypothyroidism    . Insomnia, unspecified    . Kidney stones 06/06/2021   . Nerve damage    . Obesity, unspecified    . Shortness of breath    . Sleep apnea    . Tachycardia, unspecified    . Thyroid disorder    . Wears glasses       Allergy History as of 11/05/22       TAMSULOSIN         Noted Status Severity Type Reaction    05/01/21 1704 Fisher, Zenon Mayo, [place] 08/02/20 Active Low  Nausea/ Vomiting              DEXAMETHASONE (PF)         Noted Status Severity Type Reaction    03/07/22 1333 Mosetta Pigeon, MA 03/07/22 Active Low  Swelling                  I completed my transfer of care / handoff to the receiving personnel during which we discussed:  Access, Airway, All key/critical aspects of case discussed, Analgesia, Antibiotics, Expectation of post procedure, Fluids/Product, Gave opportunity for questions and acknowledgement of understanding, Labs and PMHx      Post Location: PACU                        Additional Info:Patient transferred to PACU on 6L SM. Patent airway, respiration regular, VSS, and sedated. Report given to bedside RN, verbalized comfort with transfer of care.                                    Last OR Temp: Temperature: 36.2 C (97.2 F)  ABG:  POTASSIUM   Date Value Ref  Range Status   10/25/2022 3.2 (L) 3.5 - 5.1 mmol/L Final     CALCIUM   Date Value Ref Range Status   10/25/2022 9.6 8.6 - 10.3 mg/dL Final     Calculated P Axis   Date Value Ref Range Status   07/19/2022 66 degrees Final     Calculated R Axis   Date Value Ref Range Status   07/19/2022 37 degrees Final     Calculated T Axis   Date Value Ref Range Status   07/19/2022 6 degrees Final     Airway:* No LDAs found *  Blood pressure (!) 123/99, pulse 83, temperature 36.2 C (97.2 F), resp. rate (!) 11,  height 1.651 m (5\' 5" ), weight 114 kg (252 lb 3.3 oz), SpO2 97%.

## 2022-11-05 NOTE — OR Surgeon (Signed)
FULL OPERATIVE NOTE    Patient Name:  Rachel Mendoza  Patient MRN:  [id number]  Patient DOB:  11-Aug-1975  Date of Service:  11/05/2022    Preoperative Diagnosis:  RIGHT breast invasive ductal carcinoma, clinical T2N0, Stage IIA/IIB  Postoperative Diagnosis:  Same    Procedure:  (1) RIGHT radioactive (I-125) seed-localized partial mastectomy                       (2) RIGHT sentinel lymph node biopsy (deep)                       (3) Injection for SLN identification (technitium by Radiology, lymphazurin by myself)    Surgeon:  Tia Masker, MD  Asst.:  Glorioso Drone, MD; Ramonita Lab, PA  Anesthesia:  General  Complications:  None apparent  Estimated blood loss:  20cc  Fluids:  Per anesthesia  Frozen specimens:  4 sentinel nodes - 3 negative, one equivocal.  Permanent specimens:  RIGHT seed-localized partial mastectomy, RIGHT breast margins (superior, anterior, inferior, lateral, medial, deep), RIGHT axillary sentinel lymph nodes (4)    Findings:  The radioactive seed, clip and mass were contained within the specimen, confirmed by specimen mammogram.    Indication for the procedure:  The patient is a 47 y.o.-year-old female who presents for a RIGHT partial mastectomy and RIGHT sentinel lymph node biopsy.  She was diagnosed with RIGHT cT2N0 (ERPRHER2neg) invasive ductal carcinoma by core-needle biopsy.  After a thorough discussion of benefits, risks, alternatives and possible complications including bleeding, chronic pain, infection, wound healing problems, positive margins, positive axillary lymph nodes, need for additional surgery, nerve damage, arm weakness, lymphedema, cosmetic asymmetry and anesthetic complications the patient consented for surgery.       Description of procedure:  After informed consent was obtained, the patient was taken to the operating room and transferred to the operative table in the supine position.  The patient's arms were placed on padded foam on the arm boards and abducted in place to  less than 90 degrees.  After the attachment of the appropriate cardiopulmonary monitors, placement of venodyne boots, administration of IV antibiotics, and a surgical pause (including patient identification, confirmation of the surgical site, laterality, procedure, allergy review, and fire safety review) the patient was given general anesthesia.  Preoperatively, Tc-99 had been injected behind the RIGHT nipple areolar complex, 482 microcurries.  5cc of Lymphazurin blue was injected under the RIGHT nipple areolar complex followed by 5 minutes of breast massage.  The location of the seed in the RIGHT breast was confirmed with the neoprobe.  The RIGHT breast and axilla were prepped and draped in a sterile fashion.  A curvilinear incision was planned in the RIGHT breast over the area marked by the Radiologist and the area with the highest radioactivity, then incised with a 15 blade.  Dissection was carried down through the skin, subcutaneous fat and into the breast parenchyma.  The location of the seed was identified with the neoprobe.  Circumferential skin flaps were raised and the location of the seed was again identified and the specimen was partially removed.  The area of interest was probed with the neoprobe to confirm the seed was in the specimen.  The cavity created was probed and minimal background was noted.  The specimen was then removed with electrocautery and marked with a short stitch superior and a long stitch lateral and sent to the [facility] for specimen mammogram, then  to Pathology for permanent analysis.  The specimen, once completely out was probed and the one second count was 52841.  The cavity had one second counts of no greater than 50.  The cavity was irrigated.  Additional medial, lateral, superior, inferior, deep and anterior specimens were taken with a stitch marking the new true margin.  The cavity was again irrigated, hemostasis was achieved, the cavity was clipped and then the breast  parenchyma was brought together with a series of 3-0 vicryl sutures to reduce the volume of the cavity.  0.25% marcaine was injected into the cavity for post-operative pain control.  The incision was then closed in two layers with interrupted 3-0 vicryl sutures in the deep dermis followed by a running 4-0 subcuticular monocryl suture. Mastisol and steristrips and a sterile dressing were placed followed by dry bulky gauze.      The neoprobe was draped with a sterile sleeve and a hot spot was identified in the axilla.  An incision inferior to the hair-bearing area of the RIGHT axilla was marked and incised with a 15 blade.  Dissection was carried down through the skin, subcutaneous fat, clavipectoral fascia and into the axilla.  The axilla was entered by incising the deltopectoral fascia and the first sentinel lymph node was identified deep to that fascia.  Gentle dissection was then used to identify blue lymphatics leading to blue or radioactive lymph nodes.  A blue channel/radioactive signal was identified and followed underneath the pectoralis muscle.  Once the blue/radioactive lymph node was identified it was removed and ex vivo count was 0.  Reevaluation of the axilla showed another blue/radioactive lymph node which was removed in a similar fashion and the ex vivo count was 0.  Third node was hot and blue at 4741.  Last node was palpable only.  This was done until all sentinel lymph nodes were removed with a final axillary bed count of 25.  In total, 4 blue/radioactive/ palpable axillary lymph nodes from level 1 were removed and sent to Pathology for permanent analysis.  Once completed, the axilla was inspected for additional blue and/or radioactive nodes.  On exam there were no large, palpable lymph nodes present in the axilla.  The wound was irrigated and hemostasis was achieved.  The incision was then closed in two layers with interrupted 3-0 vicryl sutures in the deep dermis followed by a running 4-0  subcuticular monocryl suture.  Steristrips and a sterile dressing were placed followed by dry bulky gauze.  The patient was awakened from anesthesia, a surgical bra was applied and she was transferred to the recovery room in stable condition.  Sponge and needle counts were confirmed correct at case conclusion.  I was present and scrubbed throughout the procedure; there were no immediate post-operative complications.  The need for a return to the operating room in the near future is dependent on the final pathology available in 7-10 days.    Docia Furl, MD 11/05/2022, 09:04    Sentinel Node Biopsy  Element Response Options   Operation performed with curative intent Yes   Tracer(s) used to identify sentinel nodes in the upfront surgery (non-neoadjuvant)setting (select all that apply)  NA   Tracer(s) used to identify sentinel nodes in the neoadjuvant setting (select all that apply) Dye and Radioactive tracer   All nodes (colored or non-colored) present at the end of dye-filled lymphatic channel were removed. Yes   All significantly radioactive nodes were removed.  Yes   All palpably suspicious nodes were  removed.  Yes   Biopsy-proven positive nodes marked with clips prior to chemotherapy were identified and removed.  NA

## 2022-11-15 ENCOUNTER — Other Ambulatory Visit (INDEPENDENT_AMBULATORY_CARE_PROVIDER_SITE_OTHER): Payer: Self-pay | Admitting: HEMATOLOGY-ONCOLOGY

## 2022-11-15 ENCOUNTER — Ambulatory Visit (HOSPITAL_COMMUNITY): Payer: Commercial Managed Care - PPO

## 2022-11-18 ENCOUNTER — Other Ambulatory Visit: Payer: Self-pay

## 2022-11-18 ENCOUNTER — Encounter (HOSPITAL_BASED_OUTPATIENT_CLINIC_OR_DEPARTMENT_OTHER): Payer: Self-pay | Admitting: Surgery

## 2022-11-18 ENCOUNTER — Ambulatory Visit (INDEPENDENT_AMBULATORY_CARE_PROVIDER_SITE_OTHER): Payer: Commercial Managed Care - PPO

## 2022-11-18 ENCOUNTER — Ambulatory Visit: Payer: Commercial Managed Care - PPO | Attending: Surgery | Admitting: Surgery

## 2022-11-18 VITALS — BP 97/62 | HR 117 | Resp 20 | Ht 65.0 in | Wt 253.0 lb

## 2022-11-18 DIAGNOSIS — Z9189 Other specified personal risk factors, not elsewhere classified: Secondary | ICD-10-CM

## 2022-11-18 DIAGNOSIS — C50911 Malignant neoplasm of unspecified site of right female breast: Secondary | ICD-10-CM

## 2022-11-18 DIAGNOSIS — R531 Weakness: Secondary | ICD-10-CM

## 2022-11-18 DIAGNOSIS — R29898 Other symptoms and signs involving the musculoskeletal system: Secondary | ICD-10-CM

## 2022-11-18 DIAGNOSIS — N644 Mastodynia: Secondary | ICD-10-CM | POA: Insufficient documentation

## 2022-11-18 DIAGNOSIS — Z723 Lack of physical exercise: Secondary | ICD-10-CM

## 2022-11-18 DIAGNOSIS — R293 Abnormal posture: Secondary | ICD-10-CM

## 2022-11-18 DIAGNOSIS — G620 Drug-induced polyneuropathy: Secondary | ICD-10-CM

## 2022-11-18 DIAGNOSIS — N6459 Other signs and symptoms in breast: Secondary | ICD-10-CM

## 2022-11-18 DIAGNOSIS — Z483 Aftercare following surgery for neoplasm: Secondary | ICD-10-CM | POA: Insufficient documentation

## 2022-11-18 DIAGNOSIS — K5900 Constipation, unspecified: Secondary | ICD-10-CM | POA: Insufficient documentation

## 2022-11-18 DIAGNOSIS — Z171 Estrogen receptor negative status [ER-]: Secondary | ICD-10-CM

## 2022-11-18 DIAGNOSIS — Z9011 Acquired absence of right breast and nipple: Secondary | ICD-10-CM

## 2022-11-18 DIAGNOSIS — C50411 Malignant neoplasm of upper-outer quadrant of right female breast: Secondary | ICD-10-CM

## 2022-11-18 DIAGNOSIS — R5383 Other fatigue: Secondary | ICD-10-CM

## 2022-11-18 DIAGNOSIS — Z9889 Other specified postprocedural states: Secondary | ICD-10-CM

## 2022-11-18 DIAGNOSIS — R52 Pain, unspecified: Secondary | ICD-10-CM

## 2022-11-18 DIAGNOSIS — Z853 Personal history of malignant neoplasm of breast: Secondary | ICD-10-CM | POA: Insufficient documentation

## 2022-11-18 DIAGNOSIS — T451X5A Adverse effect of antineoplastic and immunosuppressive drugs, initial encounter: Secondary | ICD-10-CM

## 2022-11-18 NOTE — Progress Notes (Signed)
F/U after RIGHT mastectomy with SLNbx due to diagnosis of RIGHT breast invasive ductal carcinoma, cT2N0 performed on 11/05/2022.  She is doing well.  She is tolerating a regular diet.  She is not having any nausea, vomiting, diarrhea, fever or chills.  No redness or drainage from the incisions.    + constipation and pain in breast intermittently, axilla constant.  She has been using a pillow to help with compression of the axilla.    BP 97/62 (Patient Position: Sitting)   Pulse (!) 117   Resp 20   Ht 1.651 m (5\' 5" )   Wt 115 kg (253 lb)   SpO2 96%   BMI 42.10 kg/m     RIGHT breast and axillary incisions are c/d/i.  No erythema, no fluctuance, no drainage appreciated.  Steristrips remain in place and were easily removed in clinic today.  No bruising noted.  Appropriate amount of scar tissue appreciated under the surgical site.      Path report:  pending during visit.  Resulted the following day and called to the patient.  Final Diagnosis   A. Lymph Node, Right Axillary Sentinel Lymph Node #1, Excision:     - One lymph node negative for metastatic carcinoma (0/1).     B. Lymph Node, Right Axillary Sentinel Lymph Node #2, Excision:     - One lymph node negative for metastatic carcinoma (0/1).     C. Lymph Node, Right Axillary Sentinel Lymph Node #3, Excision:     - One lymph node negative for metastatic carcinoma (0/1).     D. Lymph Node, Right Axillary Sentinel Lymph Node #4, Excision:     - One lymph node negative for metastatic carcinoma (0/1).     E. Breast, Right, Lumpectomy:     - Breast tissue with therapy related/previous biopsy site changes (no residual carcinoma present), ypT0 snN0 (see synoptic report).     F. Breast, Right, Additional Superior Margin, Excision:     - Benign breast tissue.     G. Breast, Right, Additional Anterior Margin, Excision:     - Benign breast tissue.     H. Breast, Right, Additional Inferior Margin, Excision:     - Benign breast tissue.     I. Breast, Right, Additional  Lateral Margin, Excision:     - Benign breast tissue.     J. Breast, Right, Additional Medial Margin, Excision:     - Benign breast tissue.     K. Breast, Right, Additional Deep Margin, Excision:     - Benign breast tissue.     47 y.o. female s/p RIGHT lumpectomy with SLNbx for invasive ductal carcinoma  - Complete pathologic response.   - recovering as expected  - instructed on suggested physical limitations going forward  - RTC in 6 months for repeat clinical exam  - COC visit planned by phone in 4 weeks  - Counseled on self breast exams  - Continue to follow with medical oncology    Docia Furl, MD 11/18/2022, 13:16

## 2022-11-18 NOTE — Progress Notes (Signed)
[facility]  [address]  [place] [address]    Oncology Physical Therapy Evaluation    Name: Rachel Mendoza  Visit date: 11/18/2022    MRN: [id number]  Eval date: 11/18/22   POC Signed: n/a Re-Cert: n/a   Insurance: [employer] Visit number: 1     Total treatment time:   Evaluation  25 mins   Re-evaluation 97164    Manual therapy 97140    Therapeutic exercise 97110 23 mins   Therapeutic activities 97530    Neuromuscular re-education 97112 7 mins   Gait training 97116    PBM        Chief Complaint: post op breast cancer  Reason for Referral: Malignant neoplasm of upper-outer quadrant of right breast in female, estrogen receptor negative  Referring Physician: Docia Furl, MD     History of Present Illness or Injury: Rachel Mendoza reports to oncology PT with her husband Rachel Mendoza. She reports she is sore following surgery and sentinel lymph node biopsy is the most painful. Rachel Mendoza reports following low back surgery and pain she is unable to work and gets all of her groceries delivered to the home.  Reports since chemotherapy she has noticed chemo induced peripheral neuropathy and worsening right low back pain.  Response to Treatment: better understanding of lymphedema,   HEP Compliance: to be completed once a day    Cancer history per chart review:    Chief Complaint: newly diagnosed      Rachel Mendoza is a 47 y.o. White female who presents as a new patient to clinic today for evaluation of newly diagnosed breast cancer. Per review of her outside records and px report,  patient initially palpated mass in her right breast in the beginning of December 2023, she subsequently underwent diagnostic mammogram on 12/6 that showed 2 x 2.2 x 2.1 cm oval mass in the right breast with no microcalcifications, skin thickening, or retraction. 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 She initially met  with a surgeon and planned to undergo breast conserving therapy with radiation, but started neoadjuvant chemotherapy due to the Ki67. CT imaging on 06/03/22 showed no evidence of metastatic disease. She underwent 4 cycles of paclitaxel, carboplatin, and keytruda and then 9 cycles of adriamycin, cytoxan and keytruda, finishing her last cycle on 6/28. She underwent genetic testing that showed no clinically significant variants. Her ultrasound today showed a 1.1 cm residual mass with normal axillary lymph nodes. She reports today that she is interested in a lumpectomy with radiation. She is scheduled to see Dr. Rich Reining on 7/15 to discuss reconstruction options. She admits/ denies tenderness or pain.  Denies the presence of nipple discharge, change in appearance of the breast, hx of trauma to the breast, axillary adenopathy or a previous Hx of cyst aspirations or breast biopsies.     Resulted the following day and called to the patient.  Final Diagnosis   A. Lymph Node, Right Axillary Sentinel Lymph Node #1, Excision:     - One lymph node negative for metastatic carcinoma (0/1).     B. Lymph Node, Right Axillary Sentinel Lymph Node #2, Excision:     - One lymph node negative for metastatic carcinoma (0/1).     C. Lymph Node, Right Axillary Sentinel Lymph Node #3, Excision:     - One lymph node negative for metastatic carcinoma (0/1).     D. Lymph Node, Right Axillary Sentinel  Lymph Node #4, Excision:     - One lymph node negative for metastatic carcinoma (0/1).     E. Breast, Right, Lumpectomy:     - Breast tissue with therapy related/previous biopsy site changes (no residual carcinoma present), ypT0 snN0 (see synoptic report).     F. Breast, Right, Additional Superior Margin, Excision:     - Benign breast tissue.     G. Breast, Right, Additional Anterior Margin, Excision:     - Benign breast tissue.     H. Breast, Right, Additional Inferior Margin, Excision:     - Benign breast tissue.     I. Breast, Right, Additional  Lateral Margin, Excision:     - Benign breast tissue.     J. Breast, Right, Additional Medial Margin, Excision:     - Benign breast tissue.     K. Breast, Right, Additional Deep Margin, Excision:     - Benign breast tissue.     Current Outpatient Medications   Medication Sig    albuterol sulfate (PROVENTIL OR VENTOLIN OR PROAIR) 90 mcg/actuation Inhalation oral inhaler Take 1-2 Puffs by inhalation Every 6 hours as needed    beclomethasone dipropionate (QVAR REDIHALER) 80 mcg/actuation Inhalation oral inhaler Take 1 Puff by inhalation Twice daily    dicyclomine (BENTYL) 20 mg Oral Tablet Take 1 Tablet (20 mg total) by mouth Four times a day    dilTIAZem (CARDIZEM CD) 120 mg Oral Capsule, Sust. Release 24 hr Take 1 Capsule (120 mg total) by mouth Once a day    gabapentin (NEURONTIN) 300 mg Oral Capsule Take 1 Capsule (300 mg total) by mouth Four times a day    hydroCHLOROthiazide (HYDRODIURIL) 25 mg Oral Tablet Take 1 Tablet (25 mg total) by mouth Once a day    levothyroxine (SYNTHROID) 175 mcg Oral Tablet Take 1 Tablet (175 mcg total) by mouth Every morning    LIDOCAINE VISCOUS 2 % Mucous Membrane Solution Once per day as needed    lidocaine-prilocaine (EMLA) 2.5-2.5 % Cream APPLY TOPICALLY AS A ONE-TIME DOSE    loratadine (CLARITIN) 10 mg Oral Tablet Take 1 Tablet (10 mg total) by mouth Once a day    losartan (COZAAR) 50 mg Oral Tablet Take 1 Tablet (50 mg total) by mouth Once a day    Magic Mouthwash Swish and spit 10 mL Every 2 hours as needed for Sore throat Contains lidocaine viscous 2%, diphenhydramine 12.5 mg/5 ml, Maalox. Mix 1:1:1    magnesium Oxide 420 mg Oral Tablet Take 486 mg by mouth Twice daily    montelukast (SINGULAIR) 10 mg Oral Tablet Take 1 Tablet (10 mg total) by mouth Once a day    nystatin (MYCOSTATIN) 100,000 unit/mL Oral Suspension Take 5 mL by mouth Four times a day    omeprazole (PRILOSEC) 20 mg Oral Capsule, Delayed Release(E.C.) Take 1 Capsule (20 mg total) by mouth Once a day     potassium chloride (K-DUR) 20 mEq Oral Tab Sust.Rel. Particle/Crystal Take 1 Tablet (20 mEq total) by mouth Once a day (Patient taking differently: Take 1 Tablet (20 mEq total) by mouth Once a day Has not been taking)    promethazine (PHENERGAN) 25 mg Oral Tablet TAKE 1 TABLET BY MOUTH EVERY 6 HOURS AS NEEDED FOR NAUSEA AND VOMITING    sertraline (ZOLOFT) 100 mg Oral Tablet Take 1 Tablet (100 mg total) by mouth Once a day     Past Medical History:   Diagnosis Date    Allergic rhinitis  Anal fissure     Asthma     Bradycardia     CPAP (continuous positive airway pressure) dependence     Depression     Dysrhythmias     tachy after anesthesia for back surgery leading to prolonged stay    Esophageal reflux     History of anesthesia complications     hx of slow to wake    Hx of breast cancer     Hypersomnia     Hypertension     Hypothyroidism     Insomnia, unspecified     Kidney stones 06/06/2021    Nerve damage     Obesity, unspecified     Shortness of breath     Sleep apnea     Tachycardia, unspecified     Thyroid disorder     Wears glasses      Past Surgical History:   Procedure Laterality Date    HX BACK SURGERY      HX BREAST BIOPSY Right     HX HAND SURGERY Left     left thumb    HX HYSTERECTOMY      HX LITHOTRIPSY      HX TONSILLECTOMY      HX TUBAL LIGATION Bilateral     PORTACATH PLACEMENT       Social History:  Home set up: stairs - reports fatigue from chemotherapy (she reports she does not use stairs)  Support: husband (physical take care of her as need)  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[employer])   DME: none  Hobbies: TV, playing with 60 year old grandson  Exercise: nothing - has all items delivered, does not visit kids or grandchildren  Goals for PT: reduce pain, increase activity    EXAMINATION FINDINGS    Communication, Affect, Orientation, Cognition and Learning  Communication: able to communicate all needs without issue  Orientation (person/place/time/situation): O x  3  Emotional/behavorial response: Normal emotional response to given situation   Preferred learning style: verbal  Learning barriers: none  Safety awareness: none  Education needs: general exercise guidelines, pain management, upper extremity range of motion, lymphedema education     System Review  Cardiovascular/Pulmonary  HR (bpm): 117  O2 saturation: 96 %    Integumentary System:   Scars Right lumpectomy  SLNB  Steri strips intact  Seeing surgeon post PT appointment   Edema none   Ecchymosis none   Erythema none   Integrity      Integumentary  Circumferential Measurements  Did not obtain baseline  Arm at risk: right  Dominant arm: right (0/4 LN removed)    In cm RIGHT Post op 11/18/22     Palmer crease 20.5     0 18.5     5 20.5     10 24     15  27.5     20 30     25  33     30 39     35 40     40 44         In cm LEFT 11/18/22     Palmer crease 20     0 18.5     5 20.5     10 23.5     15 27.7     20 29.4     25 30     30  39.5     35 42     40 45       Patient denied  signs or symptoms of lymphedema in right arm: (no swelling, heaviness, tightness, tingling, skin changes)  - no visible or palpable edema in bilateral arms        Neuromuscular:   Sensation:   -  reports diminished sensation over right breast  - denies numbness in posterior right arm  - reports since chemotherapy reports numbness or tingling in hands or feet (right foot worse)  Balance and Gait:  Normal static and dynamic sitting balance. 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) and stepped on her dog  Pain:     Present: 2 Best:  Worst:      Location Right breast   Characteristics sore   Activity that cause exacerbation/relief    Functional limitations ADLs  IADLs  Recreation        Musculoskeletal:     ROM  Bilateral Shoulder:  See below   Bilateral Elbow: WFL  Pain Free   Bilateral Wrist: WFL  Pain Free   Bilateral Hand: WFL  Pain Free    Bilateral Hip: WFL  Pain Free   Bilateral Knee: WFL  Pain Free   Bilateral Ankle: WFL  Pain Free     Joint GH        Right Left    Flexion 145 175    Extension      Abduction 120 175    Adduction      Internal Rotation      External Rotation       Manual Muscle Testing  - weight restriction post op    Functional mobility:   Transfers: PLOF: independent      CLOF: independent    Gait:  PLOF: independent      CLOF: independent    Functional Test Scores:   QuickDASH   Post op 11/18/22      Observation/Posture:  Mild rounding of bilateral shoulders, mild forward head     Skilled Services Provided:     Therapeutic Exercise:   Walking program: Discussed with patient the benefits of walking including cardiovascular health, support lymphatic system, pain control, bone health, endurance, overall health etc.  Recommended following surgery patient walk 1-2 mins in their home multiple times throughout the day and slowly increase.     Phase 1 Breast Surgery Mobility Program [facility]):  To be started postop day 1  Modified "W" 10 x 2 (BID)  Instructions:  Only to be performed pain-free, if painful reduced strength of muscle contraction or DC To address/prevent postural dysfunction     Counter top stretch in flexion/abduction 10 x 2 (BID)  Instructions:  Step backwards until very slight stretch or edge of discomfort and no pain PROM of affected shoulder(s) to prevent loss of range of motion postsurgically     Bicep curl 10 x 2 (SID to BID) Maintain strength and to avoid acquiring postsurgical weakness   Posterior shoulder circles 10 x 2  Instruction:  Perform within available range multiple times a day as able Activation of lymphatic system to decrease postoperative swelling   Wrist circles 10 x 2  Instruction:  Perform within available range multiple times a day as able Activation of lymphatic system to decrease postoperative swelling     - lymphedema education gave handout  - discussed benefits of exercise in treating  cancer-related fatigue  -     Manual Therapy:  not performed  Therapeutic  Activities:  Not performed  Gait Training:  Not performed  Neuromuscular re-education:   - Verbal and tactile cues for diaphragmatic breathing.  Discussed benefits of diaphragmatic breathing including activation of lymphatic system, reduction in sympathetic nervous system, pain reduction. Belly or Diaphragmatic breathing:  - demonstrated, verbal and tactile cues, and gave patient link to youtube video   - towel scrunches seated start with and work up to x5 minutes a day  - grip strengthening either with therapy putty or stress ball start with 1 minute and work up to 5 minutes a day    PT EVALUATION    Problem List/Impairments:  Impaired right shoulder range of motion  Right shoulder weakness  Impaired endurance following cancer treatment  At risk for lymphedema  Postural dysfunction  Chemo induced peripheral neuropathy  Chronic low back pain  Functional Limitations:   ADLs (self dressing and hygiene, sleeping)  IADLs (home chores, cleaning)  Recreation (exercise, visiting with friends and family)  Work - unable to since low back surgery in pain    Disability: per quickDASH moderate 65.9    Clinical Impression:  Rachel Mendoza is a 47 year old female who presents to oncology physical therapy for postoperative exercises and lymphedema education. She also has a history of asthma, bradycardia, utilizes a CPAP, depression, dysarthria, gastroesophageal reflux disease, hypertension, hypothyroidism, insomnia, obesity, history of shortness of breath, tachycardia.  She underwent right lumpectomy and sentinel lymph node biopsy 0/4 on 11/05/2022 to treat right invasive ductal carcinoma.  They present with the above-listed impairments and limitations as a sequela of their cancer. She demonstrated proper form and good understanding of all exercises after verbal and tactile cues.  Their past Medical history as well as her ongoing cancer treatment plan  radiation therapy may complicate her recovery and she may require additional time to progress through her POC.  She will benefit from skilled PT to regain her independence with ADLs/IADLs, recreation, and work, restore her PLOF, decrease pain, improve her functional mobility, and improve her quality of life.  Rachel Mendoza and her husband were given adequate time to ask all questions and express all concerns.  She reported being satisfied with the answers provided and is agreeable to POC as listed below    PT recommended patient discuss CIPN with oncologist   PT recommended patient discuss with her physician (Dr. Fredric Mare or PCP) about local PT for generalized deconditioning, weakness, fatigue, CIPN, history of a fall.  Patient states she will be getting more exercise walking to radiation everyday and does not wish to participate in physical therapy at this time    Prognosis: good to fair due to limited physical activity, history of chronic pain, multiple comorbidities, good response to today's treatment    Plan:  Patient will return to physical therapy when she has radiation therapy schedule.  Within 1-3 months    PLAN OF CARE   Treatment intensity: 1x a month  Duration: 6 months    Interventions  Therapeutic Exercise:  Scapular and shoulder strengthening, stretching of the pectoralis muscles, thoracic and lumbar spine stabilization    Neuromuscular Re-education:  Diaphragmatic breathing, scapular muscle activation  Therapeutic Activities:  ADLs and IADL modification, functional exercises to improve ability to return to everyday activities  Manual Therapy:  Soft tissue mobilization including trigger point release, myofascial release, manual lymphatic drainage, and scar mobilization  Endurance Training:  Walking program, general exercise guidelines    PBM: LED and trigger point laser    Goals:  Increase right shoulder flex  to 180 degrees to improve ability to perform self dressing  Decrease QuickDASH score to 10 to  improve ability to return to recreation  Be able to recall 3 signs and symptoms of lymphedema improve early detection   Increase right upper extremity strength to globally 4+/5 to improve ability to return to recreation    Estimated total visit range from 6. Visit mays changed based on patients ongoing cancer treatment

## 2022-11-19 ENCOUNTER — Encounter (HOSPITAL_COMMUNITY): Payer: Self-pay | Admitting: HEMATOLOGY-ONCOLOGY

## 2022-11-19 ENCOUNTER — Encounter (HOSPITAL_BASED_OUTPATIENT_CLINIC_OR_DEPARTMENT_OTHER): Payer: Self-pay | Admitting: Surgery

## 2022-11-19 DIAGNOSIS — Z171 Estrogen receptor negative status [ER-]: Secondary | ICD-10-CM

## 2022-11-19 LAB — SURGICAL PATHOLOGY SPECIMEN: REGIONAL LYMPH NODE STATUS: NEGATIVE

## 2022-11-19 NOTE — Nursing Note (Signed)
Patient seen in [facility] to receive comfort items. Patient received a wig, wig linger and wood wig stand. She was assisted by Bernita Raisin.  Jaynie Collins, RN

## 2022-11-21 ENCOUNTER — Encounter (INDEPENDENT_AMBULATORY_CARE_PROVIDER_SITE_OTHER): Payer: Self-pay | Admitting: HEMATOLOGY-ONCOLOGY

## 2022-11-22 ENCOUNTER — Ambulatory Visit
Admission: RE | Admit: 2022-11-22 | Discharge: 2022-11-22 | Disposition: A | Discharge status: Home / Self Care | Payer: Commercial Managed Care - PPO | Source: Ambulatory Visit | Attending: HEMATOLOGY-ONCOLOGY | Admitting: HEMATOLOGY-ONCOLOGY

## 2022-11-22 ENCOUNTER — Ambulatory Visit (HOSPITAL_BASED_OUTPATIENT_CLINIC_OR_DEPARTMENT_OTHER): Payer: Commercial Managed Care - PPO | Admitting: HEMATOLOGY-ONCOLOGY

## 2022-11-22 ENCOUNTER — Encounter (HOSPITAL_COMMUNITY): Payer: Self-pay | Admitting: HEMATOLOGY-ONCOLOGY

## 2022-11-22 ENCOUNTER — Inpatient Hospital Stay (INDEPENDENT_AMBULATORY_CARE_PROVIDER_SITE_OTHER)
Admission: RE | Admit: 2022-11-22 | Discharge: 2022-11-22 | Disposition: A | Discharge status: Home / Self Care | Payer: Commercial Managed Care - PPO | Source: Ambulatory Visit | Attending: HEMATOLOGY-ONCOLOGY | Admitting: HEMATOLOGY-ONCOLOGY

## 2022-11-22 ENCOUNTER — Encounter (INDEPENDENT_AMBULATORY_CARE_PROVIDER_SITE_OTHER): Payer: Self-pay | Admitting: HEMATOLOGY-ONCOLOGY

## 2022-11-22 ENCOUNTER — Encounter (HOSPITAL_COMMUNITY): Payer: Self-pay

## 2022-11-22 ENCOUNTER — Other Ambulatory Visit: Payer: Self-pay

## 2022-11-22 VITALS — BP 135/96 | HR 110 | Temp 97.4°F | Ht 65.0 in | Wt 252.7 lb

## 2022-11-22 VITALS — BP 97/54 | HR 103 | Temp 98.3°F | Resp 18

## 2022-11-22 DIAGNOSIS — Z5112 Encounter for antineoplastic immunotherapy: Secondary | ICD-10-CM | POA: Insufficient documentation

## 2022-11-22 DIAGNOSIS — Z171 Estrogen receptor negative status [ER-]: Secondary | ICD-10-CM

## 2022-11-22 DIAGNOSIS — N200 Calculus of kidney: Secondary | ICD-10-CM

## 2022-11-22 DIAGNOSIS — C50411 Malignant neoplasm of upper-outer quadrant of right female breast: Secondary | ICD-10-CM

## 2022-11-22 DIAGNOSIS — Z79620 Long term (current) use of immunosuppressive biologic: Secondary | ICD-10-CM | POA: Insufficient documentation

## 2022-11-22 DIAGNOSIS — Z9221 Personal history of antineoplastic chemotherapy: Secondary | ICD-10-CM | POA: Insufficient documentation

## 2022-11-22 DIAGNOSIS — F17210 Nicotine dependence, cigarettes, uncomplicated: Secondary | ICD-10-CM

## 2022-11-22 DIAGNOSIS — Z803 Family history of malignant neoplasm of breast: Secondary | ICD-10-CM

## 2022-11-22 LAB — CBC WITH DIFF
BASOPHIL #: 0.1 10*3/uL (ref 0.00–0.10)
BASOPHIL %: 1 % (ref 0–1)
EOSINOPHIL #: 0.8 10*3/uL — ABNORMAL HIGH (ref 0.00–0.50)
EOSINOPHIL %: 7 % (ref 1–7)
HCT: 36.7 % (ref 31.2–41.9)
HGB: 12.3 g/dL (ref 10.9–14.3)
LYMPHOCYTE #: 2.1 10*3/uL (ref 1.00–3.00)
LYMPHOCYTE %: 18 % (ref 16–44)
MCH: 33.2 pg — ABNORMAL HIGH (ref 24.7–32.8)
MCHC: 33.6 g/dL (ref 32.3–35.6)
MCV: 98.7 fL — ABNORMAL HIGH (ref 75.5–95.3)
MONOCYTE #: 1 10*3/uL (ref 0.30–1.00)
MONOCYTE %: 8 % (ref 5–13)
MPV: 8 fL (ref 7.9–10.8)
NEUTROPHIL #: 7.6 10*3/uL (ref 1.85–7.80)
NEUTROPHIL %: 65 % (ref 43–77)
PLATELETS: 291 10*3/uL (ref 140–440)
RBC: 3.72 10*6/uL (ref 3.63–4.92)
RDW: 14 % (ref 12.3–17.7)
WBC: 11.7 10*3/uL (ref 3.8–11.8)

## 2022-11-22 LAB — MAGNESIUM: MAGNESIUM: 1.8 mg/dL — ABNORMAL LOW (ref 1.9–2.7)

## 2022-11-22 LAB — THYROID STIMULATING HORMONE WITH FREE T4 REFLEX: TSH: 0.242 u[IU]/mL — ABNORMAL LOW (ref 0.450–5.330)

## 2022-11-22 LAB — COMPREHENSIVE METABOLIC PANEL, NON-FASTING
ALBUMIN/GLOBULIN RATIO: 1.3 (ref 0.8–1.4)
ALBUMIN: 4.1 g/dL (ref 3.5–5.7)
ALKALINE PHOSPHATASE: 99 U/L (ref 34–104)
ALT (SGPT): 20 U/L (ref 7–52)
ANION GAP: 7 mmol/L (ref 4–13)
AST (SGOT): 20 U/L (ref 13–39)
BILIRUBIN TOTAL: 0.3 mg/dL (ref 0.3–1.0)
BUN/CREA RATIO: 18 (ref 6–22)
BUN: 19 mg/dL (ref 7–25)
CALCIUM, CORRECTED: 9.7 mg/dL (ref 8.9–10.8)
CALCIUM: 9.8 mg/dL (ref 8.6–10.3)
CHLORIDE: 100 mmol/L (ref 98–107)
CO2 TOTAL: 30 mmol/L (ref 21–31)
CREATININE: 1.03 mg/dL (ref 0.60–1.30)
ESTIMATED GFR: 67 mL/min/{1.73_m2} (ref >59)
GLOBULIN: 3.1 (ref 2.9–5.4)
GLUCOSE: 119 mg/dL — ABNORMAL HIGH (ref 74–109)
OSMOLALITY, CALCULATED: 277 mOsm/kg (ref 270–290)
POTASSIUM: 3.6 mmol/L (ref 3.5–5.1)
PROTEIN TOTAL: 7.2 g/dL (ref 6.4–8.9)
SODIUM: 137 mmol/L (ref 136–145)

## 2022-11-22 LAB — THYROXINE, FREE (FREE T4): THYROXINE (T4), FREE: 0.96 ng/dL (ref 0.58–1.64)

## 2022-11-22 MED ORDER — DEXTROSE 5% IN WATER (D5W) FLUSH BAG - 250 ML
INTRAVENOUS | Status: DC | PRN
Start: 2022-11-22 — End: 2022-11-23

## 2022-11-22 MED ORDER — MEPERIDINE (PF) 25 MG/ML INJECTION SOLUTION
12.5000 mg | Freq: Once | INTRAMUSCULAR | Status: DC | PRN
Start: 2022-11-22 — End: 2022-11-23

## 2022-11-22 MED ORDER — SODIUM CHLORIDE 0.9% FLUSH BAG - 250 ML
INTRAVENOUS | Status: DC | PRN
Start: 2022-11-22 — End: 2022-11-23

## 2022-11-22 MED ORDER — METHYLPREDNISOLONE 4 MG TABLETS IN A DOSE PACK
ORAL_TABLET | ORAL | 0 refills | Status: AC
Start: 2022-11-22 — End: ?

## 2022-11-22 MED ORDER — HYDROCODONE 10 MG-ACETAMINOPHEN 325 MG TABLET
1.0000 | ORAL_TABLET | Freq: Four times a day (QID) | ORAL | 0 refills | Status: AC | PRN
Start: 2022-11-22 — End: ?

## 2022-11-22 MED ORDER — ALBUTEROL SULFATE HFA 90 MCG/ACTUATION AEROSOL INHALER - RN
2.0000 | Freq: Once | RESPIRATORY_TRACT | Status: DC | PRN
Start: 2022-11-22 — End: 2022-11-23

## 2022-11-22 MED ORDER — HYDROCORTISONE SOD SUCCINATE 100 MG/2 ML VIAL WRAPPER
100.0000 mg | Freq: Once | INTRAMUSCULAR | Status: DC | PRN
Start: 2022-11-22 — End: 2022-11-23

## 2022-11-22 MED ORDER — ALBUTEROL SULFATE 2.5 MG/3 ML (0.083 %) SOLUTION FOR NEBULIZATION
2.5000 mg | INHALATION_SOLUTION | Freq: Once | RESPIRATORY_TRACT | Status: DC | PRN
Start: 2022-11-22 — End: 2022-11-23

## 2022-11-22 MED ORDER — DIPHENHYDRAMINE 50 MG/ML INJECTION SOLUTION
25.0000 mg | Freq: Once | INTRAMUSCULAR | Status: DC | PRN
Start: 2022-11-22 — End: 2022-11-23

## 2022-11-22 MED ORDER — DIPHENHYDRAMINE 50 MG/ML INJECTION SOLUTION
50.0000 mg | Freq: Once | INTRAMUSCULAR | Status: DC | PRN
Start: 2022-11-22 — End: 2022-11-23

## 2022-11-22 MED ORDER — PEMBROLIZUMAB 25 MG/ML INTRAVENOUS SOLUTION
200.0000 mg | Freq: Once | INTRAVENOUS | Status: AC
Start: 2022-11-22 — End: 2022-11-22
  Administered 2022-11-22: 0 mg via INTRAVENOUS
  Administered 2022-11-22: 200 mg via INTRAVENOUS
  Filled 2022-11-22: qty 8

## 2022-11-22 MED ORDER — EPINEPHRINE 1 MG/ML (1 ML) INJECTION SOLUTION
0.3000 mg | Freq: Once | INTRAMUSCULAR | Status: DC | PRN
Start: 2022-11-22 — End: 2022-11-23

## 2022-11-22 MED ORDER — FAMOTIDINE (PF) 20 MG/2 ML INTRAVENOUS SOLUTION
20.0000 mg | Freq: Once | INTRAVENOUS | Status: DC | PRN
Start: 2022-11-22 — End: 2022-11-23

## 2022-11-22 MED ORDER — HYDROMORPHONE 2 MG/ML INJECTION WRAPPER
2.0000 mg | INJECTION | INTRAMUSCULAR | Status: AC
Start: 2022-11-22 — End: 2022-11-22
  Administered 2022-11-22: 2 mg via INTRAVENOUS
  Filled 2022-11-22: qty 1

## 2022-11-22 NOTE — Progress Notes (Signed)
Department of Hematology/Oncology  History and Physical    Name: Rachel Mendoza  YHC:[id number]  Date of Birth: [birth year]  Encounter Date: 11/22/2022    REFERRING PROVIDER:  No referring provider defined for this encounter.    TELEMEDICINE DOCUMENTATION:  Patient Location:  [facility], [facility] outpatient Hematology/Oncology [address] [place] [address]  Patient/family aware of provider location:  yes  Patient/family consent for telemedicine:  yes    REASON FOR OFFICE VISIT:  New patient for evaluation and management of triple negative breast cancer.    HISTORY OF PRESENT ILLNESS:  Rachel Mendoza is a 47 y.o. female who presents today for initial medical oncology consultation regarding triple negative breast cancer.  The patient discovered a lump which was evaluated with mammogram, ultrasound, and biopsy.  The primary lesion measures in the range of 2.2-2.9 cm, and there were some nearby lymph nodes that were described as prominent although not obviously involved.  The narrow measurement for the lymph nodes was in the range of 1.1 cm.    The biopsy showed invasive ductal malignancy that was triple negative.  BRCA testing is currently pending.  Based on the above information, she was referred for consideration of neoadjuvant chemotherapy.    05/10/2022: The patient is here for follow up of triple negative breast cancer.  We still do not have the BRCA result.  We have also been trying to obtain a pretreatment PET-CT, but have not been able to obtain approval for it yet.    05/31/2022: The patient is here for follow up of triple negative breast cancer.  She states that she has noticed a dramatic improvement in the size of the lesion.  She states that she struggles to palpate it now.    06/21/2022: The patient is here for follow up of triple negative breast cancer.  She states that she is having fatigue and various other issues related to treatment.    09/13/2022: The patient is here for  follow up of triple negative breast cancer. She states that she feels bad for approximately 10 days after chemotherapy treatments.     10/04/2022: The patient is here for follow up of triple negative breast cancer.  She states that she has a problem with dizziness when she stands up too quickly.  Otherwise, she is doing about the same.    10/25/2022: The patient is here for follow up of triple negative breast cancer.  Since the last visit, she did meet with a breast surgeon and the plan is to proceed with a lumpectomy at the end of the month.    11/22/2022:  The patient is here for follow up of triple negative breast cancer.  Since the last visit, she had surgery and was found to have a pathologic complete response.  She is doing well from the postsurgical standpoint, but states that she is in the process of trying to pass a kidney stone    ROS:   Review of Systems   Constitutional:  Negative for appetite change, chills and fatigue.   HENT:   Negative for sore throat and trouble swallowing.    Eyes:  Negative for eye problems.   Respiratory:  Negative for cough and shortness of breath.    Cardiovascular:  Negative for chest pain and leg swelling.   Gastrointestinal:  Negative for abdominal pain.   Genitourinary:  Negative for dysuria and hematuria.    Musculoskeletal:  Negative for arthralgias and gait problem.   Skin:  Negative  for rash.   Neurological:  Negative for gait problem.   Hematological:  Negative for adenopathy.   Psychiatric/Behavioral:  Negative for depression.         HISTORY:  Past Medical History:   Diagnosis Date    Allergic rhinitis     Anal fissure     Asthma     Bradycardia     CPAP (continuous positive airway pressure) dependence     Depression     Dysrhythmias     tachy after anesthesia for back surgery leading to prolonged stay    Esophageal reflux     History of anesthesia complications     hx of slow to wake    Hx of breast cancer     Hypersomnia     Hypertension     Hypothyroidism      Insomnia, unspecified     Kidney stones 06/06/2021    Nerve damage     Obesity, unspecified     Shortness of breath     Sleep apnea     Tachycardia, unspecified     Thyroid disorder     Wears glasses          Past Surgical History:   Procedure Laterality Date    HX BACK SURGERY      HX BREAST BIOPSY Right     HX HAND SURGERY Left     left thumb    HX HYSTERECTOMY      HX LITHOTRIPSY      HX TONSILLECTOMY      HX TUBAL LIGATION Bilateral     PORTACATH PLACEMENT           Social History     Socioeconomic History    Marital status: Married     Spouse name: Not on file    Number of children: Not on file    Years of education: Not on file    Highest education level: Not on file   Occupational History    Not on file   Tobacco Use    Smoking status: Every Day     Current packs/day: 0.50     Average packs/day: 0.5 packs/day for 36.6 years (18.3 ttl pk-yrs)     Types: Cigarettes     Start date: 04/08/1986     Passive exposure: Never    Smokeless tobacco: Never    Tobacco comments:     1800 quit now   Vaping Use    Vaping status: Never Used   Substance and Sexual Activity    Alcohol use: Not Currently     Comment: occasionally    Drug use: Yes     Types: Marijuana     Comment: CBD Gummies    Sexual activity: Not Currently   Other Topics Concern    Ability to Walk 1 Flight of Steps without SOB/CP No    Routine Exercise No    Ability to Walk 2 Flight of Steps without SOB/CP Not Asked    Unable to Ambulate Not Asked    Total Care Not Asked    Ability To Do Own ADL's Not Asked    Uses Walker No    Other Activity Level No     Comment: chemo has made her very weak, almost no activity    Uses Cane No   Social History Narrative    Not on file     Social Determinants of Health     Financial Resource Strain: Not on file   Transportation  Needs: Not on file   Social Connections: Not on file   Intimate Partner Violence: Not on file   Housing Stability: Not on file     Family Medical History:       Problem Relation (Age of Onset)    Asthma  Brother, Maternal Grandmother    Breast Cancer Paternal Grandmother    Diabetes type II Father, Paternal Grandmother    Elevated Lipids Father    Heart Disease Father    Hypertension (High Blood Pressure) Mother, Father    No Known Problems Sister, Maternal Aunt, Maternal Uncle, Paternal Aunt, Paternal Uncle, Maternal Grandfather, Paternal Grandfather, Daughter, Son, Other            Current Outpatient Medications   Medication Sig    albuterol sulfate (PROVENTIL OR VENTOLIN OR PROAIR) 90 mcg/actuation Inhalation oral inhaler Take 1-2 Puffs by inhalation Every 6 hours as needed    beclomethasone dipropionate (QVAR REDIHALER) 80 mcg/actuation Inhalation oral inhaler Take 1 Puff by inhalation Twice daily    dicyclomine (BENTYL) 20 mg Oral Tablet Take 1 Tablet (20 mg total) by mouth Four times a day    dilTIAZem (CARDIZEM CD) 120 mg Oral Capsule, Sust. Release 24 hr Take 1 Capsule (120 mg total) by mouth Once a day    gabapentin (NEURONTIN) 300 mg Oral Capsule Take 1 Capsule (300 mg total) by mouth Four times a day    hydroCHLOROthiazide (HYDRODIURIL) 25 mg Oral Tablet Take 1 Tablet (25 mg total) by mouth Once a day    HYDROcodone-acetaminophen (NORCO) 10-325 mg Oral Tablet Take 1 Tablet by mouth Every 6 hours as needed for Pain    levothyroxine (SYNTHROID) 175 mcg Oral Tablet Take 1 Tablet (175 mcg total) by mouth Every morning    LIDOCAINE VISCOUS 2 % Mucous Membrane Solution Once per day as needed    lidocaine-prilocaine (EMLA) 2.5-2.5 % Cream APPLY TOPICALLY AS A ONE-TIME DOSE    loratadine (CLARITIN) 10 mg Oral Tablet Take 1 Tablet (10 mg total) by mouth Once a day    losartan (COZAAR) 50 mg Oral Tablet Take 1 Tablet (50 mg total) by mouth Once a day    Magic Mouthwash Swish and spit 10 mL Every 2 hours as needed for Sore throat Contains lidocaine viscous 2%, diphenhydramine 12.5 mg/5 ml, Maalox. Mix 1:1:1    magnesium Oxide 420 mg Oral Tablet Take 486 mg by mouth Twice daily    Methylprednisolone (MEDROL DOSEPACK)  4 mg Oral Tablets, Dose Pack Take as instructed.    montelukast (SINGULAIR) 10 mg Oral Tablet Take 1 Tablet (10 mg total) by mouth Once a day    nystatin (MYCOSTATIN) 100,000 unit/mL Oral Suspension Take 5 mL by mouth Four times a day    omeprazole (PRILOSEC) 20 mg Oral Capsule, Delayed Release(E.C.) Take 1 Capsule (20 mg total) by mouth Once a day    potassium chloride (K-DUR) 20 mEq Oral Tab Sust.Rel. Particle/Crystal Take 1 Tablet (20 mEq total) by mouth Once a day (Patient taking differently: Take 1 Tablet (20 mEq total) by mouth Once a day Has not been taking)    promethazine (PHENERGAN) 25 mg Oral Tablet TAKE 1 TABLET BY MOUTH EVERY 6 HOURS AS NEEDED FOR NAUSEA AND VOMITING    sertraline (ZOLOFT) 100 mg Oral Tablet Take 1 Tablet (100 mg total) by mouth Once a day     Allergies   Allergen Reactions    Dexamethasone (Pf) Swelling    Tamsulosin Nausea/ Vomiting  PHYSICAL EXAM:  Most Recent Vitals    Flowsheet Row Telemedicine from 04/24/2022 in Hematology/Oncology,   [facility]   Temperature 36.6 C (97.8 F) filed at... 04/24/2022 1338   Heart Rate 104 filed at... 04/24/2022 1338   Respiratory Rate --   BP (Non-Invasive) 147/99 filed at... 04/24/2022 1338   SpO2 96 % filed at... 04/24/2022 1338   Height 1.651 m (5\' 5" ) filed at... 04/24/2022 1338   Weight 121 kg (266 lb 6.4 oz) filed at... 04/24/2022 1338   BMI (Calculated) 44.42 filed at... 04/24/2022 1338   BSA (Calculated) 2.35 filed at... 04/24/2022 1338      ECOG Status: (0) Fully active, able to carry on all predisease performance without restriction   Physical Exam    DIAGNOSTIC DATA:  No results found for this or any previous visit (from the past 01027 hour(s)).    LABS:   CBC  Diff   Lab Results   Component Value Date/Time    WBC 11.7 11/22/2022 09:28 AM    HGB 12.3 11/22/2022 09:28 AM    HCT 36.7 11/22/2022 09:28 AM    PLTCNT 291 11/22/2022 09:28 AM    RBC 3.72 11/22/2022 09:28 AM    MCV 98.7 (H) 11/22/2022 09:28 AM    MCHC 33.6  11/22/2022 09:28 AM    MCH 33.2 (H) 11/22/2022 09:28 AM    RDW 14.0 11/22/2022 09:28 AM    MPV 8.0 11/22/2022 09:28 AM    Lab Results   Component Value Date/Time    PMNS 65 11/22/2022 09:28 AM    LYMPHOCYTES 18 11/22/2022 09:28 AM    EOSINOPHIL 7 11/22/2022 09:28 AM    MONOCYTES 8 11/22/2022 09:28 AM    BASOPHILS 1 11/22/2022 09:28 AM    BASOPHILS 0.10 11/22/2022 09:28 AM    PMNABS 7.60 11/22/2022 09:28 AM    LYMPHSABS 2.10 11/22/2022 09:28 AM    EOSABS 0.80 (H) 11/22/2022 09:28 AM    MONOSABS 1.00 11/22/2022 09:28 AM            ASSESSMENT:    ICD-10-CM    1. Malignant neoplasm of upper-outer quadrant of right breast in female, estrogen receptor negative (CMS HCC)  C50.411     Z17.1              PLAN:   1. All relevant medical records were reviewed including available pertinent provider notes, procedure notes, imaging, laboratory, and pathology.   2. All pertinent labs and/or imaging were reviewed with the patient.   3. Triple negative breast cancer:  We previously reviewed the staging information with the patient, and reviewed the NCCN guidelines.  She was initially either T2 N0 M0 or T2 N1 M0.  Either one of those would be categorized as stage II.  According to NCCN guidelines, this fell into the high-risk triple negative category. BRCA mutation status was negative.  She has completed the neoadjuvant chemotherapy regimen for high-risk triple negative breast cancer with the exception of pembrolizumab which will continue.  She had a pathologic complete response and is excited about it.   4. Kidney stone: We will go ahead and administer additional pain medication today and I will send in a prescription to her pharmacy.    Rachel Mendoza was given the chance to ask questions, and these were answered to their satisfaction. The patient is welcome to call with any questions or concerns in the meantime.     On the day of the encounter, a total of 35  minutes was spent on this patient encounter including review of  historical information, examination, documentation and post-visit activities.   Return in about 6 weeks (around 01/03/2023).     Lupita Dawn, MD  11/22/2022, 11:04  The patient was seen as part of a collaborative telemedicine service with Dr. Damita Lack who participated in the encounter by active presence via approved video/audio means for portions of the encounter.  The patient's insurance company bears full legal and financial responsibility resulting from any deviations that they cause to my recommended treatment plan.   CC:  [name], FNP-BC  [address]  [place] [address]    No referring provider defined for this encounter.    This note was partially generated using MModal Fluency Direct system, and there may be some incorrect words, spellings, and punctuation that were not noted in checking the note before saving.

## 2022-11-22 NOTE — Nursing Note (Signed)
Triaged patient and placed in room #1, vital were okay with exception of HR being 110. She is having some severe itching. Which than turns into red welps and swelling. She is also in them midst of passing a kidney stone so she is in a lot pain. She almost cancelled her appt but decided to come.

## 2022-11-22 NOTE — Nursing Note (Signed)
Patient completed psychosocial distress screening using the Enhanced NCCN Distress Thermometer (DT) Tool and self-reported an overall score of 7.   Patient states she rates distress tool a 7 due to the pain she is having from a kidney stone.  Patient states she has had multiple kidney stones in the past and may go to the ER after her visit today.

## 2022-11-22 NOTE — Nurses Notes (Addendum)
1120 Pt ambulatory to OP Onc unit with husband after appt with Dr.Mackey. Labs drawn peripherally in Oncology Clinic. Felecia Jan, RN  [address] accessed with ease. Excellent blood return and flushes easily. Felecia Jan, RN  1200 Mag 1.8 Pt does not require IV Mag. TSH 0.242. Pt to f/u with PCP. Felecia Jan, RN  [phone] Dilaudid 2 mg IVP given. Pt states she has kidney stones and c/o R back/abd pain. Describes pain as sharp and aching at times but pain is continuous. Pt rates pain 6/10 on pain scale. Felecia Jan, RN  [phone] Keytruda 200 mg IV infusion started. Felecia Jan, RN  [phone] Pt denies pain at present time. States she can't believe how good she feels. Felecia Jan, RN  [phone] Keytruda infusion completed. Line flushed with NS. Felecia Jan, RN  1255 VS obtained. Felecia Jan, RN  [phone] Port flushed with 30 ml NS. Demetrios Isaacs d/c'ed. Site clear. Band aid applied. Pt tolerated tx without difficulty. Felecia Jan, RN  1325 Pt left OP Onc unit ambulatory with husband to assist. Pt declined wheelchair. Felecia Jan, RN

## 2022-11-24 ENCOUNTER — Other Ambulatory Visit (RURAL_HEALTH_CENTER): Payer: Self-pay | Admitting: Family

## 2022-11-26 ENCOUNTER — Encounter (HOSPITAL_COMMUNITY): Payer: Self-pay | Admitting: HEMATOLOGY-ONCOLOGY

## 2022-11-27 ENCOUNTER — Encounter (HOSPITAL_COMMUNITY): Payer: Self-pay | Admitting: HEMATOLOGY-ONCOLOGY

## 2022-12-03 ENCOUNTER — Encounter (HOSPITAL_COMMUNITY): Payer: Self-pay | Admitting: HEMATOLOGY-ONCOLOGY

## 2022-12-06 ENCOUNTER — Ambulatory Visit (INDEPENDENT_AMBULATORY_CARE_PROVIDER_SITE_OTHER): Payer: Self-pay

## 2022-12-06 ENCOUNTER — Ambulatory Visit (HOSPITAL_COMMUNITY): Payer: Commercial Managed Care - PPO

## 2022-12-13 ENCOUNTER — Other Ambulatory Visit: Payer: Self-pay

## 2022-12-13 ENCOUNTER — Ambulatory Visit (HOSPITAL_BASED_OUTPATIENT_CLINIC_OR_DEPARTMENT_OTHER): Payer: PRIVATE HEALTH INSURANCE | Admitting: HEMATOLOGY-ONCOLOGY

## 2022-12-13 ENCOUNTER — Ambulatory Visit (INDEPENDENT_AMBULATORY_CARE_PROVIDER_SITE_OTHER)
Admission: RE | Admit: 2022-12-13 | Discharge: 2022-12-13 | Disposition: A | Discharge status: Home / Self Care | Payer: PRIVATE HEALTH INSURANCE | Source: Ambulatory Visit | Attending: HEMATOLOGY-ONCOLOGY | Admitting: HEMATOLOGY-ONCOLOGY

## 2022-12-13 ENCOUNTER — Ambulatory Visit
Admission: RE | Admit: 2022-12-13 | Discharge: 2022-12-13 | Disposition: A | Discharge status: Home / Self Care | Payer: PRIVATE HEALTH INSURANCE | Source: Ambulatory Visit | Attending: HEMATOLOGY-ONCOLOGY | Admitting: HEMATOLOGY-ONCOLOGY

## 2022-12-13 ENCOUNTER — Encounter (INDEPENDENT_AMBULATORY_CARE_PROVIDER_SITE_OTHER): Payer: Self-pay | Admitting: HEMATOLOGY-ONCOLOGY

## 2022-12-13 ENCOUNTER — Other Ambulatory Visit (INDEPENDENT_AMBULATORY_CARE_PROVIDER_SITE_OTHER): Payer: Self-pay | Admitting: HEMATOLOGY-ONCOLOGY

## 2022-12-13 VITALS — BP 149/92 | HR 119 | Temp 96.7°F | Ht 65.0 in | Wt 250.8 lb

## 2022-12-13 VITALS — BP 107/66 | HR 113 | Temp 96.2°F | Resp 18

## 2022-12-13 DIAGNOSIS — Z171 Estrogen receptor negative status [ER-]: Secondary | ICD-10-CM

## 2022-12-13 DIAGNOSIS — C50411 Malignant neoplasm of upper-outer quadrant of right female breast: Secondary | ICD-10-CM

## 2022-12-13 DIAGNOSIS — Z5112 Encounter for antineoplastic immunotherapy: Secondary | ICD-10-CM | POA: Insufficient documentation

## 2022-12-13 LAB — CBC WITH DIFF
BASOPHIL #: 0.1 10*3/uL (ref 0.00–0.10)
BASOPHIL %: 1 % (ref 0–1)
EOSINOPHIL #: 0.3 10*3/uL (ref 0.00–0.50)
EOSINOPHIL %: 3 % (ref 1–7)
HCT: 37.9 % (ref 31.2–41.9)
HGB: 12.9 g/dL (ref 10.9–14.3)
LYMPHOCYTE #: 1.9 10*3/uL (ref 1.00–3.00)
LYMPHOCYTE %: 18 % (ref 16–44)
MCH: 32.6 pg (ref 24.7–32.8)
MCHC: 34.1 g/dL (ref 32.3–35.6)
MCV: 95.8 fL — ABNORMAL HIGH (ref 75.5–95.3)
MONOCYTE #: 0.4 10*3/uL (ref 0.30–1.00)
MONOCYTE %: 4 % — ABNORMAL LOW (ref 5–13)
MPV: 8 fL (ref 7.9–10.8)
NEUTROPHIL #: 7.5 10*3/uL (ref 1.85–7.80)
NEUTROPHIL %: 73 % (ref 43–77)
PLATELETS: 266 10*3/uL (ref 140–440)
RBC: 3.95 10*6/uL (ref 3.63–4.92)
RDW: 14 % (ref 12.3–17.7)
WBC: 10.2 10*3/uL (ref 3.8–11.8)

## 2022-12-13 LAB — COMPREHENSIVE METABOLIC PANEL, NON-FASTING
ALBUMIN/GLOBULIN RATIO: 1.1 (ref 0.8–1.4)
ALBUMIN: 4.1 g/dL (ref 3.5–5.7)
ALKALINE PHOSPHATASE: 99 U/L (ref 34–104)
ALT (SGPT): 33 U/L (ref 7–52)
ANION GAP: 10 mmol/L (ref 4–13)
AST (SGOT): 24 U/L (ref 13–39)
BILIRUBIN TOTAL: 0.4 mg/dL (ref 0.3–1.0)
BUN/CREA RATIO: 12 (ref 6–22)
BUN: 13 mg/dL (ref 7–25)
CALCIUM, CORRECTED: 9.7 mg/dL (ref 8.9–10.8)
CALCIUM: 9.8 mg/dL (ref 8.6–10.3)
CHLORIDE: 99 mmol/L (ref 98–107)
CO2 TOTAL: 26 mmol/L (ref 21–31)
CREATININE: 1.05 mg/dL (ref 0.60–1.30)
ESTIMATED GFR: 66 mL/min/{1.73_m2} (ref >59)
GLOBULIN: 3.7 (ref 2.9–5.4)
GLUCOSE: 171 mg/dL — ABNORMAL HIGH (ref 74–109)
OSMOLALITY, CALCULATED: 274 mOsm/kg (ref 270–290)
POTASSIUM: 3.6 mmol/L (ref 3.5–5.1)
PROTEIN TOTAL: 7.8 g/dL (ref 6.4–8.9)
SODIUM: 135 mmol/L — ABNORMAL LOW (ref 136–145)

## 2022-12-13 LAB — MAGNESIUM: MAGNESIUM: 1.8 mg/dL — ABNORMAL LOW (ref 1.9–2.7)

## 2022-12-13 LAB — THYROID STIMULATING HORMONE WITH FREE T4 REFLEX: TSH: 0.588 u[IU]/mL (ref 0.450–5.330)

## 2022-12-13 MED ORDER — SODIUM CHLORIDE 0.9% FLUSH BAG - 250 ML
INTRAVENOUS | Status: DC | PRN
Start: 2022-12-13 — End: 2022-12-14

## 2022-12-13 MED ORDER — ALBUTEROL SULFATE HFA 90 MCG/ACTUATION AEROSOL INHALER - RN
2.0000 | Freq: Once | RESPIRATORY_TRACT | Status: DC | PRN
Start: 2022-12-13 — End: 2022-12-14

## 2022-12-13 MED ORDER — DIPHENHYDRAMINE 50 MG/ML INJECTION SOLUTION
50.0000 mg | Freq: Once | INTRAMUSCULAR | Status: DC | PRN
Start: 2022-12-13 — End: 2022-12-14

## 2022-12-13 MED ORDER — ALBUTEROL SULFATE 2.5 MG/3 ML (0.083 %) SOLUTION FOR NEBULIZATION
2.5000 mg | INHALATION_SOLUTION | Freq: Once | RESPIRATORY_TRACT | Status: DC | PRN
Start: 2022-12-13 — End: 2022-12-14

## 2022-12-13 MED ORDER — MEPERIDINE (PF) 25 MG/ML INJECTION SOLUTION
12.5000 mg | Freq: Once | INTRAMUSCULAR | Status: DC | PRN
Start: 2022-12-13 — End: 2022-12-14

## 2022-12-13 MED ORDER — SODIUM CHLORIDE 0.9 % INTRAVENOUS SOLUTION
200.0000 mg | Freq: Once | INTRAVENOUS | Status: AC
Start: 2022-12-13 — End: 2022-12-13
  Administered 2022-12-13: 200 mg via INTRAVENOUS
  Administered 2022-12-13: 0 mg via INTRAVENOUS
  Filled 2022-12-13: qty 8

## 2022-12-13 MED ORDER — DEXTROSE 5% IN WATER (D5W) FLUSH BAG - 250 ML
INTRAVENOUS | Status: DC | PRN
Start: 2022-12-13 — End: 2022-12-14

## 2022-12-13 MED ORDER — EPINEPHRINE 1 MG/ML (1 ML) INJECTION SOLUTION
0.3000 mg | Freq: Once | INTRAMUSCULAR | Status: DC | PRN
Start: 2022-12-13 — End: 2022-12-14

## 2022-12-13 MED ORDER — DIPHENHYDRAMINE 50 MG/ML INJECTION SOLUTION
25.0000 mg | Freq: Once | INTRAMUSCULAR | Status: DC | PRN
Start: 2022-12-13 — End: 2022-12-14

## 2022-12-13 MED ORDER — HYDROCORTISONE SOD SUCCINATE 100 MG/2 ML VIAL WRAPPER
100.0000 mg | Freq: Once | INTRAMUSCULAR | Status: DC | PRN
Start: 2022-12-13 — End: 2022-12-14

## 2022-12-13 MED ORDER — FAMOTIDINE (PF) 20 MG/2 ML INTRAVENOUS SOLUTION
20.0000 mg | Freq: Once | INTRAVENOUS | Status: DC | PRN
Start: 2022-12-13 — End: 2022-12-14

## 2022-12-13 NOTE — Progress Notes (Signed)
Department of Hematology/Oncology  History and Physical    Name: Rachel Mendoza  VPX:[id number]  Date of Birth: [birth year]  Encounter Date: 12/13/2022    REFERRING PROVIDER:  Noel Christmas, MD  [address]  [place],  [place] [address]    REASON FOR OFFICE VISIT:  New patient for evaluation and management of triple negative breast cancer.    HISTORY OF PRESENT ILLNESS:  Rachel Mendoza is a 47 y.o. female who presents today for initial medical oncology consultation regarding triple negative breast cancer.  The patient discovered a lump which was evaluated with mammogram, ultrasound, and biopsy.  The primary lesion measures in the range of 2.2-2.9 cm, and there were some nearby lymph nodes that were described as prominent although not obviously involved.  The narrow measurement for the lymph nodes was in the range of 1.1 cm.    The biopsy showed invasive ductal malignancy that was triple negative.  BRCA testing is currently pending.  Based on the above information, she was referred for consideration of neoadjuvant chemotherapy.    05/10/2022: The patient is here for follow up of triple negative breast cancer.  We still do not have the BRCA result.  We have also been trying to obtain a pretreatment PET-CT, but have not been able to obtain approval for it yet.    05/31/2022: The patient is here for follow up of triple negative breast cancer.  She states that she has noticed a dramatic improvement in the size of the lesion.  She states that she struggles to palpate it now.    06/21/2022: The patient is here for follow up of triple negative breast cancer.  She states that she is having fatigue and various other issues related to treatment.    09/13/2022: The patient is here for follow up of triple negative breast cancer. She states that she feels bad for approximately 10 days after chemotherapy treatments.     10/04/2022: The patient is here for follow up of triple negative breast cancer.  She states that she has  a problem with dizziness when she stands up too quickly.  Otherwise, she is doing about the same.    10/25/2022: The patient is here for follow up of triple negative breast cancer.  Since the last visit, she did meet with a breast surgeon and the plan is to proceed with a lumpectomy at the end of the month.    11/22/2022:  The patient is here for follow up of triple negative breast cancer.  Since the last visit, she had surgery and was found to have a pathologic complete response.  She is doing well from the postsurgical standpoint, but states that she is in the process of trying to pass a kidney stone    12/13/2022: The patient is here for follow up of triple negative breast cancer.  She is doing relatively well on pembrolizumab.  She states that she does have some pruritus, but it is reasonably well managed with consumption of Benadryl.    ROS:   Review of Systems   Constitutional:  Negative for appetite change, chills and fatigue.   HENT:   Negative for sore throat and trouble swallowing.    Eyes:  Negative for eye problems.   Respiratory:  Negative for cough and shortness of breath.    Cardiovascular:  Negative for chest pain and leg swelling.   Gastrointestinal:  Negative for abdominal pain.   Genitourinary:  Negative for dysuria and hematuria.  Musculoskeletal:  Negative for arthralgias and gait problem.   Skin:  Negative for rash.   Neurological:  Negative for gait problem.   Hematological:  Negative for adenopathy.   Psychiatric/Behavioral:  Negative for depression.         HISTORY:  Past Medical History:   Diagnosis Date    Allergic rhinitis     Anal fissure     Asthma     Bradycardia     CPAP (continuous positive airway pressure) dependence     Depression     Dysrhythmias     tachy after anesthesia for back surgery leading to prolonged stay    Esophageal reflux     History of anesthesia complications     hx of slow to wake    Hx of breast cancer     Hypersomnia     Hypertension     Hypothyroidism      Insomnia, unspecified     Kidney stones 06/06/2021    Nerve damage     Obesity, unspecified     Shortness of breath     Sleep apnea     Tachycardia, unspecified     Thyroid disorder     Wears glasses          Past Surgical History:   Procedure Laterality Date    HX BACK SURGERY      HX BREAST BIOPSY Right     HX HAND SURGERY Left     left thumb    HX HYSTERECTOMY      HX LITHOTRIPSY      HX TONSILLECTOMY      HX TUBAL LIGATION Bilateral     PORTACATH PLACEMENT           Social History     Socioeconomic History    Marital status: Married     Spouse name: Not on file    Number of children: Not on file    Years of education: Not on file    Highest education level: Not on file   Occupational History    Not on file   Tobacco Use    Smoking status: Every Day     Current packs/day: 0.50     Average packs/day: 0.5 packs/day for 36.7 years (18.3 ttl pk-yrs)     Types: Cigarettes     Start date: 04/08/1986     Passive exposure: Never    Smokeless tobacco: Never    Tobacco comments:     1800 quit now   Vaping Use    Vaping status: Never Used   Substance and Sexual Activity    Alcohol use: Not Currently     Comment: occasionally    Drug use: Yes     Types: Marijuana     Comment: CBD Gummies    Sexual activity: Not Currently   Other Topics Concern    Ability to Walk 1 Flight of Steps without SOB/CP No    Routine Exercise No    Ability to Walk 2 Flight of Steps without SOB/CP Not Asked    Unable to Ambulate Not Asked    Total Care Not Asked    Ability To Do Own ADL's Not Asked    Uses Walker No    Other Activity Level No     Comment: chemo has made her very weak, almost no activity    Uses Cane No   Social History Narrative    Not on file     Social Determinants of Health  Financial Resource Strain: Not on file   Transportation Needs: Not on file   Social Connections: Not on file   Intimate Partner Violence: Not on file   Housing Stability: Not on file     Family Medical History:       Problem Relation (Age of Onset)    Asthma  Brother, Maternal Grandmother    Breast Cancer Paternal Grandmother    Diabetes type II Father, Paternal Grandmother    Elevated Lipids Father    Heart Disease Father    Hypertension (High Blood Pressure) Mother, Father    No Known Problems Sister, Maternal Aunt, Maternal Uncle, Paternal Aunt, Paternal Uncle, Maternal Grandfather, Paternal Grandfather, Daughter, Son, Other            Current Outpatient Medications   Medication Sig    albuterol sulfate (PROVENTIL OR VENTOLIN OR PROAIR) 90 mcg/actuation Inhalation oral inhaler Take 1-2 Puffs by inhalation Every 6 hours as needed    beclomethasone dipropionate (QVAR REDIHALER) 80 mcg/actuation Inhalation oral inhaler Take 1 Puff by inhalation Twice daily    dicyclomine (BENTYL) 20 mg Oral Tablet Take 1 Tablet (20 mg total) by mouth Four times a day    dilTIAZem (CARDIZEM CD) 120 mg Oral Capsule, Sust. Release 24 hr Take 1 Capsule (120 mg total) by mouth Once a day    gabapentin (NEURONTIN) 300 mg Oral Capsule Take 1 Capsule (300 mg total) by mouth Four times a day    hydroCHLOROthiazide (HYDRODIURIL) 25 mg Oral Tablet Take 1 Tablet (25 mg total) by mouth Once a day    HYDROcodone-acetaminophen (NORCO) 10-325 mg Oral Tablet Take 1 Tablet by mouth Every 6 hours as needed for Pain    levothyroxine (SYNTHROID) 175 mcg Oral Tablet Take 1 Tablet (175 mcg total) by mouth Every morning    LIDOCAINE VISCOUS 2 % Mucous Membrane Solution Once per day as needed    lidocaine-prilocaine (EMLA) 2.5-2.5 % Cream APPLY TOPICALLY AS A ONE-TIME DOSE    loratadine (CLARITIN) 10 mg Oral Tablet Take 1 Tablet (10 mg total) by mouth Once a day    losartan (COZAAR) 50 mg Oral Tablet Take 1 Tablet (50 mg total) by mouth Once a day    Magic Mouthwash Swish and spit 10 mL Every 2 hours as needed for Sore throat Contains lidocaine viscous 2%, diphenhydramine 12.5 mg/5 ml, Maalox. Mix 1:1:1    magnesium Oxide 420 mg Oral Tablet Take 486 mg by mouth Twice daily    Methylprednisolone (MEDROL DOSEPACK)  4 mg Oral Tablets, Dose Pack Take as instructed.    montelukast (SINGULAIR) 10 mg Oral Tablet Take 1 Tablet (10 mg total) by mouth Once a day    nystatin (MYCOSTATIN) 100,000 unit/mL Oral Suspension Take 5 mL by mouth Four times a day    omeprazole (PRILOSEC) 20 mg Oral Capsule, Delayed Release(E.C.) Take 1 Capsule (20 mg total) by mouth Once a day    potassium chloride (K-DUR) 20 mEq Oral Tab Sust.Rel. Particle/Crystal Take 1 Tablet (20 mEq total) by mouth Once a day (Patient taking differently: Take 1 Tablet (20 mEq total) by mouth Once a day Has not been taking)    promethazine (PHENERGAN) 25 mg Oral Tablet TAKE 1 TABLET BY MOUTH EVERY 6 HOURS AS NEEDED FOR NAUSEA AND VOMITING    sertraline (ZOLOFT) 100 mg Oral Tablet Take 1 Tablet (100 mg total) by mouth Once a day     Allergies   Allergen Reactions    Dexamethasone (Pf) Swelling  Tamsulosin Nausea/ Vomiting       PHYSICAL EXAM:  Most Recent Vitals    Flowsheet Row Telemedicine from 04/24/2022 in Hematology/Oncology,   [facility]   Temperature 36.6 C (97.8 F) filed at... 04/24/2022 1338   Heart Rate 104 filed at... 04/24/2022 1338   Respiratory Rate --   BP (Non-Invasive) 147/99 filed at... 04/24/2022 1338   SpO2 96 % filed at... 04/24/2022 1338   Height 1.651 m (5\' 5" ) filed at... 04/24/2022 1338   Weight 121 kg (266 lb 6.4 oz) filed at... 04/24/2022 1338   BMI (Calculated) 44.42 filed at... 04/24/2022 1338   BSA (Calculated) 2.35 filed at... 04/24/2022 1338      ECOG Status: (0) Fully active, able to carry on all predisease performance without restriction   Physical Exam    DIAGNOSTIC DATA:  No results found for this or any previous visit (from the past 40981 hour(s)).    LABS:   CBC  Diff   Lab Results   Component Value Date/Time    WBC 10.2 12/13/2022 09:04 AM    HGB 12.9 12/13/2022 09:04 AM    HCT 37.9 12/13/2022 09:04 AM    PLTCNT 266 12/13/2022 09:04 AM    RBC 3.95 12/13/2022 09:04 AM    MCV 95.8 (H) 12/13/2022 09:04 AM    MCHC 34.1  12/13/2022 09:04 AM    MCH 32.6 12/13/2022 09:04 AM    RDW 14.0 12/13/2022 09:04 AM    MPV 8.0 12/13/2022 09:04 AM    Lab Results   Component Value Date/Time    PMNS 73 12/13/2022 09:04 AM    LYMPHOCYTES 18 12/13/2022 09:04 AM    EOSINOPHIL 3 12/13/2022 09:04 AM    MONOCYTES 4 (L) 12/13/2022 09:04 AM    BASOPHILS 1 12/13/2022 09:04 AM    BASOPHILS 0.10 12/13/2022 09:04 AM    PMNABS 7.50 12/13/2022 09:04 AM    LYMPHSABS 1.90 12/13/2022 09:04 AM    EOSABS 0.30 12/13/2022 09:04 AM    MONOSABS 0.40 12/13/2022 09:04 AM            ASSESSMENT:    ICD-10-CM    1. Malignant neoplasm of upper-outer quadrant of right breast in female, estrogen receptor negative (CMS HCC)  C50.411     Z17.1              PLAN:   1. All relevant medical records were reviewed including available pertinent provider notes, procedure notes, imaging, laboratory, and pathology.   2. All pertinent labs and/or imaging were reviewed with the patient.   3. Triple negative breast cancer:  We previously reviewed the staging information with the patient, and reviewed the NCCN guidelines.  She was initially either T2 N0 M0 or T2 N1 M0.  Either one of those would be categorized as stage II.  According to NCCN guidelines, this fell into the high-risk triple negative category. BRCA mutation status was negative.  She has completed the neoadjuvant chemotherapy regimen for high-risk triple negative breast cancer with the exception of pembrolizumab which will continue.  She had a pathologic complete response.  We discussed radiation therapy, and she states that she is inclined to decline it at this time.  I advised her that it is a strong recommendation according to current guidelines.  We discussed that we could follow a Signatera results to make sure that she is at a high likelihood of cure, so we will make arrangements to start that today and will check it monthly for 12  months.    Rachel Mendoza was given the chance to ask questions, and these were answered to  their satisfaction. The patient is welcome to call with any questions or concerns in the meantime.     On the day of the encounter, a total of 35 minutes was spent on this patient encounter including review of historical information, examination, documentation and post-visit activities.   Return in about 3 weeks (around 01/03/2023).     Lupita Dawn, MD  12/13/2022, 09:39  The patient's insurance company bears full legal and financial responsibility resulting from any deviations that they cause to my recommended treatment plan.   CC:  [name], FNP-BC  [address]  [place] [address]    Noel Christmas, MD  [address]  [place],  [place] [address]    This note was partially generated using MModal Fluency Direct system, and there may be some incorrect words, spellings, and punctuation that were not noted in checking the note before saving.

## 2022-12-13 NOTE — Nurses Notes (Signed)
1005- Arrived to unit ambulatory with husband at side. Seen provider in ONC clinic. Weight, VS, and labs collected at clinic. Rachel Deed, RN   [phone]- All assessments complete. States she has chronic fatigue and occasional nausea. Continues to have chronic neuropathy. States she has dry, itchy skin with no change from previous weeks, Dr Damita Lack aware.  Right chest Portacath accessed, blood return noted, flushed with 20mL NS. Signatera box collected per orders. Dressing applied. No complaints voiced. States she gets a "thrush like" film on tongue for a few days following Keytruda infusion. Advised to try magic mouthwash that she has at home.   Patient Assessment/Symptom Management Patient Has No MD Appointment Today   Key: (+) Symptom present           (-)  Symptom not present If Symptom is Positive(+) a Nursing Note is required   Edema -   Uncontrolled Nausea -   Vomiting -   Inability to eat/drink -   Mouth Sores -   Diarrhea -   Constipation (? Last BM) -   Fatigue that interferes with ADL's +   Numbness/Tingling -change -   Other -   Fever/Signs & Symptoms of infection -   Nurse Initials VB   Labs resulted. Treatment plan released to pharmacy. Rachel Deed, RN   [phone]- Keytruda 200 mg infusion started. Encouraged to call out for any changes. Rachel Deed, RN   [phone]- Keytruda infusion complete, line flushing. Tolerated well. No complaints voiced. Rachel Deed, RN   1150- VSS. Right chest Portacath flushed with 20mL NS, deaccessed, pressure dressing applied. Rachel Deed, RN   [phone]- Left unit ambulatory with husband. Rachel Deed, RN

## 2022-12-13 NOTE — Nursing Note (Signed)
Signatera mailed at this time.

## 2022-12-18 ENCOUNTER — Telehealth (HOSPITAL_BASED_OUTPATIENT_CLINIC_OR_DEPARTMENT_OTHER): Payer: Self-pay | Admitting: Medical

## 2022-12-18 ENCOUNTER — Ambulatory Visit: Payer: Commercial Managed Care - PPO | Attending: HEMATOLOGY-ONCOLOGY | Admitting: Medical

## 2022-12-18 NOTE — Progress Notes (Unsigned)
[facility], [facility]  [address]  [place] [address]  Operated by [facility]  Telephone Visit    Name:  Rachel Mendoza MRN: [id number]   Date:  12/18/2022 Age:   47 y.o.     The patient initiated a request for telephone service.  Verbal consent for this service was obtained from the patient.    Last office visit in this department: 11/18/2022      Reason for call: Coordination of care visit    Call notes:  Patient is a 47 y.o.  female who presents for a telephone encounter, coordination of care visit on, 12/18/2022. Patient underwent a RIGHT mastectomy with SLNbx 11/05/22 with Dr. Fredric Mare due to a personal hx of RIGHT breast invasive ductal carcinoma, clinical T2N0, Stage IIA/IIB. ER negative, PR negative, HER2 negative, Ki67 90%.    Pathology showed Breast tissue with therapy related/previous biopsy site changes (no residual carcinoma present),ypT0 snN0, -0/4 LN.     She completed neoadjuvant chemotherapy. She follows with Dr. Damita Lack in med onc. She will continue on pembro. She was offered referral to rad onc and declined.     She has been referred to radiation oncology    She reports she is doing well. No new breast complaint.     ROS: negative    Plan:  RTC in 6 months  L screening mammogram due on/after 03/15/2023            No diagnosis found.    Total provider time spent with the patient on the phone: *** minutes.    Delana Meyer, PA-C

## 2022-12-18 NOTE — Telephone Encounter (Signed)
Attempted to call patient for telephone visit. Reached her voicemail. Message left requesting a return call.  Delana Meyer, PA-C    12/18/2022 11:52

## 2022-12-19 ENCOUNTER — Encounter (HOSPITAL_COMMUNITY): Payer: Self-pay | Admitting: HEMATOLOGY-ONCOLOGY

## 2022-12-26 ENCOUNTER — Encounter (HOSPITAL_COMMUNITY): Payer: Self-pay | Admitting: HEMATOLOGY-ONCOLOGY

## 2022-12-27 ENCOUNTER — Ambulatory Visit (HOSPITAL_COMMUNITY): Payer: Commercial Managed Care - PPO

## 2023-01-02 LAB — SIGNATERA
SIGNATERA MTM READOUT: 0 MTM/ml
SIGNATERA TEST RESULT: NEGATIVE

## 2023-01-03 ENCOUNTER — Other Ambulatory Visit: Payer: Self-pay

## 2023-01-03 ENCOUNTER — Ambulatory Visit (INDEPENDENT_AMBULATORY_CARE_PROVIDER_SITE_OTHER)
Admission: RE | Admit: 2023-01-03 | Discharge: 2023-01-03 | Disposition: A | Discharge status: Home / Self Care | Payer: Commercial Managed Care - PPO | Source: Ambulatory Visit | Attending: HEMATOLOGY-ONCOLOGY | Admitting: HEMATOLOGY-ONCOLOGY

## 2023-01-03 ENCOUNTER — Ambulatory Visit: Payer: Commercial Managed Care - PPO | Admitting: HEMATOLOGY-ONCOLOGY

## 2023-01-03 ENCOUNTER — Ambulatory Visit
Admission: RE | Admit: 2023-01-03 | Discharge: 2023-01-03 | Disposition: A | Discharge status: Home / Self Care | Payer: Commercial Managed Care - PPO | Source: Ambulatory Visit | Attending: HEMATOLOGY-ONCOLOGY | Admitting: HEMATOLOGY-ONCOLOGY

## 2023-01-03 ENCOUNTER — Encounter (INDEPENDENT_AMBULATORY_CARE_PROVIDER_SITE_OTHER): Payer: Self-pay | Admitting: HEMATOLOGY-ONCOLOGY

## 2023-01-03 ENCOUNTER — Other Ambulatory Visit (INDEPENDENT_AMBULATORY_CARE_PROVIDER_SITE_OTHER): Payer: Self-pay | Admitting: HEMATOLOGY-ONCOLOGY

## 2023-01-03 VITALS — BP 101/90 | HR 103 | Temp 97.2°F | Resp 18

## 2023-01-03 VITALS — BP 120/92 | HR 105 | Temp 98.4°F | Ht 65.0 in | Wt 252.0 lb

## 2023-01-03 DIAGNOSIS — Z171 Estrogen receptor negative status [ER-]: Secondary | ICD-10-CM

## 2023-01-03 DIAGNOSIS — Z5112 Encounter for antineoplastic immunotherapy: Secondary | ICD-10-CM | POA: Insufficient documentation

## 2023-01-03 DIAGNOSIS — C50911 Malignant neoplasm of unspecified site of right female breast: Secondary | ICD-10-CM | POA: Insufficient documentation

## 2023-01-03 DIAGNOSIS — C50411 Malignant neoplasm of upper-outer quadrant of right female breast: Secondary | ICD-10-CM

## 2023-01-03 LAB — THYROID STIMULATING HORMONE WITH FREE T4 REFLEX: TSH: 0.06 u[IU]/mL — ABNORMAL LOW (ref 0.450–5.330)

## 2023-01-03 LAB — COMPREHENSIVE METABOLIC PANEL, NON-FASTING
ALBUMIN/GLOBULIN RATIO: 1.2 (ref 0.8–1.4)
ALBUMIN: 4 g/dL (ref 3.5–5.7)
ALKALINE PHOSPHATASE: 103 U/L (ref 34–104)
ALT (SGPT): 23 U/L (ref 7–52)
ANION GAP: 11 mmol/L (ref 4–13)
AST (SGOT): 20 U/L (ref 13–39)
BILIRUBIN TOTAL: 0.4 mg/dL (ref 0.3–1.0)
BUN/CREA RATIO: 19 (ref 6–22)
BUN: 17 mg/dL (ref 7–25)
CALCIUM, CORRECTED: 9.4 mg/dL (ref 8.9–10.8)
CALCIUM: 9.4 mg/dL (ref 8.6–10.3)
CHLORIDE: 100 mmol/L (ref 98–107)
CO2 TOTAL: 24 mmol/L (ref 21–31)
CREATININE: 0.9 mg/dL (ref 0.60–1.30)
ESTIMATED GFR: 79 mL/min/{1.73_m2} (ref >59)
GLOBULIN: 3.4 (ref 2.9–5.4)
GLUCOSE: 140 mg/dL — ABNORMAL HIGH (ref 74–109)
OSMOLALITY, CALCULATED: 274 mosm/kg (ref 270–290)
POTASSIUM: 3.3 mmol/L — ABNORMAL LOW (ref 3.5–5.1)
PROTEIN TOTAL: 7.4 g/dL (ref 6.4–8.9)
SODIUM: 135 mmol/L — ABNORMAL LOW (ref 136–145)

## 2023-01-03 LAB — CBC WITH DIFF
BASOPHIL #: 0.1 10*3/uL (ref 0.00–0.10)
BASOPHIL %: 1 % (ref 0–1)
EOSINOPHIL #: 0.2 10*3/uL (ref 0.00–0.50)
EOSINOPHIL %: 3 % (ref 1–7)
HCT: 37.5 % (ref 31.2–41.9)
HGB: 12.3 g/dL (ref 10.9–14.3)
LYMPHOCYTE #: 1.4 10*3/uL (ref 1.00–3.00)
LYMPHOCYTE %: 17 % (ref 16–44)
MCH: 31.1 pg (ref 24.7–32.8)
MCHC: 32.9 g/dL (ref 32.3–35.6)
MCV: 94.4 fL (ref 75.5–95.3)
MONOCYTE #: 0.4 10*3/uL (ref 0.30–1.00)
MONOCYTE %: 5 % (ref 5–13)
MPV: 8 fL (ref 7.9–10.8)
NEUTROPHIL #: 6.1 10*3/uL (ref 1.85–7.80)
NEUTROPHIL %: 74 % (ref 43–77)
PLATELETS: 257 10*3/uL (ref 140–440)
RBC: 3.97 10*6/uL (ref 3.63–4.92)
RDW: 14.1 % (ref 12.3–17.7)
WBC: 8.1 10*3/uL (ref 3.8–11.8)

## 2023-01-03 LAB — CORTISOL, PLASMA OR SERUM: CORTISOL: 10.2 ug/dL (ref 6.7–22.6)

## 2023-01-03 LAB — THYROXINE, FREE (FREE T4): THYROXINE (T4), FREE: 1.09 ng/dL (ref 0.58–1.64)

## 2023-01-03 LAB — MAGNESIUM: MAGNESIUM: 1.6 mg/dL — ABNORMAL LOW (ref 1.9–2.7)

## 2023-01-03 MED ORDER — ALBUTEROL SULFATE 2.5 MG/3 ML (0.083 %) SOLUTION FOR NEBULIZATION
2.5000 mg | INHALATION_SOLUTION | Freq: Once | RESPIRATORY_TRACT | Status: DC | PRN
Start: 2023-01-03 — End: 2023-01-04

## 2023-01-03 MED ORDER — HYDROCORTISONE SOD SUCCINATE 100 MG/2 ML VIAL WRAPPER
100.0000 mg | Freq: Once | INTRAMUSCULAR | Status: DC | PRN
Start: 2023-01-03 — End: 2023-01-03

## 2023-01-03 MED ORDER — MEPERIDINE (PF) 25 MG/ML INJECTION SOLUTION
12.5000 mg | Freq: Once | INTRAMUSCULAR | Status: DC | PRN
Start: 2023-01-03 — End: 2023-01-04

## 2023-01-03 MED ORDER — DIPHENHYDRAMINE 50 MG/ML INJECTION SOLUTION
25.0000 mg | Freq: Once | INTRAMUSCULAR | Status: DC | PRN
Start: 2023-01-03 — End: 2023-01-04

## 2023-01-03 MED ORDER — DEXTROSE 5% IN WATER (D5W) FLUSH BAG - 250 ML
INTRAVENOUS | Status: DC | PRN
Start: 2023-01-03 — End: 2023-01-04

## 2023-01-03 MED ORDER — SODIUM CHLORIDE 0.9% FLUSH BAG - 250 ML
INTRAVENOUS | Status: DC | PRN
Start: 2023-01-03 — End: 2023-01-04

## 2023-01-03 MED ORDER — FAMOTIDINE (PF) 20 MG/2 ML INTRAVENOUS SOLUTION
20.0000 mg | Freq: Once | INTRAVENOUS | Status: DC | PRN
Start: 2023-01-03 — End: 2023-01-04

## 2023-01-03 MED ORDER — EPINEPHRINE 1 MG/ML (1 ML) INJECTION SOLUTION
0.3000 mg | Freq: Once | INTRAMUSCULAR | Status: DC | PRN
Start: 2023-01-03 — End: 2023-01-04

## 2023-01-03 MED ORDER — DOXYCYCLINE HYCLATE 100 MG CAPSULE
100.0000 mg | ORAL_CAPSULE | Freq: Two times a day (BID) | ORAL | 0 refills | Status: DC
Start: 2023-01-03 — End: 2023-03-21

## 2023-01-03 MED ORDER — ALBUTEROL SULFATE HFA 90 MCG/ACTUATION AEROSOL INHALER - RN
2.0000 | Freq: Once | RESPIRATORY_TRACT | Status: DC | PRN
Start: 2023-01-03 — End: 2023-01-04

## 2023-01-03 MED ORDER — SODIUM CHLORIDE 0.9 % INTRAVENOUS SOLUTION
200.0000 mg | Freq: Once | INTRAVENOUS | Status: AC
Start: 2023-01-03 — End: 2023-01-03
  Administered 2023-01-03: 0 mg via INTRAVENOUS
  Administered 2023-01-03: 200 mg via INTRAVENOUS
  Filled 2023-01-03: qty 8

## 2023-01-03 MED ORDER — DIPHENHYDRAMINE 50 MG/ML INJECTION SOLUTION
50.0000 mg | Freq: Once | INTRAMUSCULAR | Status: DC | PRN
Start: 2023-01-03 — End: 2023-01-04

## 2023-01-03 NOTE — Nurses Notes (Addendum)
7253- Arrived to unit ambulatory with husband at side. Seen provider in ONC clinic. Weight, VS, and labs collected at clinic. Leeanne Deed, RN   [phone]- All assessments complete. States she is feeling good. Continues to have chronic neuropathy. Constipation/diarrhea and mouth sores following Keytruda infusions. States she has dry, itchy skin with no change from previous weeks, Dr Damita Lack aware.  Right chest Portacath accessed, blood return noted, flushed with 20mL NS. Dressing applied. No complaints voiced.   Patient Assessment/Symptom Management Patient Has No MD Appointment Today   Key: (+) Symptom present           (-)  Symptom not present If Symptom is Positive(+) a Nursing Note is required   Edema -   Uncontrolled Nausea -   Vomiting -   Inability to eat/drink -   Mouth Sores after Keytruda   Diarrhea after Keytruda   Constipation (? Last BM) after Keytruda   Fatigue that interferes with ADL's +   Numbness/Tingling -change - no change   Other -   Fever/Signs & Symptoms of infection -   Nurse Initials VB   Labs resulted. Treatment plan released to pharmacy. Potassium and Magnesium levels low, patient states she spoke with Dr Damita Lack about it and wishes to not have replacement today. TSH also low, thyroid doctor manages this. Leeanne Deed, RN   1026- Keytruda 200 mg infusion started. Encouraged to call out for any changes. Leeanne Deed, RN   [phone]- Keytruda infusion complete, line flushing. Tolerated well. No complaints voiced. Leeanne Deed, RN   1110- VSS. Right chest Portacath flushed with 20mL NS, deaccessed, pressure dressing applied. Leeanne Deed, RN   [phone]- Left unit ambulatory with husband. Leeanne Deed, RN

## 2023-01-03 NOTE — Progress Notes (Unsigned)
Department of Hematology/Oncology  History and Physical    Name: Rachel Mendoza  ZOX:[id number]  Date of Birth: 02/13/1976  Encounter Date: 01/03/2023    REFERRING PROVIDER:  Noel Christmas, MD  [address]  [place],  [place] [address]    TELEMEDICINE DOCUMENTATION:  Patient Location:  [facility], [facility] outpatient Hematology/Oncology [address] [place] [address]  Patient/family aware of provider location:  yes  Patient/family consent for telemedicine:  yes    REASON FOR OFFICE VISIT:  New patient for evaluation and management of triple negative breast cancer.    HISTORY OF PRESENT ILLNESS:  Rachel Mendoza is a 46 y.o. female who presents today for initial medical oncology consultation regarding triple negative breast cancer.  The patient discovered a lump which was evaluated with mammogram, ultrasound, and biopsy.  The primary lesion measures in the range of 2.2-2.9 cm, and there were some nearby lymph nodes that were described as prominent although not obviously involved.  The narrow measurement for the lymph nodes was in the range of 1.1 cm.    The biopsy showed invasive ductal malignancy that was triple negative.  BRCA testing is currently pending.  Based on the above information, she was referred for consideration of neoadjuvant chemotherapy.    05/10/2022: The patient is here for follow up of triple negative breast cancer.  We still do not have the BRCA result.  We have also been trying to obtain a pretreatment PET-CT, but have not been able to obtain approval for it yet.    05/31/2022: The patient is here for follow up of triple negative breast cancer.  She states that she has noticed a dramatic improvement in the size of the lesion.  She states that she struggles to palpate it now.    06/21/2022: The patient is here for follow up of triple negative breast cancer.  She states that she is having fatigue and various other issues related to treatment.    09/13/2022: The patient  is here for follow up of triple negative breast cancer. She states that she feels bad for approximately 10 days after chemotherapy treatments.     10/04/2022: The patient is here for follow up of triple negative breast cancer.  She states that she has a problem with dizziness when she stands up too quickly.  Otherwise, she is doing about the same.    10/25/2022: The patient is here for follow up of triple negative breast cancer.  Since the last visit, she did meet with a breast surgeon and the plan is to proceed with a lumpectomy at the end of the month.    11/22/2022:  The patient is here for follow up of triple negative breast cancer.  Since the last visit, she had surgery and was found to have a pathologic complete response.  She is doing well from the postsurgical standpoint, but states that she is in the process of trying to pass a kidney stone    12/13/2022: The patient is here for follow up of triple negative breast cancer.  She is doing relatively well on pembrolizumab.  She states that she does have some pruritus, but it is reasonably well managed with consumption of Benadryl.    01/03/2023: The patient is here for follow up of triple negative breast cancer.  She continues to do well clinically.  She does have some continued itching and some acne issues.    ROS:   Review of Systems   Constitutional:  Negative  for appetite change, chills and fatigue.   HENT:   Negative for sore throat and trouble swallowing.    Eyes:  Negative for eye problems.   Respiratory:  Negative for cough and shortness of breath.    Cardiovascular:  Negative for chest pain and leg swelling.   Gastrointestinal:  Negative for abdominal pain.   Genitourinary:  Negative for dysuria and hematuria.    Musculoskeletal:  Negative for arthralgias and gait problem.   Skin:  Negative for rash.   Neurological:  Negative for gait problem.   Hematological:  Negative for adenopathy.   Psychiatric/Behavioral:  Negative for depression.          HISTORY:  Past Medical History:   Diagnosis Date    Allergic rhinitis     Anal fissure     Asthma     Bradycardia     CPAP (continuous positive airway pressure) dependence     Depression     Dysrhythmias     tachy after anesthesia for back surgery leading to prolonged stay    Esophageal reflux     History of anesthesia complications     hx of slow to wake    Hx of breast cancer     Hypersomnia     Hypertension     Hypothyroidism     Insomnia, unspecified     Kidney stones 06/06/2021    Nerve damage     Obesity, unspecified     Shortness of breath     Sleep apnea     Tachycardia, unspecified     Thyroid disorder     Wears glasses          Past Surgical History:   Procedure Laterality Date    HX BACK SURGERY      HX BREAST BIOPSY Right     HX HAND SURGERY Left     left thumb    HX HYSTERECTOMY      HX LITHOTRIPSY      HX TONSILLECTOMY      HX TUBAL LIGATION Bilateral     PORTACATH PLACEMENT           Social History     Socioeconomic History    Marital status: Married     Spouse name: Not on file    Number of children: Not on file    Years of education: Not on file    Highest education level: Not on file   Occupational History    Not on file   Tobacco Use    Smoking status: Every Day     Current packs/day: 0.50     Average packs/day: 0.5 packs/day for 36.7 years (18.4 ttl pk-yrs)     Types: Cigarettes     Start date: 04/08/1986     Passive exposure: Never    Smokeless tobacco: Never    Tobacco comments:     1800 quit now   Vaping Use    Vaping status: Never Used   Substance and Sexual Activity    Alcohol use: Not Currently     Comment: occasionally    Drug use: Yes     Types: Marijuana     Comment: CBD Gummies    Sexual activity: Not Currently   Other Topics Concern    Ability to Walk 1 Flight of Steps without SOB/CP No    Routine Exercise No    Ability to Walk 2 Flight of Steps without SOB/CP Not Asked    Unable to Ambulate Not  Asked    Total Care Not Asked    Ability To Do Own ADL's Not Asked    Uses Walker No     Other Activity Level No     Comment: chemo has made her very weak, almost no activity    Uses Cane No   Social History Narrative    Not on file     Social Determinants of Health     Financial Resource Strain: Not on file   Transportation Needs: Not on file   Social Connections: Not on file   Intimate Partner Violence: Not on file   Housing Stability: Not on file     Family Medical History:       Problem Relation (Age of Onset)    Asthma Brother, Maternal Grandmother    Breast Cancer Paternal Grandmother    Diabetes type II Father, Paternal Grandmother    Elevated Lipids Father    Heart Disease Father    Hypertension (High Blood Pressure) Mother, Father    No Known Problems Sister, Maternal Aunt, Maternal Uncle, Paternal Aunt, Paternal Uncle, Maternal Grandfather, Paternal Grandfather, Daughter, Son, Other            Current Outpatient Medications   Medication Sig    albuterol sulfate (PROVENTIL OR VENTOLIN OR PROAIR) 90 mcg/actuation Inhalation oral inhaler Take 1-2 Puffs by inhalation Every 6 hours as needed    beclomethasone dipropionate (QVAR REDIHALER) 80 mcg/actuation Inhalation oral inhaler Take 1 Puff by inhalation Twice daily    dicyclomine (BENTYL) 20 mg Oral Tablet Take 1 Tablet (20 mg total) by mouth Four times a day    dilTIAZem (CARDIZEM CD) 120 mg Oral Capsule, Sust. Release 24 hr Take 1 Capsule (120 mg total) by mouth Once a day    doxycycline hyclate (VIBRAMYCIN) 100 mg Oral Capsule Take 1 Capsule (100 mg total) by mouth Twice daily    gabapentin (NEURONTIN) 300 mg Oral Capsule Take 1 Capsule (300 mg total) by mouth Four times a day    hydroCHLOROthiazide (HYDRODIURIL) 25 mg Oral Tablet Take 1 Tablet (25 mg total) by mouth Once a day    HYDROcodone-acetaminophen (NORCO) 10-325 mg Oral Tablet Take 1 Tablet by mouth Every 6 hours as needed for Pain    levothyroxine (SYNTHROID) 175 mcg Oral Tablet Take 1 Tablet (175 mcg total) by mouth Every morning    LIDOCAINE VISCOUS 2 % Mucous Membrane Solution Once  per day as needed    lidocaine-prilocaine (EMLA) 2.5-2.5 % Cream APPLY TOPICALLY AS A ONE-TIME DOSE    loratadine (CLARITIN) 10 mg Oral Tablet Take 1 Tablet (10 mg total) by mouth Once a day    losartan (COZAAR) 50 mg Oral Tablet Take 1 Tablet (50 mg total) by mouth Once a day    Magic Mouthwash Swish and spit 10 mL Every 2 hours as needed for Sore throat Contains lidocaine viscous 2%, diphenhydramine 12.5 mg/5 ml, Maalox. Mix 1:1:1    magnesium Oxide 420 mg Oral Tablet Take 486 mg by mouth Twice daily    Methylprednisolone (MEDROL DOSEPACK) 4 mg Oral Tablets, Dose Pack Take as instructed.    montelukast (SINGULAIR) 10 mg Oral Tablet Take 1 Tablet (10 mg total) by mouth Once a day    nystatin (MYCOSTATIN) 100,000 unit/mL Oral Suspension Take 5 mL by mouth Four times a day    omeprazole (PRILOSEC) 20 mg Oral Capsule, Delayed Release(E.C.) Take 1 Capsule (20 mg total) by mouth Once a day    potassium chloride (K-DUR) 20  mEq Oral Tab Sust.Rel. Particle/Crystal Take 1 Tablet (20 mEq total) by mouth Once a day (Patient taking differently: Take 1 Tablet (20 mEq total) by mouth Once a day Has not been taking)    promethazine (PHENERGAN) 25 mg Oral Tablet TAKE 1 TABLET BY MOUTH EVERY 6 HOURS AS NEEDED FOR NAUSEA AND VOMITING    sertraline (ZOLOFT) 100 mg Oral Tablet Take 1 Tablet (100 mg total) by mouth Once a day     Allergies   Allergen Reactions    Dexamethasone (Pf) Swelling    Tamsulosin Nausea/ Vomiting       PHYSICAL EXAM:  Most Recent Vitals    Flowsheet Row Telemedicine from 04/24/2022 in Hematology/Oncology,   [facility]   Temperature 36.6 C (97.8 F) filed at... 04/24/2022 1338   Heart Rate 104 filed at... 04/24/2022 1338   Respiratory Rate --   BP (Non-Invasive) 147/99 filed at... 04/24/2022 1338   SpO2 96 % filed at... 04/24/2022 1338   Height 1.651 m (5\' 5" ) filed at... 04/24/2022 1338   Weight 121 kg (266 lb 6.4 oz) filed at... 04/24/2022 1338   BMI (Calculated) 44.42 filed at... 04/24/2022  1338   BSA (Calculated) 2.35 filed at... 04/24/2022 1338      ECOG Status: (0) Fully active, able to carry on all predisease performance without restriction   Physical Exam    DIAGNOSTIC DATA:  No results found for this or any previous visit (from the past 01601 hour(s)).    LABS:   CBC  Diff   Lab Results   Component Value Date/Time    WBC 8.1 01/03/2023 08:27 AM    HGB 12.3 01/03/2023 08:27 AM    HCT 37.5 01/03/2023 08:27 AM    PLTCNT 257 01/03/2023 08:27 AM    RBC 3.97 01/03/2023 08:27 AM    MCV 94.4 01/03/2023 08:27 AM    MCHC 32.9 01/03/2023 08:27 AM    MCH 31.1 01/03/2023 08:27 AM    RDW 14.1 01/03/2023 08:27 AM    MPV 8.0 01/03/2023 08:27 AM    Lab Results   Component Value Date/Time    PMNS 74 01/03/2023 08:27 AM    LYMPHOCYTES 17 01/03/2023 08:27 AM    EOSINOPHIL 3 01/03/2023 08:27 AM    MONOCYTES 5 01/03/2023 08:27 AM    BASOPHILS 1 01/03/2023 08:27 AM    BASOPHILS 0.10 01/03/2023 08:27 AM    PMNABS 6.10 01/03/2023 08:27 AM    LYMPHSABS 1.40 01/03/2023 08:27 AM    EOSABS 0.20 01/03/2023 08:27 AM    MONOSABS 0.40 01/03/2023 08:27 AM            ASSESSMENT:    ICD-10-CM    1. Malignant neoplasm of upper-outer quadrant of right breast in female, estrogen receptor negative (CMS HCC)  C50.411     Z17.1              PLAN:   1. All relevant medical records were reviewed including available pertinent provider notes, procedure notes, imaging, laboratory, and pathology.   2. All pertinent labs and/or imaging were reviewed with the patient.   3. Triple negative breast cancer:  We previously reviewed the staging information with the patient, and reviewed the NCCN guidelines.  She was initially either T2 N0 M0 or T2 N1 M0.  Either one of those would be categorized as stage II.  According to NCCN guidelines, this fell into the high-risk triple negative category. BRCA mutation status was negative.  She has completed the neoadjuvant  chemotherapy regimen for high-risk triple negative breast cancer with the exception of  pembrolizumab which will continue.  She had a pathologic complete response.  We previously discussed radiation therapy, and she declined it.  I advised her that it is a strong recommendation according to current guidelines.  We are checking Signatera testing to determine potential for recurrence, and it has been coming back 0.  I will see her back every other treatment, so I will see her in 6 weeks.    Rachel Mendoza was given the chance to ask questions, and these were answered to their satisfaction. The patient is welcome to call with any questions or concerns in the meantime.     On the day of the encounter, a total of 35 minutes was spent on this patient encounter including review of historical information, examination, documentation and post-visit activities.   Return in about 6 weeks (around 02/14/2023).     Lupita Dawn, MD  01/03/2023, 09:08  The patient was seen as part of a collaborative telemedicine service with Dr. Damita Lack who participated in the encounter by active presence via approved video/audio means for portions of the encounter.  The patient insurance company bears full legal and financial responsibility resulting from any deviations they cause to my recommended treatment plan.  CC:  [name], FNP-BC  [address]  [place] [address]    Noel Christmas, MD  [address]  [place],  [place] [address]    This note was partially generated using MModal Fluency Direct system, and there may be some incorrect words, spellings, and punctuation that were not noted in checking the note before saving.

## 2023-01-06 ENCOUNTER — Other Ambulatory Visit (RURAL_HEALTH_CENTER): Payer: Self-pay | Admitting: Family

## 2023-01-14 LAB — SURGICAL PATHOLOGY SPECIMEN

## 2023-01-19 ENCOUNTER — Encounter (HOSPITAL_COMMUNITY): Payer: Self-pay | Admitting: HEMATOLOGY-ONCOLOGY

## 2023-01-20 ENCOUNTER — Encounter (HOSPITAL_COMMUNITY): Payer: Self-pay | Admitting: HEMATOLOGY-ONCOLOGY

## 2023-01-23 ENCOUNTER — Encounter (HOSPITAL_COMMUNITY): Payer: Self-pay | Admitting: HEMATOLOGY-ONCOLOGY

## 2023-01-24 ENCOUNTER — Encounter (INDEPENDENT_AMBULATORY_CARE_PROVIDER_SITE_OTHER): Payer: Self-pay | Admitting: HEMATOLOGY-ONCOLOGY

## 2023-01-24 ENCOUNTER — Ambulatory Visit
Admission: RE | Admit: 2023-01-24 | Discharge: 2023-01-24 | Disposition: A | Discharge status: Home / Self Care | Payer: Commercial Managed Care - PPO | Source: Ambulatory Visit | Attending: HEMATOLOGY-ONCOLOGY

## 2023-01-24 ENCOUNTER — Ambulatory Visit (INDEPENDENT_AMBULATORY_CARE_PROVIDER_SITE_OTHER)
Admission: RE | Admit: 2023-01-24 | Discharge: 2023-01-24 | Disposition: A | Discharge status: Home / Self Care | Payer: Commercial Managed Care - PPO | Source: Ambulatory Visit | Attending: HEMATOLOGY-ONCOLOGY | Admitting: HEMATOLOGY-ONCOLOGY

## 2023-01-24 ENCOUNTER — Encounter (HOSPITAL_COMMUNITY): Payer: Self-pay

## 2023-01-24 ENCOUNTER — Other Ambulatory Visit (INDEPENDENT_AMBULATORY_CARE_PROVIDER_SITE_OTHER): Payer: Self-pay | Admitting: HEMATOLOGY-ONCOLOGY

## 2023-01-24 ENCOUNTER — Other Ambulatory Visit: Payer: Self-pay

## 2023-01-24 VITALS — BP 120/74 | HR 90 | Temp 97.3°F | Resp 18 | Wt 255.8 lb

## 2023-01-24 DIAGNOSIS — Z5112 Encounter for antineoplastic immunotherapy: Secondary | ICD-10-CM | POA: Insufficient documentation

## 2023-01-24 DIAGNOSIS — Z171 Estrogen receptor negative status [ER-]: Secondary | ICD-10-CM

## 2023-01-24 DIAGNOSIS — C50411 Malignant neoplasm of upper-outer quadrant of right female breast: Secondary | ICD-10-CM | POA: Insufficient documentation

## 2023-01-24 LAB — CBC WITH DIFF
BASOPHIL #: 0.1 10*3/uL (ref 0.00–0.10)
BASOPHIL %: 1 % (ref 0–1)
EOSINOPHIL #: 0.2 10*3/uL (ref 0.00–0.50)
EOSINOPHIL %: 2 % (ref 1–7)
HCT: 36.8 % (ref 31.2–41.9)
HGB: 12.6 g/dL (ref 10.9–14.3)
LYMPHOCYTE #: 1.8 10*3/uL (ref 1.00–3.00)
LYMPHOCYTE %: 19 % (ref 16–44)
MCH: 31.4 pg (ref 24.7–32.8)
MCHC: 34.2 g/dL (ref 32.3–35.6)
MCV: 91.8 fL (ref 75.5–95.3)
MONOCYTE #: 0.6 10*3/uL (ref 0.30–1.00)
MONOCYTE %: 6 % (ref 5–13)
MPV: 8 fL (ref 7.9–10.8)
NEUTROPHIL #: 7.1 10*3/uL (ref 1.85–7.80)
NEUTROPHIL %: 73 % (ref 43–77)
PLATELETS: 264 10*3/uL (ref 140–440)
RBC: 4.01 10*6/uL (ref 3.63–4.92)
RDW: 14.4 % (ref 12.3–17.7)
WBC: 9.8 10*3/uL (ref 3.8–11.8)

## 2023-01-24 LAB — COMPREHENSIVE METABOLIC PANEL, NON-FASTING
ALBUMIN/GLOBULIN RATIO: 1.2 (ref 0.8–1.4)
ALBUMIN: 4 g/dL (ref 3.5–5.7)
ALKALINE PHOSPHATASE: 119 U/L — ABNORMAL HIGH (ref 34–104)
ALT (SGPT): 32 U/L (ref 7–52)
ANION GAP: 7 mmol/L (ref 4–13)
AST (SGOT): 34 U/L (ref 13–39)
BILIRUBIN TOTAL: 0.4 mg/dL (ref 0.3–1.0)
BUN/CREA RATIO: 18 (ref 6–22)
BUN: 14 mg/dL (ref 7–25)
CALCIUM, CORRECTED: 9.3 mg/dL (ref 8.9–10.8)
CALCIUM: 9.3 mg/dL (ref 8.6–10.3)
CHLORIDE: 101 mmol/L (ref 98–107)
CO2 TOTAL: 26 mmol/L (ref 21–31)
CREATININE: 0.78 mg/dL (ref 0.60–1.30)
ESTIMATED GFR: 94 mL/min/{1.73_m2} (ref >59)
GLOBULIN: 3.4 (ref 2.9–5.4)
GLUCOSE: 97 mg/dL (ref 74–109)
OSMOLALITY, CALCULATED: 269 mosm/kg — ABNORMAL LOW (ref 270–290)
POTASSIUM: 4.1 mmol/L (ref 3.5–5.1)
PROTEIN TOTAL: 7.4 g/dL (ref 6.4–8.9)
SODIUM: 134 mmol/L — ABNORMAL LOW (ref 136–145)

## 2023-01-24 LAB — THYROXINE, FREE (FREE T4): THYROXINE (T4), FREE: 0.77 ng/dL (ref 0.58–1.64)

## 2023-01-24 LAB — MAGNESIUM: MAGNESIUM: 1.8 mg/dL — ABNORMAL LOW (ref 1.9–2.7)

## 2023-01-24 LAB — THYROID STIMULATING HORMONE WITH FREE T4 REFLEX: TSH: 19.613 u[IU]/mL — ABNORMAL HIGH (ref 0.450–5.330)

## 2023-01-24 MED ORDER — SODIUM CHLORIDE 0.9% FLUSH BAG - 250 ML
INTRAVENOUS | Status: DC | PRN
Start: 2023-01-24 — End: 2023-01-25

## 2023-01-24 MED ORDER — ALBUTEROL SULFATE HFA 90 MCG/ACTUATION AEROSOL INHALER - RN
2.0000 | Freq: Once | RESPIRATORY_TRACT | Status: DC | PRN
Start: 2023-01-24 — End: 2023-01-25

## 2023-01-24 MED ORDER — FAMOTIDINE (PF) 20 MG/2 ML INTRAVENOUS SOLUTION
20.0000 mg | Freq: Once | INTRAVENOUS | Status: DC | PRN
Start: 2023-01-24 — End: 2023-01-25

## 2023-01-24 MED ORDER — HYDROCORTISONE SOD SUCCINATE 100 MG/2 ML VIAL WRAPPER
100.0000 mg | Freq: Once | INTRAMUSCULAR | Status: DC | PRN
Start: 2023-01-24 — End: 2023-01-24

## 2023-01-24 MED ORDER — MEPERIDINE (PF) 25 MG/ML INJECTION SOLUTION
12.5000 mg | Freq: Once | INTRAMUSCULAR | Status: DC | PRN
Start: 2023-01-24 — End: 2023-01-25

## 2023-01-24 MED ORDER — ALBUTEROL SULFATE 2.5 MG/3 ML (0.083 %) SOLUTION FOR NEBULIZATION
2.5000 mg | INHALATION_SOLUTION | Freq: Once | RESPIRATORY_TRACT | Status: DC | PRN
Start: 2023-01-24 — End: 2023-01-25

## 2023-01-24 MED ORDER — DEXTROSE 5% IN WATER (D5W) FLUSH BAG - 250 ML
INTRAVENOUS | Status: DC | PRN
Start: 2023-01-24 — End: 2023-01-25

## 2023-01-24 MED ORDER — EPINEPHRINE 1 MG/ML (1 ML) INJECTION SOLUTION
0.3000 mg | Freq: Once | INTRAMUSCULAR | Status: DC | PRN
Start: 2023-01-24 — End: 2023-01-25

## 2023-01-24 MED ORDER — DIPHENHYDRAMINE 50 MG/ML INJECTION SOLUTION
50.0000 mg | Freq: Once | INTRAMUSCULAR | Status: DC | PRN
Start: 2023-01-24 — End: 2023-01-25

## 2023-01-24 MED ORDER — SODIUM CHLORIDE 0.9 % INTRAVENOUS SOLUTION
200.0000 mg | Freq: Once | INTRAVENOUS | Status: AC
Start: 2023-01-24 — End: 2023-01-24
  Administered 2023-01-24: 0 mg via INTRAVENOUS
  Administered 2023-01-24: 200 mg via INTRAVENOUS
  Filled 2023-01-24: qty 8

## 2023-01-24 MED ORDER — DIPHENHYDRAMINE 50 MG/ML INJECTION SOLUTION
25.0000 mg | Freq: Once | INTRAMUSCULAR | Status: DC | PRN
Start: 2023-01-24 — End: 2023-01-25

## 2023-01-24 NOTE — Nursing Note (Signed)
Signatera mailed.

## 2023-01-24 NOTE — Nursing Note (Signed)
Secure chat sent from Rachel Mendoza in infusion to review distress tool screening.  I called Rachel Mendoza to discuss distress tool while in infusion.  PHQ-9 and GAD-7 completed, Rachel Mendoza states that she has disconnected with everyone except her immediate family, grandkids, father, mother, and husband.  Rachel Mendoza states she mostly stays at home and is not working at this time and is applying for social security disability.  She previously saw counselors at the [facility] but at this time she is following up with her PCP, Rachel Mendoza, for depression and anxiety.  She states she missed her last appointment with PCP, I encouraged Rachel Mendoza to call their office for follow up appointment. She states her PCP is also planning on leaving and the other APP at the office is a female and she would rather see a female.  Infusion nurse states that [facility] is accepting new patients and has a new female APP.  Written information, including phone number and address for [facility] sent to the infusion center for infusion nurse to give to Rachel Mendoza.  Rachel Mendoza states she has a history of anxiety and depression and has been suicidal in the past but states she is not suicidal at this time.  She states her depression and anxiety is not any worse at this time.  She states she takes 100 mg of Zoloft daily.  She states she takes gabapentin for neuropathy but that it also helps alleviate some of her depression and anxiety by working with the Zoloft.  Rachel Mendoza states that she has a medical card for [facility] gummies and per Rachel Mendoza this has helped with her "appetite, fatigue, pain, anxiety and well-being." Encouraged Rachel Mendoza to call for any further questions or concerns, Rachel Mendoza verbalized understanding.  Rachel Mendoza requested handicapp tag, form completed and given to [facility] to have Dr. Damita Lack sign.  Kennyth Arnold will scan into media and send to the infusion nurse to give to Rachel Mendoza.

## 2023-01-24 NOTE — Nurses Notes (Signed)
6213 - Patient ambulatory to room for Keytruda infusion. Assessment complete. Lungs clear throughout lung fields. Abdomen soft and non-tender, bowel sounds present. No edema noted. Patient complains of chronic pain related to neuropathy. Carolynn Serve, RN  Patient Assessment/Symptom Management Patient Has No MD Appointment Today   Key: (+) Symptom present           (-)  Symptom not present If Symptom is Positive(+) a Nursing Note is required   Edema -   Uncontrolled Nausea -   Vomiting -   Inability to eat/drink -   Mouth Sores -   Diarrhea -   Constipation (? Last BM) -   Fatigue that interferes with ADL's -   Numbness/Tingling -change -   Other -   Fever/Signs & Symptoms of infection -   Nurse Initials JD   0935 - PAC accessed and excellent blood return noted. Carolynn Serve, RN  [phone] - Labs reviewed and are good for treatment. Orders released to pharmacy. Carolynn Serve, RN  [phone] - Keytruda infusion started. Carolynn Serve, RN  [phone] - Keytruda infusion complete. Line flushing. Carolynn Serve, RN  1105 - PAC flushed with 30ml NS and deaccessed. Gauze dressing and adhesive bandage applied. Carolynn Serve, RN  [phone] - Patient left unit via ambulation at this time. Carolynn Serve, RN

## 2023-01-30 ENCOUNTER — Encounter (RURAL_HEALTH_CENTER): Payer: Self-pay | Admitting: Family

## 2023-01-31 ENCOUNTER — Telehealth (RURAL_HEALTH_CENTER): Payer: Self-pay | Admitting: Family

## 2023-01-31 ENCOUNTER — Encounter (HOSPITAL_COMMUNITY): Payer: Self-pay | Admitting: HEMATOLOGY-ONCOLOGY

## 2023-01-31 MED ORDER — LEVOTHYROXINE 100 MCG TABLET
100.0000 ug | ORAL_TABLET | Freq: Every morning | ORAL | 1 refills | Status: AC
Start: 2023-01-31 — End: ?

## 2023-01-31 MED ORDER — LEVOTHYROXINE 88 MCG TABLET
88.0000 ug | ORAL_TABLET | Freq: Every morning | ORAL | 1 refills | Status: AC
Start: 2023-01-31 — End: ?

## 2023-01-31 NOTE — Telephone Encounter (Signed)
-----   Message from [name] sent at 01/30/2023  4:37 PM EDT -----  I have not seen Rachel Mendoza in a while.  Please confirm her current dose of levothyroxine and I will adjust.  Thanks!

## 2023-01-31 NOTE — Telephone Encounter (Signed)
Levothyroxine increased to a total of 188 mcg daily.  She will take 2 tablets to get this dosage: 100 mcg +88 mcg.  She will need her TSH rechecked in 8 weeks.

## 2023-01-31 NOTE — Telephone Encounter (Signed)
 Patient was notified of medication changes .

## 2023-01-31 NOTE — Telephone Encounter (Signed)
Patient said that she was doing the 175 mcg levothyroxine.

## 2023-02-04 ENCOUNTER — Encounter (HOSPITAL_COMMUNITY): Payer: Self-pay | Admitting: HEMATOLOGY-ONCOLOGY

## 2023-02-04 LAB — SIGNATERA
SIGNATERA MTM READOUT: 0 MTM/ml
SIGNATERA TEST RESULT: NEGATIVE

## 2023-02-11 ENCOUNTER — Other Ambulatory Visit (INDEPENDENT_AMBULATORY_CARE_PROVIDER_SITE_OTHER): Payer: Self-pay | Admitting: HEMATOLOGY-ONCOLOGY

## 2023-02-13 ENCOUNTER — Encounter (HOSPITAL_COMMUNITY): Payer: Self-pay | Admitting: HEMATOLOGY-ONCOLOGY

## 2023-02-14 ENCOUNTER — Ambulatory Visit
Admission: RE | Admit: 2023-02-14 | Discharge: 2023-02-14 | Disposition: A | Discharge status: Home / Self Care | Payer: PRIVATE HEALTH INSURANCE | Source: Ambulatory Visit | Attending: HEMATOLOGY-ONCOLOGY | Admitting: HEMATOLOGY-ONCOLOGY

## 2023-02-14 ENCOUNTER — Encounter (HOSPITAL_COMMUNITY): Payer: Self-pay

## 2023-02-14 ENCOUNTER — Encounter (INDEPENDENT_AMBULATORY_CARE_PROVIDER_SITE_OTHER): Payer: Self-pay | Admitting: HEMATOLOGY-ONCOLOGY

## 2023-02-14 ENCOUNTER — Ambulatory Visit (INDEPENDENT_AMBULATORY_CARE_PROVIDER_SITE_OTHER)
Admission: RE | Admit: 2023-02-14 | Discharge: 2023-02-14 | Disposition: A | Discharge status: Home / Self Care | Payer: PRIVATE HEALTH INSURANCE | Source: Ambulatory Visit | Attending: HEMATOLOGY-ONCOLOGY | Admitting: HEMATOLOGY-ONCOLOGY

## 2023-02-14 ENCOUNTER — Other Ambulatory Visit: Payer: Self-pay

## 2023-02-14 ENCOUNTER — Ambulatory Visit (HOSPITAL_BASED_OUTPATIENT_CLINIC_OR_DEPARTMENT_OTHER): Payer: PRIVATE HEALTH INSURANCE | Admitting: HEMATOLOGY-ONCOLOGY

## 2023-02-14 ENCOUNTER — Telehealth (INDEPENDENT_AMBULATORY_CARE_PROVIDER_SITE_OTHER): Payer: Self-pay | Admitting: HEMATOLOGY-ONCOLOGY

## 2023-02-14 VITALS — BP 130/90 | HR 104 | Temp 96.5°F | Ht 65.0 in | Wt 256.4 lb

## 2023-02-14 VITALS — BP 119/81 | HR 79 | Temp 97.4°F | Resp 18

## 2023-02-14 DIAGNOSIS — Z171 Estrogen receptor negative status [ER-]: Secondary | ICD-10-CM

## 2023-02-14 DIAGNOSIS — Z79620 Long term (current) use of immunosuppressive biologic: Secondary | ICD-10-CM | POA: Insufficient documentation

## 2023-02-14 DIAGNOSIS — C50411 Malignant neoplasm of upper-outer quadrant of right female breast: Secondary | ICD-10-CM | POA: Insufficient documentation

## 2023-02-14 DIAGNOSIS — Z17421 Hormone receptor negative with human epidermal growth factor receptor 2 negative status: Secondary | ICD-10-CM | POA: Insufficient documentation

## 2023-02-14 DIAGNOSIS — Z5112 Encounter for antineoplastic immunotherapy: Secondary | ICD-10-CM | POA: Insufficient documentation

## 2023-02-14 LAB — COMPREHENSIVE METABOLIC PANEL, NON-FASTING
ALBUMIN/GLOBULIN RATIO: 1.2 (ref 0.8–1.4)
ALBUMIN: 3.9 g/dL (ref 3.5–5.7)
ALKALINE PHOSPHATASE: 103 U/L (ref 34–104)
ALT (SGPT): 27 U/L (ref 7–52)
ANION GAP: 6 mmol/L (ref 4–13)
AST (SGOT): 22 U/L (ref 13–39)
BILIRUBIN TOTAL: 0.3 mg/dL (ref 0.3–1.0)
BUN/CREA RATIO: 16 (ref 6–22)
BUN: 15 mg/dL (ref 7–25)
CALCIUM, CORRECTED: 9.7 mg/dL (ref 8.9–10.8)
CALCIUM: 9.6 mg/dL (ref 8.6–10.3)
CHLORIDE: 104 mmol/L (ref 98–107)
CO2 TOTAL: 28 mmol/L (ref 21–31)
CREATININE: 0.92 mg/dL (ref 0.60–1.30)
ESTIMATED GFR: 77 mL/min/{1.73_m2} (ref >59)
GLOBULIN: 3.2 (ref 2.0–3.5)
GLUCOSE: 119 mg/dL — ABNORMAL HIGH (ref 74–109)
OSMOLALITY, CALCULATED: 278 mosm/kg (ref 270–290)
POTASSIUM: 3.4 mmol/L — ABNORMAL LOW (ref 3.5–5.1)
PROTEIN TOTAL: 7.1 g/dL (ref 6.4–8.9)
SODIUM: 138 mmol/L (ref 136–145)

## 2023-02-14 LAB — THYROXINE, FREE (FREE T4): THYROXINE (T4), FREE: 1.22 ng/dL — ABNORMAL HIGH (ref 0.61–1.12)

## 2023-02-14 LAB — CBC WITH DIFF
BASOPHIL #: 0.1 10*3/uL (ref 0.00–0.10)
BASOPHIL %: 1 % (ref 0–1)
EOSINOPHIL #: 0.2 10*3/uL (ref 0.00–0.50)
EOSINOPHIL %: 3 % (ref 1–7)
HCT: 36.5 % (ref 31.2–41.9)
HGB: 12.5 g/dL (ref 10.9–14.3)
LYMPHOCYTE #: 1.6 10*3/uL (ref 1.00–3.00)
LYMPHOCYTE %: 24 % (ref 16–44)
MCH: 30.4 pg (ref 24.7–32.8)
MCHC: 34.2 g/dL (ref 32.3–35.6)
MCV: 89.1 fL (ref 75.5–95.3)
MONOCYTE #: 0.4 10*3/uL (ref 0.30–1.00)
MONOCYTE %: 7 % (ref 5–13)
MPV: 7.8 fL — ABNORMAL LOW (ref 7.9–10.8)
NEUTROPHIL #: 4.4 10*3/uL (ref 1.85–7.80)
NEUTROPHIL %: 65 % (ref 43–77)
PLATELETS: 255 10*3/uL (ref 140–440)
RBC: 4.1 10*6/uL (ref 3.63–4.92)
RDW: 14.2 % (ref 12.3–17.7)
WBC: 6.7 10*3/uL (ref 3.8–11.8)

## 2023-02-14 LAB — THYROID STIMULATING HORMONE WITH FREE T4 REFLEX: TSH: 0.092 u[IU]/mL — ABNORMAL LOW (ref 0.450–5.330)

## 2023-02-14 LAB — MAGNESIUM: MAGNESIUM: 1.7 mg/dL — ABNORMAL LOW (ref 1.9–2.7)

## 2023-02-14 MED ORDER — ALBUTEROL SULFATE 2.5 MG/3 ML (0.083 %) SOLUTION FOR NEBULIZATION
2.5000 mg | INHALATION_SOLUTION | Freq: Once | RESPIRATORY_TRACT | Status: DC | PRN
Start: 2023-02-14 — End: 2023-02-15

## 2023-02-14 MED ORDER — MEPERIDINE (PF) 25 MG/ML INJECTION SOLUTION
12.5000 mg | Freq: Once | INTRAMUSCULAR | Status: DC | PRN
Start: 2023-02-14 — End: 2023-02-15

## 2023-02-14 MED ORDER — FAMOTIDINE (PF) 20 MG/2 ML INTRAVENOUS SOLUTION
20.0000 mg | Freq: Once | INTRAVENOUS | Status: DC | PRN
Start: 2023-02-14 — End: 2023-02-15

## 2023-02-14 MED ORDER — EPINEPHRINE 1 MG/ML (1 ML) INJECTION SOLUTION
0.3000 mg | Freq: Once | INTRAMUSCULAR | Status: DC | PRN
Start: 2023-02-14 — End: 2023-02-15

## 2023-02-14 MED ORDER — DEXTROSE 5% IN WATER (D5W) FLUSH BAG - 250 ML
INTRAVENOUS | Status: DC | PRN
Start: 2023-02-14 — End: 2023-02-15

## 2023-02-14 MED ORDER — SODIUM CHLORIDE 0.9% FLUSH BAG - 250 ML
INTRAVENOUS | Status: DC | PRN
Start: 2023-02-14 — End: 2023-02-15

## 2023-02-14 MED ORDER — SODIUM CHLORIDE 0.9 % INTRAVENOUS SOLUTION
200.0000 mg | Freq: Once | INTRAVENOUS | Status: AC
Start: 2023-02-14 — End: 2023-02-14
  Administered 2023-02-14: 200 mg via INTRAVENOUS
  Administered 2023-02-14: 0 mg via INTRAVENOUS
  Filled 2023-02-14: qty 8

## 2023-02-14 MED ORDER — MAGNESIUM SULFATE 1 GRAM/100 ML IN DEXTROSE 5 % INTRAVENOUS PIGGYBACK
INJECTION | INTRAVENOUS | Status: AC
Start: 2023-02-14 — End: 2023-02-14
  Filled 2023-02-14: qty 200

## 2023-02-14 MED ORDER — HYDROCORTISONE SOD SUCCINATE 100 MG/2 ML VIAL WRAPPER
100.0000 mg | Freq: Once | INTRAMUSCULAR | Status: DC | PRN
Start: 2023-02-14 — End: 2023-02-15

## 2023-02-14 MED ORDER — DIPHENHYDRAMINE 50 MG/ML INJECTION SOLUTION
25.0000 mg | Freq: Once | INTRAMUSCULAR | Status: DC | PRN
Start: 2023-02-14 — End: 2023-02-15

## 2023-02-14 MED ORDER — DIPHENHYDRAMINE 50 MG/ML INJECTION SOLUTION
50.0000 mg | Freq: Once | INTRAMUSCULAR | Status: DC | PRN
Start: 2023-02-14 — End: 2023-02-15

## 2023-02-14 MED ORDER — MAGNESIUM SULFATE 1 GRAM/100 ML IN DEXTROSE 5 % INTRAVENOUS PIGGYBACK
1.0000 g | INJECTION | INTRAVENOUS | Status: AC
Start: 2023-02-14 — End: 2023-02-14
  Administered 2023-02-14: 0 g via INTRAVENOUS
  Administered 2023-02-14: 1 g via INTRAVENOUS
  Administered 2023-02-14: 0 g via INTRAVENOUS
  Administered 2023-02-14: 1 g via INTRAVENOUS

## 2023-02-14 MED ORDER — ALBUTEROL SULFATE HFA 90 MCG/ACTUATION AEROSOL INHALER - RN
2.0000 | Freq: Once | RESPIRATORY_TRACT | Status: DC | PRN
Start: 2023-02-14 — End: 2023-02-15

## 2023-02-14 NOTE — Progress Notes (Unsigned)
Department of Hematology/Oncology  History and Physical    Name: Rachel Mendoza  VQQ:[id number]  Date of Birth: [birth year]  Encounter Date: 02/14/2023    REFERRING PROVIDER:  Noel Christmas, MD  [address]  [place],  [place] [address]    TELEMEDICINE DOCUMENTATION:  Patient Location:  [facility] outpatient Hematology/Oncology [address], [place] [address]  Patient/family aware of provider location:  yes  Patient/family consent for telemedicine:  yes    REASON FOR OFFICE VISIT:  New patient for evaluation and management of triple negative breast cancer.    HISTORY OF PRESENT ILLNESS:  Rachel Mendoza is a 47 y.o. female who presents today for initial medical oncology consultation regarding triple negative breast cancer.  The patient discovered a lump which was evaluated with mammogram, ultrasound, and biopsy.  The primary lesion measures in the range of 2.2-2.9 cm, and there were some nearby lymph nodes that were described as prominent although not obviously involved.  The narrow measurement for the lymph nodes was in the range of 1.1 cm.    The biopsy showed invasive ductal malignancy that was triple negative.  BRCA testing is currently pending.  Based on the above information, she was referred for consideration of neoadjuvant chemotherapy.    05/10/2022: The patient is here for follow up of triple negative breast cancer.  We still do not have the BRCA result.  We have also been trying to obtain a pretreatment PET-CT, but have not been able to obtain approval for it yet.    05/31/2022: The patient is here for follow up of triple negative breast cancer.  She states that she has noticed a dramatic improvement in the size of the lesion.  She states that she struggles to palpate it now.    06/21/2022: The patient is here for follow up of triple negative breast cancer.  She states that she is having fatigue and various other issues related to treatment.    09/13/2022: The patient  is here for follow up of triple negative breast cancer. She states that she feels bad for approximately 10 days after chemotherapy treatments.     10/04/2022: The patient is here for follow up of triple negative breast cancer.  She states that she has a problem with dizziness when she stands up too quickly.  Otherwise, she is doing about the same.    10/25/2022: The patient is here for follow up of triple negative breast cancer.  Since the last visit, she did meet with a breast surgeon and the plan is to proceed with a lumpectomy at the end of the month.    11/22/2022:  The patient is here for follow up of triple negative breast cancer.  Since the last visit, she had surgery and was found to have a pathologic complete response.  She is doing well from the postsurgical standpoint, but states that she is in the process of trying to pass a kidney stone    12/13/2022: The patient is here for follow up of triple negative breast cancer.  She is doing relatively well on pembrolizumab.  She states that she does have some pruritus, but it is reasonably well managed with consumption of Benadryl.    01/03/2023: The patient is here for follow up of triple negative breast cancer.  She continues to do well clinically.  She does have some continued itching and some acne issues.    02/14/2023: The patient is here for follow up of triple negative  breast cancer.  She denies any new problems since the last visit.    ROS:   Review of Systems   Constitutional:  Negative for appetite change, chills and fatigue.   HENT:   Negative for sore throat and trouble swallowing.    Eyes:  Negative for eye problems.   Respiratory:  Negative for cough and shortness of breath.    Cardiovascular:  Negative for chest pain and leg swelling.   Gastrointestinal:  Negative for abdominal pain.   Genitourinary:  Negative for dysuria and hematuria.    Musculoskeletal:  Negative for arthralgias and gait problem.   Skin:  Negative for rash.   Neurological:   Negative for gait problem.   Hematological:  Negative for adenopathy.   Psychiatric/Behavioral:  Negative for depression.         HISTORY:  Past Medical History:   Diagnosis Date    Allergic rhinitis     Anal fissure     Asthma     Bradycardia     CPAP (continuous positive airway pressure) dependence     Depression     Dysrhythmias     tachy after anesthesia for back surgery leading to prolonged stay    Esophageal reflux     History of anesthesia complications     hx of slow to wake    Hx of breast cancer     Hypersomnia     Hypertension     Hypothyroidism     Insomnia, unspecified     Kidney stones 06/06/2021    Nerve damage     Obesity, unspecified     Shortness of breath     Sleep apnea     Tachycardia, unspecified     Thyroid disorder     Wears glasses          Past Surgical History:   Procedure Laterality Date    HX BACK SURGERY      HX BREAST BIOPSY Right     HX HAND SURGERY Left     left thumb    HX HYSTERECTOMY      HX LITHOTRIPSY      HX TONSILLECTOMY      HX TUBAL LIGATION Bilateral     PORTACATH PLACEMENT           Social History     Socioeconomic History    Marital status: Married     Spouse name: Not on file    Number of children: Not on file    Years of education: Not on file    Highest education level: Not on file   Occupational History    Not on file   Tobacco Use    Smoking status: Every Day     Current packs/day: 0.50     Average packs/day: 0.5 packs/day for 36.9 years (18.4 ttl pk-yrs)     Types: Cigarettes     Start date: 04/08/1986     Passive exposure: Never    Smokeless tobacco: Never    Tobacco comments:     1800 quit now   Vaping Use    Vaping status: Never Used   Substance and Sexual Activity    Alcohol use: Not Currently     Comment: occasionally    Drug use: Yes     Types: Marijuana     Comment: CBD Gummies    Sexual activity: Not Currently   Other Topics Concern    Ability to Walk 1 Flight of Steps without SOB/CP No  Routine Exercise No    Ability to Walk 2 Flight of Steps without SOB/CP  Not Asked    Unable to Ambulate Not Asked    Total Care Not Asked    Ability To Do Own ADL's Not Asked    Uses Walker No    Other Activity Level No     Comment: chemo has made her very weak, almost no activity    Uses Cane No   Social History Narrative    Not on file     Social Determinants of Health     Financial Resource Strain: Not on file   Transportation Needs: Not on file   Social Connections: Not on file   Intimate Partner Violence: Not on file   Housing Stability: Not on file     Family Medical History:       Problem Relation (Age of Onset)    Asthma Brother, Maternal Grandmother    Breast Cancer Paternal Grandmother    Diabetes type II Father, Paternal Grandmother    Elevated Lipids Father    Heart Disease Father    Hypertension (High Blood Pressure) Mother, Father    No Known Problems Sister, Maternal Aunt, Maternal Uncle, Paternal Aunt, Paternal Uncle, Maternal Grandfather, Paternal Grandfather, Daughter, Son, Other            Current Outpatient Medications   Medication Sig    albuterol sulfate (PROVENTIL OR VENTOLIN OR PROAIR) 90 mcg/actuation Inhalation oral inhaler Take 1-2 Puffs by inhalation Every 6 hours as needed    beclomethasone dipropionate (QVAR REDIHALER) 80 mcg/actuation Inhalation oral inhaler Take 1 Puff by inhalation Twice daily    dicyclomine (BENTYL) 20 mg Oral Tablet Take 1 Tablet (20 mg total) by mouth Four times a day    dilTIAZem (CARDIZEM CD) 120 mg Oral Capsule, Sust. Release 24 hr Take 1 Capsule (120 mg total) by mouth Once a day    doxycycline hyclate (VIBRAMYCIN) 100 mg Oral Capsule Take 1 Capsule (100 mg total) by mouth Twice daily    gabapentin (NEURONTIN) 300 mg Oral Capsule Take 1 Capsule (300 mg total) by mouth Four times a day    hydroCHLOROthiazide (HYDRODIURIL) 25 mg Oral Tablet Take 1 Tablet (25 mg total) by mouth Once a day    HYDROcodone-acetaminophen (NORCO) 10-325 mg Oral Tablet Take 1 Tablet by mouth Every 6 hours as needed for Pain    levothyroxine (SYNTHROID) 100  mcg Oral Tablet Take 1 Tablet (100 mcg total) by mouth Every morning    levothyroxine (SYNTHROID) 88 mcg Oral Tablet Take 1 Tablet (88 mcg total) by mouth Every morning    LIDOCAINE VISCOUS 2 % Mucous Membrane Solution Once per day as needed    lidocaine-prilocaine (EMLA) 2.5-2.5 % Cream APPLY TOPICALLY AS A ONE-TIME DOSE    loratadine (CLARITIN) 10 mg Oral Tablet Take 1 Tablet (10 mg total) by mouth Once a day    losartan (COZAAR) 50 mg Oral Tablet Take 1 Tablet (50 mg total) by mouth Once a day    Magic Mouthwash Swish and spit 10 mL Every 2 hours as needed for Sore throat Contains lidocaine viscous 2%, diphenhydramine 12.5 mg/5 ml, Maalox. Mix 1:1:1    magnesium Oxide 420 mg Oral Tablet Take 486 mg by mouth Twice daily    Methylprednisolone (MEDROL DOSEPACK) 4 mg Oral Tablets, Dose Pack Take as instructed.    montelukast (SINGULAIR) 10 mg Oral Tablet Take 1 Tablet (10 mg total) by mouth Once a day    nystatin (  MYCOSTATIN) 100,000 unit/mL Oral Suspension Take 5 mL by mouth Four times a day    omeprazole (PRILOSEC) 20 mg Oral Capsule, Delayed Release(E.C.) Take 1 Capsule (20 mg total) by mouth Once a day    potassium chloride (K-DUR) 20 mEq Oral Tab Sust.Rel. Particle/Crystal Take 1 Tablet (20 mEq total) by mouth Once a day (Patient taking differently: Take 1 Tablet (20 mEq total) by mouth Once a day Has not been taking)    promethazine (PHENERGAN) 25 mg Oral Tablet TAKE 1 TABLET BY MOUTH EVERY 6 HOURS AS NEEDED FOR NAUSEA AND VOMITING    sertraline (ZOLOFT) 100 mg Oral Tablet Take 1 tablet by mouth once daily     Allergies   Allergen Reactions    Dexamethasone (Pf) Swelling    Tamsulosin Nausea/ Vomiting       PHYSICAL EXAM:  Most Recent Vitals    Flowsheet Row Telemedicine from 04/24/2022 in Hematology/Oncology,   [facility]   Temperature 36.6 C (97.8 F) filed at... 04/24/2022 1338   Heart Rate 104 filed at... 04/24/2022 1338   Respiratory Rate --   BP (Non-Invasive) 147/99 filed at...  04/24/2022 1338   SpO2 96 % filed at... 04/24/2022 1338   Height 1.651 m (5\' 5" ) filed at... 04/24/2022 1338   Weight 121 kg (266 lb 6.4 oz) filed at... 04/24/2022 1338   BMI (Calculated) 44.42 filed at... 04/24/2022 1338   BSA (Calculated) 2.35 filed at... 04/24/2022 1338      ECOG Status: (0) Fully active, able to carry on all predisease performance without restriction   Physical Exam    DIAGNOSTIC DATA:  No results found for this or any previous visit (from the past 64332 hour(s)).    LABS:   CBC  Diff   Lab Results   Component Value Date/Time    WBC 6.7 02/14/2023 09:36 AM    HGB 12.5 02/14/2023 09:36 AM    HCT 36.5 02/14/2023 09:36 AM    PLTCNT 255 02/14/2023 09:36 AM    RBC 4.10 02/14/2023 09:36 AM    MCV 89.1 02/14/2023 09:36 AM    MCHC 34.2 02/14/2023 09:36 AM    MCH 30.4 02/14/2023 09:36 AM    RDW 14.2 02/14/2023 09:36 AM    MPV 7.8 (L) 02/14/2023 09:36 AM    Lab Results   Component Value Date/Time    PMNS 65 02/14/2023 09:36 AM    LYMPHOCYTES 24 02/14/2023 09:36 AM    EOSINOPHIL 3 02/14/2023 09:36 AM    MONOCYTES 7 02/14/2023 09:36 AM    BASOPHILS 1 02/14/2023 09:36 AM    BASOPHILS 0.10 02/14/2023 09:36 AM    PMNABS 4.40 02/14/2023 09:36 AM    LYMPHSABS 1.60 02/14/2023 09:36 AM    EOSABS 0.20 02/14/2023 09:36 AM    MONOSABS 0.40 02/14/2023 09:36 AM            ASSESSMENT:    ICD-10-CM    1. Malignant neoplasm of upper-outer quadrant of right breast in female, estrogen receptor negative (CMS HCC)  C50.411     Z17.1              PLAN:   1. All relevant medical records were reviewed including available pertinent provider notes, procedure notes, imaging, laboratory, and pathology.   2. All pertinent labs and/or imaging were reviewed with the patient.   3. Triple negative breast cancer: She was initially either T2 N0 M0 or T2 N1 M0.  Either one of those would be categorized as stage II.  She has completed the neoadjuvant chemotherapy regimen for high-risk triple negative breast cancer with the exception of  pembrolizumab which will continue until January.  She had a pathologic complete response.  We previously discussed radiation therapy, and she declined it. We are checking Signatera testing to determine potential for recurrence, and it has been coming back 0.  I will see her back every other treatment, so I will see her in 6 weeks.    Rachel Mendoza was given the chance to ask questions, and these were answered to their satisfaction. The patient is welcome to call with any questions or concerns in the meantime.     On the day of the encounter, a total of 35 minutes was spent on this patient encounter including review of historical information, examination, documentation and post-visit activities.   Return in about 6 weeks (around 03/28/2023).     Rachel Dawn, MD  02/14/2023, 10:47  The patient was seen as part of a collaborative telemedicine service with Dr. Damita Lack who participated in the encounter by active presence via approved video/audio means for portions of the encounter.  The patient insurance company bears full legal and financial responsibility resulting from any deviations they cause to my recommended treatment plan.  CC:  [name], FNP-BC  [address]  [place] [address]    Noel Christmas, MD  [address]  [place],  [place] [address]    This note was partially generated using MModal Fluency Direct system, and there may be some incorrect words, spellings, and punctuation that were not noted in checking the note before saving.

## 2023-02-14 NOTE — Telephone Encounter (Signed)
Patient states that she fell couple months ago is still hurting. Neuropathy is the same. Has itchy places and acne. Patient states that other than that she feels better. Patient only has a couple more [place] to take [place]). Went over labs. Continue current treatment. Signatera test neg. Treatment today. All questions answered.

## 2023-02-14 NOTE — Nurses Notes (Addendum)
1104 patient arrived to floor ambulatory, accompanied by husband. Patient seen by [facility] ONC clinic prior to today's admission. Patients VS, weight, and height obtained by clinic. Patient here for chemotherapy infusion. Mauri Brooklyn, RN  [phone] assessment completed. Patient states she is tolerating tx well. No edema  noted. Patient states she is having pain 4/10. Dr. Damita Lack aware per patient. Mauri Brooklyn, RN  Patient Assessment/Symptom Management Patient Has No MD Appointment Today   Key: (+) Symptom present           (-)  Symptom not present If Symptom is Positive(+) a Nursing Note is required   Edema -   Uncontrolled Nausea -   Vomiting -   Inability to eat/drink -   Mouth Sores -   Diarrhea -   Constipation (? Last BM) -   Fatigue that interferes with ADL's -   Numbness/Tingling -change -   Other -   Fever/Signs & Symptoms of infection -   Nurse Initials Eb      Patient denies symptoms at th is time. Mauri Brooklyn, RN    1115 right chest port accessed. Blood return present. Mauri Brooklyn, RN  1116 orders released to pharmacy. Mag 1.7. Mauri Brooklyn, RN  [phone] keytruda infusion started. Mauri Brooklyn, RN  1207 Rande Lawman infuion completed. Patient tolerated well. Mauri Brooklyn, RN  1221 magnesium infusion started. Total of 2 grams. Mauri Brooklyn, RN  1318 magnesium infusion completed. Total 2 grams. Mauri Brooklyn, RN  1325 right chest port deaccessed. Pressure dressing applied. Mauri Brooklyn, RN  1330 patient left floor ambulatory accompanied by husband. Mauri Brooklyn, RN

## 2023-02-18 ENCOUNTER — Encounter (HOSPITAL_COMMUNITY): Payer: Self-pay | Admitting: HEMATOLOGY-ONCOLOGY

## 2023-02-20 ENCOUNTER — Other Ambulatory Visit (RURAL_HEALTH_CENTER): Payer: Self-pay | Admitting: Family

## 2023-02-25 ENCOUNTER — Encounter (HOSPITAL_COMMUNITY): Payer: Self-pay | Admitting: HEMATOLOGY-ONCOLOGY

## 2023-03-03 ENCOUNTER — Other Ambulatory Visit (INDEPENDENT_AMBULATORY_CARE_PROVIDER_SITE_OTHER): Payer: Self-pay | Admitting: HEMATOLOGY-ONCOLOGY

## 2023-03-07 ENCOUNTER — Ambulatory Visit (HOSPITAL_COMMUNITY): Payer: Commercial Managed Care - PPO

## 2023-03-19 ENCOUNTER — Encounter (HOSPITAL_COMMUNITY): Payer: Self-pay | Admitting: HEMATOLOGY-ONCOLOGY

## 2023-03-20 ENCOUNTER — Encounter (HOSPITAL_COMMUNITY): Payer: Self-pay | Admitting: HEMATOLOGY-ONCOLOGY

## 2023-03-20 ENCOUNTER — Other Ambulatory Visit (INDEPENDENT_AMBULATORY_CARE_PROVIDER_SITE_OTHER): Payer: Self-pay | Admitting: HEMATOLOGY-ONCOLOGY

## 2023-03-20 ENCOUNTER — Other Ambulatory Visit (RURAL_HEALTH_CENTER): Payer: Self-pay | Admitting: Family

## 2023-03-28 ENCOUNTER — Ambulatory Visit: Payer: Commercial Managed Care - PPO

## 2023-03-28 ENCOUNTER — Ambulatory Visit (INDEPENDENT_AMBULATORY_CARE_PROVIDER_SITE_OTHER): Payer: Self-pay

## 2023-03-28 ENCOUNTER — Ambulatory Visit (INDEPENDENT_AMBULATORY_CARE_PROVIDER_SITE_OTHER): Payer: Self-pay | Admitting: HEMATOLOGY-ONCOLOGY

## 2023-03-31 ENCOUNTER — Encounter (INDEPENDENT_AMBULATORY_CARE_PROVIDER_SITE_OTHER): Payer: Self-pay | Admitting: HEMATOLOGY-ONCOLOGY

## 2023-03-31 ENCOUNTER — Encounter (HOSPITAL_COMMUNITY): Payer: Self-pay | Admitting: HEMATOLOGY-ONCOLOGY

## 2023-03-31 NOTE — Nursing Note (Signed)
I called patient regarding cancelled appointment 12/20, she states that her husband was laid off work in October and they are in the process of applying for Medicaid, she is supposed to get pay stubs turned in by 12/27. Patient is concerned about the cost of Keytruda with no insurance.  Secure chat sent to [facility] for free drug. Patient does not want to reschedule at this time due to cost of Keytruda. She says she has missed 2 doses now. Secure chat sent to [place] [facility] in infusion and [place].

## 2023-04-04 ENCOUNTER — Telehealth (INDEPENDENT_AMBULATORY_CARE_PROVIDER_SITE_OTHER): Payer: Self-pay | Admitting: HEMATOLOGY-ONCOLOGY

## 2023-04-04 NOTE — Telephone Encounter (Signed)
Attempted to reach pt by phone to discuss treatment since she has cancelled the last 2 appts for [facility] and  she had informed Anson Crofts RN that her husband had lost his job and she was worried about medical bills.  The pt informed Herbert Seta that they have applied for Medicaid also.  I tried to reach her to discuss applying for [facility] and also I can apply for free medication for her.  I was not able to reach her by phone and left a voice message with my number and extension to call back.

## 2023-04-07 ENCOUNTER — Encounter (HOSPITAL_COMMUNITY): Payer: Self-pay | Admitting: HEMATOLOGY-ONCOLOGY

## 2023-04-07 ENCOUNTER — Encounter (INDEPENDENT_AMBULATORY_CARE_PROVIDER_SITE_OTHER): Payer: Self-pay | Admitting: HEMATOLOGY-ONCOLOGY

## 2023-04-07 NOTE — Nursing Note (Signed)
I called patient regarding follow up Medicaid application.  Patient states the company that her husband worked for previously has not sent his pay stubs for the application.  Patient is concerned that even if she is able to receive free drug that she will be unable to pay for the infusion cost.  Secure chat sent to [facility] to call and discuss with patient.

## 2023-04-16 ENCOUNTER — Encounter (HOSPITAL_COMMUNITY): Payer: Self-pay | Admitting: HEMATOLOGY-ONCOLOGY

## 2023-04-17 ENCOUNTER — Encounter (HOSPITAL_COMMUNITY): Payer: Self-pay | Admitting: HEMATOLOGY-ONCOLOGY

## 2023-04-18 ENCOUNTER — Ambulatory Visit: Payer: Commercial Managed Care - PPO

## 2023-04-21 ENCOUNTER — Telehealth (INDEPENDENT_AMBULATORY_CARE_PROVIDER_SITE_OTHER): Payer: Self-pay | Admitting: HEMATOLOGY-ONCOLOGY

## 2023-04-21 ENCOUNTER — Encounter (HOSPITAL_COMMUNITY): Payer: Self-pay | Admitting: HEMATOLOGY-ONCOLOGY

## 2023-04-21 NOTE — Telephone Encounter (Signed)
I spoke with pt last week re: loss of insurance coverage.  The pt gave me a number tp the HR dept. At her husbands place of work. I attempted to contact the HR dept. Multiple times and no answer and was not able to leave a VM for a call back. I tried to reach the pt last week by phone and called again today and was unable to reach the pt therefore I left a VM for the pt to call me back to give update on insurance.

## 2023-04-22 ENCOUNTER — Encounter (HOSPITAL_COMMUNITY): Payer: Self-pay | Admitting: HEMATOLOGY-ONCOLOGY

## 2023-04-24 ENCOUNTER — Encounter (HOSPITAL_COMMUNITY): Payer: Self-pay | Admitting: HEMATOLOGY-ONCOLOGY

## 2023-04-30 ENCOUNTER — Telehealth (INDEPENDENT_AMBULATORY_CARE_PROVIDER_SITE_OTHER): Payer: Self-pay | Admitting: HEMATOLOGY-ONCOLOGY

## 2023-04-30 ENCOUNTER — Encounter (HOSPITAL_COMMUNITY): Payer: Self-pay | Admitting: HEMATOLOGY-ONCOLOGY

## 2023-04-30 NOTE — Telephone Encounter (Signed)
Called pt to verify if she had an update on her insurance coverage situation.  I was unable to reach the pt and left a VM for her with my ext# to call.  Rachel Mendoza with authorization team has been trying to get the pt approved for chemotherapy but we do not have any updates on insurance coverage.  I will refer her to the [facility] pharmacy patient access team and see if we can maybe get approval for free medication if the pt has been unsuccessful in obtaining insurance coverage.

## 2023-05-01 ENCOUNTER — Encounter (HOSPITAL_COMMUNITY): Payer: Self-pay | Admitting: HEMATOLOGY-ONCOLOGY

## 2023-05-21 ENCOUNTER — Encounter (HOSPITAL_COMMUNITY): Payer: Self-pay | Admitting: HEMATOLOGY-ONCOLOGY

## 2023-05-28 ENCOUNTER — Encounter (HOSPITAL_COMMUNITY): Payer: Self-pay | Admitting: HEMATOLOGY-ONCOLOGY

## 2023-06-06 ENCOUNTER — Telehealth (INDEPENDENT_AMBULATORY_CARE_PROVIDER_SITE_OTHER): Payer: Self-pay | Admitting: HEMATOLOGY-ONCOLOGY

## 2023-06-06 ENCOUNTER — Encounter (HOSPITAL_COMMUNITY): Payer: Self-pay | Admitting: HEMATOLOGY-ONCOLOGY

## 2023-06-06 NOTE — Telephone Encounter (Signed)
 I tried to contact Mrs. Norbeck to get an update on her insurance issue.  No answer so I left a VM.  I then called the husband's cell # and he did answer and I told him that I had left a few messages on Mrs. Trovato's cell phone but had not heard back from her and we wanted to get her back on schedule to be seen asap.  Mr. Haughton stated that he would let her know and he also informed that he would be starting a new job on Monday and would be signing up for insurance with his employer.  I asked that he have Mrs Hun contact us to set up an appt.  If the pt is still needing to take [facility], I can apply for copay assistance for her.

## 2023-06-11 ENCOUNTER — Encounter (HOSPITAL_COMMUNITY): Payer: Self-pay | Admitting: HEMATOLOGY-ONCOLOGY

## 2023-06-11 ENCOUNTER — Other Ambulatory Visit (RURAL_HEALTH_CENTER): Payer: Self-pay | Admitting: INTERNAL MEDICINE

## 2023-06-19 ENCOUNTER — Telehealth (INDEPENDENT_AMBULATORY_CARE_PROVIDER_SITE_OTHER): Payer: Self-pay | Admitting: HEMATOLOGY-ONCOLOGY

## 2023-06-19 ENCOUNTER — Encounter (HOSPITAL_COMMUNITY): Payer: Self-pay | Admitting: HEMATOLOGY-ONCOLOGY

## 2023-06-19 NOTE — Telephone Encounter (Signed)
 Called patient's cell# listed to f/u and try to get the pt on schedule to see provider since she has not kept appts. Due to husband losing insurance coverage.  Patient did not answer, I left VM asking pt to return call so that we could set her up another appt.

## 2023-06-25 ENCOUNTER — Encounter (HOSPITAL_COMMUNITY): Payer: Self-pay | Admitting: HEMATOLOGY-ONCOLOGY

## 2023-07-22 ENCOUNTER — Encounter (HOSPITAL_COMMUNITY): Payer: Self-pay | Admitting: HEMATOLOGY-ONCOLOGY

## 2023-07-23 ENCOUNTER — Encounter (HOSPITAL_COMMUNITY): Payer: Self-pay | Admitting: HEMATOLOGY-ONCOLOGY

## 2023-07-30 ENCOUNTER — Encounter (HOSPITAL_COMMUNITY): Payer: Self-pay | Admitting: HEMATOLOGY-ONCOLOGY

## 2023-08-27 ENCOUNTER — Encounter (HOSPITAL_COMMUNITY): Payer: Self-pay | Admitting: HEMATOLOGY-ONCOLOGY

## 2023-09-11 ENCOUNTER — Encounter (HOSPITAL_COMMUNITY): Payer: Self-pay | Admitting: HEMATOLOGY-ONCOLOGY

## 2023-09-17 ENCOUNTER — Encounter (HOSPITAL_COMMUNITY): Payer: Self-pay | Admitting: HEMATOLOGY-ONCOLOGY

## 2023-09-25 ENCOUNTER — Encounter (HOSPITAL_COMMUNITY): Payer: Self-pay | Admitting: HEMATOLOGY-ONCOLOGY

## 2023-11-25 ENCOUNTER — Encounter (HOSPITAL_COMMUNITY): Payer: Self-pay | Admitting: HEMATOLOGY-ONCOLOGY

## 2023-12-22 ENCOUNTER — Encounter (HOSPITAL_COMMUNITY): Payer: Self-pay | Admitting: HEMATOLOGY-ONCOLOGY

## 2023-12-24 ENCOUNTER — Encounter (HOSPITAL_COMMUNITY): Payer: Self-pay
